# Patient Record
Sex: Male | Born: 1980 | Race: Black or African American | Hispanic: No | Marital: Single | Smoking: Current every day smoker
Health system: Southern US, Community
[De-identification: ages and names within clinical notes are randomized; demographics above are authoritative.]

## PROBLEM LIST (undated history)

## (undated) DIAGNOSIS — F329 Major depressive disorder, single episode, unspecified: Secondary | ICD-10-CM

## (undated) DIAGNOSIS — D649 Anemia, unspecified: Secondary | ICD-10-CM

## (undated) DIAGNOSIS — F32A Depression, unspecified: Secondary | ICD-10-CM

## (undated) DIAGNOSIS — F603 Borderline personality disorder: Secondary | ICD-10-CM

## (undated) DIAGNOSIS — S31119A Laceration without foreign body of abdominal wall, unspecified quadrant without penetration into peritoneal cavity, initial encounter: Secondary | ICD-10-CM

## (undated) DIAGNOSIS — Z87821 Personal history of retained foreign body fully removed: Secondary | ICD-10-CM

## (undated) DIAGNOSIS — F1410 Cocaine abuse, uncomplicated: Secondary | ICD-10-CM

## (undated) DIAGNOSIS — Z9289 Personal history of other medical treatment: Secondary | ICD-10-CM

## (undated) DIAGNOSIS — F419 Anxiety disorder, unspecified: Secondary | ICD-10-CM

## (undated) HISTORY — PX: FOREIGN BODY REMOVAL: SHX962

---
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
## 2009-06-19 HISTORY — PX: EXPLORATORY LAPAROTOMY: SUR591

## 2009-06-19 HISTORY — PX: GASTROTOMY: SHX61

## 2009-06-21 ENCOUNTER — Emergency Department (HOSPITAL_COMMUNITY): Admission: EM | Admit: 2009-06-21 | Discharge: 2009-06-21 | Payer: Self-pay | Admitting: Emergency Medicine

## 2009-08-03 ENCOUNTER — Emergency Department (HOSPITAL_COMMUNITY): Admission: EM | Admit: 2009-08-03 | Discharge: 2009-08-03 | Payer: Self-pay | Admitting: Emergency Medicine

## 2009-08-28 ENCOUNTER — Emergency Department (HOSPITAL_COMMUNITY): Admission: EM | Admit: 2009-08-28 | Discharge: 2009-08-30 | Payer: Self-pay | Admitting: Emergency Medicine

## 2009-08-30 ENCOUNTER — Inpatient Hospital Stay (HOSPITAL_COMMUNITY): Admission: RE | Admit: 2009-08-30 | Discharge: 2009-09-07 | Payer: Self-pay | Admitting: Psychiatry

## 2009-08-30 ENCOUNTER — Ambulatory Visit: Payer: Self-pay | Admitting: Psychiatry

## 2009-10-02 ENCOUNTER — Emergency Department (HOSPITAL_COMMUNITY): Admission: EM | Admit: 2009-10-02 | Discharge: 2009-10-04 | Payer: Self-pay | Admitting: Emergency Medicine

## 2009-11-16 ENCOUNTER — Emergency Department (HOSPITAL_COMMUNITY): Admission: EM | Admit: 2009-11-16 | Discharge: 2009-11-16 | Payer: Self-pay | Admitting: Emergency Medicine

## 2010-03-31 ENCOUNTER — Inpatient Hospital Stay (HOSPITAL_COMMUNITY): Admission: EM | Admit: 2010-03-31 | Discharge: 2010-04-03 | Payer: Self-pay | Admitting: Emergency Medicine

## 2010-04-11 ENCOUNTER — Inpatient Hospital Stay (HOSPITAL_COMMUNITY): Admission: EM | Admit: 2010-04-11 | Discharge: 2010-04-18 | Payer: Self-pay | Admitting: Emergency Medicine

## 2010-05-10 ENCOUNTER — Ambulatory Visit: Payer: Self-pay | Admitting: Psychiatry

## 2010-05-14 ENCOUNTER — Ambulatory Visit: Payer: Self-pay | Admitting: Psychiatry

## 2010-08-31 LAB — BASIC METABOLIC PANEL
BUN: 11 mg/dL (ref 6–23)
BUN: 12 mg/dL (ref 6–23)
BUN: 2 mg/dL — ABNORMAL LOW (ref 6–23)
BUN: 3 mg/dL — ABNORMAL LOW (ref 6–23)
BUN: 8 mg/dL (ref 6–23)
CO2: 27 mEq/L (ref 19–32)
CO2: 28 mEq/L (ref 19–32)
CO2: 29 mEq/L (ref 19–32)
CO2: 33 mEq/L — ABNORMAL HIGH (ref 19–32)
CO2: 33 mEq/L — ABNORMAL HIGH (ref 19–32)
Calcium: 8.8 mg/dL (ref 8.4–10.5)
Calcium: 9 mg/dL (ref 8.4–10.5)
Calcium: 9.1 mg/dL (ref 8.4–10.5)
Calcium: 9.1 mg/dL (ref 8.4–10.5)
Calcium: 9.3 mg/dL (ref 8.4–10.5)
Chloride: 101 mEq/L (ref 96–112)
Chloride: 103 mEq/L (ref 96–112)
Chloride: 104 mEq/L (ref 96–112)
Chloride: 105 mEq/L (ref 96–112)
Chloride: 98 mEq/L (ref 96–112)
Creatinine, Ser: 0.87 mg/dL (ref 0.4–1.5)
Creatinine, Ser: 1.11 mg/dL (ref 0.4–1.5)
Creatinine, Ser: 1.11 mg/dL (ref 0.4–1.5)
Creatinine, Ser: 1.18 mg/dL (ref 0.4–1.5)
Creatinine, Ser: 1.26 mg/dL (ref 0.4–1.5)
GFR calc Af Amer: >60 mL/min (ref >60)
GFR calc Af Amer: >60 mL/min (ref >60)
GFR calc Af Amer: >60 mL/min (ref >60)
GFR calc Af Amer: >60 mL/min (ref >60)
GFR calc Af Amer: >60 mL/min (ref >60)
GFR calc non Af Amer: >60 mL/min (ref >60)
GFR calc non Af Amer: >60 mL/min (ref >60)
GFR calc non Af Amer: >60 mL/min (ref >60)
GFR calc non Af Amer: >60 mL/min (ref >60)
GFR calc non Af Amer: >60 mL/min (ref >60)
Glucose, Bld: 103 mg/dL — ABNORMAL HIGH (ref 70–99)
Glucose, Bld: 104 mg/dL — ABNORMAL HIGH (ref 70–99)
Glucose, Bld: 116 mg/dL — ABNORMAL HIGH (ref 70–99)
Glucose, Bld: 92 mg/dL (ref 70–99)
Glucose, Bld: 98 mg/dL (ref 70–99)
Potassium: 3.8 mEq/L (ref 3.5–5.1)
Potassium: 4.3 mEq/L (ref 3.5–5.1)
Potassium: 4.3 mEq/L (ref 3.5–5.1)
Potassium: 4.4 mEq/L (ref 3.5–5.1)
Potassium: 4.5 mEq/L (ref 3.5–5.1)
Sodium: 137 mEq/L (ref 135–145)
Sodium: 137 mEq/L (ref 135–145)
Sodium: 138 mEq/L (ref 135–145)
Sodium: 139 mEq/L (ref 135–145)
Sodium: 141 mEq/L (ref 135–145)

## 2010-08-31 LAB — CBC
HCT: 35.9 % — ABNORMAL LOW (ref 39.0–52.0)
HCT: 42 % (ref 39.0–52.0)
HCT: 45.2 % (ref 39.0–52.0)
HCT: 45.2 % (ref 39.0–52.0)
HCT: 45.4 % (ref 39.0–52.0)
Hemoglobin: 12.2 g/dL — ABNORMAL LOW (ref 13.0–17.0)
Hemoglobin: 14.1 g/dL (ref 13.0–17.0)
Hemoglobin: 15 g/dL (ref 13.0–17.0)
Hemoglobin: 15.4 g/dL (ref 13.0–17.0)
Hemoglobin: 15.5 g/dL (ref 13.0–17.0)
MCH: 30.4 pg (ref 26.0–34.0)
MCH: 30.7 pg (ref 26.0–34.0)
MCH: 30.9 pg (ref 26.0–34.0)
MCH: 30.9 pg (ref 26.0–34.0)
MCH: 30.9 pg (ref 26.0–34.0)
MCHC: 33.2 g/dL (ref 30.0–36.0)
MCHC: 33.6 g/dL (ref 30.0–36.0)
MCHC: 34 g/dL (ref 30.0–36.0)
MCHC: 34.1 g/dL (ref 30.0–36.0)
MCHC: 34.1 g/dL (ref 30.0–36.0)
MCV: 90.2 fL (ref 78.0–100.0)
MCV: 90.6 fL (ref 78.0–100.0)
MCV: 90.8 fL (ref 78.0–100.0)
MCV: 91.5 fL (ref 78.0–100.0)
MCV: 91.9 fL (ref 78.0–100.0)
Platelets: 178 10*3/uL (ref 150–400)
Platelets: 186 10*3/uL (ref 150–400)
Platelets: 187 10*3/uL (ref 150–400)
Platelets: 192 10*3/uL (ref 150–400)
Platelets: 220 10*3/uL (ref 150–400)
RBC: 3.98 MIL/uL — ABNORMAL LOW (ref 4.22–5.81)
RBC: 4.57 MIL/uL (ref 4.22–5.81)
RBC: 4.94 MIL/uL (ref 4.22–5.81)
RBC: 4.98 MIL/uL (ref 4.22–5.81)
RBC: 5.01 MIL/uL (ref 4.22–5.81)
RDW: 12.3 % (ref 11.5–15.5)
RDW: 12.3 % (ref 11.5–15.5)
RDW: 12.3 % (ref 11.5–15.5)
RDW: 12.5 % (ref 11.5–15.5)
RDW: 12.6 % (ref 11.5–15.5)
WBC: 13.6 10*3/uL — ABNORMAL HIGH (ref 4.0–10.5)
WBC: 23.3 10*3/uL — ABNORMAL HIGH (ref 4.0–10.5)
WBC: 5.5 10*3/uL (ref 4.0–10.5)
WBC: 7.4 10*3/uL (ref 4.0–10.5)
WBC: 9.9 10*3/uL (ref 4.0–10.5)

## 2010-08-31 LAB — GLUCOSE, CAPILLARY: Glucose-Capillary: 120 mg/dL — ABNORMAL HIGH (ref 70–99)

## 2010-08-31 LAB — DIFFERENTIAL
Basophils Absolute: 0 10*3/uL (ref 0.0–0.1)
Basophils Relative: 0 % (ref 0–1)
Eosinophils Absolute: 0.1 10*3/uL (ref 0.0–0.7)
Eosinophils Relative: 1 % (ref 0–5)
Lymphocytes Relative: 16 % (ref 12–46)
Lymphs Abs: 1.6 10*3/uL (ref 0.7–4.0)
Monocytes Absolute: 0.8 10*3/uL (ref 0.1–1.0)
Monocytes Relative: 8 % (ref 3–12)
Neutro Abs: 7.5 10*3/uL (ref 1.7–7.7)
Neutrophils Relative %: 75 % (ref 43–77)

## 2010-08-31 LAB — MAGNESIUM
Magnesium: 1.9 mg/dL (ref 1.5–2.5)
Magnesium: 2 mg/dL (ref 1.5–2.5)

## 2010-08-31 LAB — MRSA PCR SCREENING: MRSA by PCR: NEGATIVE

## 2010-08-31 LAB — PHOSPHORUS: Phosphorus: 1.8 mg/dL — ABNORMAL LOW (ref 2.3–4.6)

## 2010-09-01 LAB — DIFFERENTIAL
Band Neutrophils: 0 % (ref 0–10)
Basophils Absolute: 0 10*3/uL (ref 0.0–0.1)
Basophils Relative: 0 % (ref 0–1)
Eosinophils Absolute: 0.2 10*3/uL (ref 0.0–0.7)
Eosinophils Relative: 2 % (ref 0–5)
Metamyelocytes Relative: 0 %
Myelocytes: 0 %
Promyelocytes Absolute: 0 %

## 2010-09-01 LAB — SAMPLE TO BLOOD BANK

## 2010-09-01 LAB — BASIC METABOLIC PANEL
CO2: 31 mEq/L (ref 19–32)
Chloride: 104 mEq/L (ref 96–112)
GFR calc Af Amer: >60 mL/min (ref >60)
Glucose, Bld: 93 mg/dL (ref 70–99)
Sodium: 143 mEq/L (ref 135–145)

## 2010-09-01 LAB — CBC
Hemoglobin: 16.2 g/dL (ref 13.0–17.0)
MCH: 30.7 pg (ref 26.0–34.0)
MCHC: 33.8 g/dL (ref 30.0–36.0)
MCV: 90.7 fL (ref 78.0–100.0)
RBC: 5.28 MIL/uL (ref 4.22–5.81)

## 2010-09-01 LAB — RAPID URINE DRUG SCREEN, HOSP PERFORMED
Amphetamines: NOT DETECTED
Barbiturates: NOT DETECTED
Cocaine: NOT DETECTED
Opiates: NOT DETECTED
Tetrahydrocannabinol: NOT DETECTED

## 2010-09-01 LAB — POCT I-STAT, CHEM 8
Glucose, Bld: 94 mg/dL (ref 70–99)
HCT: 52 % (ref 39.0–52.0)
Hemoglobin: 17.7 g/dL — ABNORMAL HIGH (ref 13.0–17.0)
Potassium: 4.1 mEq/L (ref 3.5–5.1)
Sodium: 143 mEq/L (ref 135–145)

## 2010-09-01 LAB — ABO/RH: ABO/RH(D): O POS

## 2010-09-06 LAB — RAPID URINE DRUG SCREEN, HOSP PERFORMED
Amphetamines: NOT DETECTED
Benzodiazepines: NOT DETECTED
Cocaine: POSITIVE — AB
Opiates: NOT DETECTED
Tetrahydrocannabinol: POSITIVE — AB

## 2010-09-06 LAB — DIFFERENTIAL
Basophils Absolute: 0 10*3/uL (ref 0.0–0.1)
Basophils Relative: 0 % (ref 0–1)
Eosinophils Relative: 2 % (ref 0–5)
Monocytes Absolute: 1.1 10*3/uL — ABNORMAL HIGH (ref 0.1–1.0)
Neutro Abs: 7.3 10*3/uL (ref 1.7–7.7)

## 2010-09-06 LAB — CBC
Hemoglobin: 14.5 g/dL (ref 13.0–17.0)
MCHC: 34.9 g/dL (ref 30.0–36.0)
RDW: 12.8 % (ref 11.5–15.5)

## 2010-09-06 LAB — URINALYSIS, ROUTINE W REFLEX MICROSCOPIC
Bilirubin Urine: NEGATIVE
Ketones, ur: NEGATIVE mg/dL
Nitrite: NEGATIVE
Specific Gravity, Urine: 1.024 (ref 1.005–1.030)
Urobilinogen, UA: 1 mg/dL (ref 0.0–1.0)

## 2010-09-06 LAB — BASIC METABOLIC PANEL
BUN: 6 mg/dL (ref 6–23)
CO2: 31 mEq/L (ref 19–32)
Calcium: 8.8 mg/dL (ref 8.4–10.5)
Glucose, Bld: 91 mg/dL (ref 70–99)
Sodium: 138 mEq/L (ref 135–145)

## 2010-09-08 LAB — URINALYSIS, ROUTINE W REFLEX MICROSCOPIC
Bilirubin Urine: NEGATIVE
Hgb urine dipstick: NEGATIVE
Protein, ur: NEGATIVE mg/dL
Urobilinogen, UA: 1 mg/dL (ref 0.0–1.0)

## 2010-09-08 LAB — GC/CHLAMYDIA PROBE AMP, GENITAL: Chlamydia, DNA Probe: NEGATIVE

## 2010-09-08 LAB — URINE MICROSCOPIC-ADD ON

## 2010-09-12 LAB — COMPREHENSIVE METABOLIC PANEL
ALT: 23 U/L (ref 0–53)
Calcium: 9 mg/dL (ref 8.4–10.5)
GFR calc Af Amer: >60 mL/min (ref >60)
Glucose, Bld: 111 mg/dL — ABNORMAL HIGH (ref 70–99)
Sodium: 135 mEq/L (ref 135–145)
Total Protein: 7.2 g/dL (ref 6.0–8.3)

## 2010-09-12 LAB — RAPID URINE DRUG SCREEN, HOSP PERFORMED
Barbiturates: NOT DETECTED
Cocaine: POSITIVE — AB
Opiates: NOT DETECTED

## 2010-09-12 LAB — HEPATIC FUNCTION PANEL
ALT: 19 U/L (ref 0–53)
AST: 23 U/L (ref 0–37)
Albumin: 4.7 g/dL (ref 3.5–5.2)
Alkaline Phosphatase: 94 U/L (ref 39–117)
Total Bilirubin: 0.5 mg/dL (ref 0.3–1.2)
Total Protein: 7.4 g/dL (ref 6.0–8.3)

## 2010-09-12 LAB — SALICYLATE LEVEL: Salicylate Lvl: <4 mg/dL (ref 2.8–20.0)

## 2010-09-12 LAB — ACETAMINOPHEN LEVEL: Acetaminophen (Tylenol), Serum: 84.3 ug/mL — ABNORMAL HIGH (ref 10–30)

## 2010-10-18 DIAGNOSIS — Z9289 Personal history of other medical treatment: Secondary | ICD-10-CM

## 2010-10-18 HISTORY — DX: Personal history of other medical treatment: Z92.89

## 2010-12-13 ENCOUNTER — Emergency Department (HOSPITAL_BASED_OUTPATIENT_CLINIC_OR_DEPARTMENT_OTHER)
Admission: EM | Admit: 2010-12-13 | Discharge: 2010-12-14 | Disposition: A | Discharge status: Home / Self Care | Attending: Emergency Medicine | Admitting: Emergency Medicine

## 2010-12-13 DIAGNOSIS — T189XXA Foreign body of alimentary tract, part unspecified, initial encounter: Secondary | ICD-10-CM | POA: Insufficient documentation

## 2010-12-13 DIAGNOSIS — Y9210 Unspecified residential institution as the place of occurrence of the external cause: Secondary | ICD-10-CM | POA: Insufficient documentation

## 2010-12-13 DIAGNOSIS — F17200 Nicotine dependence, unspecified, uncomplicated: Secondary | ICD-10-CM | POA: Insufficient documentation

## 2010-12-13 DIAGNOSIS — IMO0002 Reserved for concepts with insufficient information to code with codable children: Secondary | ICD-10-CM | POA: Insufficient documentation

## 2010-12-14 ENCOUNTER — Emergency Department (INDEPENDENT_AMBULATORY_CARE_PROVIDER_SITE_OTHER)

## 2010-12-14 DIAGNOSIS — T182XXA Foreign body in stomach, initial encounter: Secondary | ICD-10-CM

## 2010-12-14 DIAGNOSIS — IMO0002 Reserved for concepts with insufficient information to code with codable children: Secondary | ICD-10-CM

## 2018-03-30 ENCOUNTER — Encounter (HOSPITAL_COMMUNITY): Payer: Self-pay | Admitting: Emergency Medicine

## 2018-03-30 ENCOUNTER — Emergency Department (HOSPITAL_COMMUNITY): Payer: Self-pay

## 2018-03-30 ENCOUNTER — Other Ambulatory Visit: Payer: Self-pay

## 2018-03-30 ENCOUNTER — Inpatient Hospital Stay (HOSPITAL_COMMUNITY)
Admission: EM | Admit: 2018-03-30 | Discharge: 2018-04-12 | DRG: 917 | Disposition: A | Discharge status: Home / Self Care | Payer: Self-pay | Attending: Internal Medicine | Admitting: Internal Medicine

## 2018-03-30 DIAGNOSIS — R402222 Coma scale, best verbal response, incomprehensible words, at arrival to emergency department: Secondary | ICD-10-CM | POA: Diagnosis present

## 2018-03-30 DIAGNOSIS — G92 Toxic encephalopathy: Secondary | ICD-10-CM | POA: Diagnosis present

## 2018-03-30 DIAGNOSIS — X58XXXA Exposure to other specified factors, initial encounter: Secondary | ICD-10-CM | POA: Diagnosis present

## 2018-03-30 DIAGNOSIS — R402112 Coma scale, eyes open, never, at arrival to emergency department: Secondary | ICD-10-CM | POA: Diagnosis present

## 2018-03-30 DIAGNOSIS — Y92009 Unspecified place in unspecified non-institutional (private) residence as the place of occurrence of the external cause: Secondary | ICD-10-CM

## 2018-03-30 DIAGNOSIS — Z915 Personal history of self-harm: Secondary | ICD-10-CM

## 2018-03-30 DIAGNOSIS — F17210 Nicotine dependence, cigarettes, uncomplicated: Secondary | ICD-10-CM | POA: Diagnosis present

## 2018-03-30 DIAGNOSIS — H5704 Mydriasis: Secondary | ICD-10-CM | POA: Diagnosis present

## 2018-03-30 DIAGNOSIS — Z888 Allergy status to other drugs, medicaments and biological substances status: Secondary | ICD-10-CM

## 2018-03-30 DIAGNOSIS — F332 Major depressive disorder, recurrent severe without psychotic features: Secondary | ICD-10-CM | POA: Diagnosis present

## 2018-03-30 DIAGNOSIS — T183XXA Foreign body in small intestine, initial encounter: Secondary | ICD-10-CM | POA: Diagnosis present

## 2018-03-30 DIAGNOSIS — F419 Anxiety disorder, unspecified: Secondary | ICD-10-CM | POA: Diagnosis present

## 2018-03-30 DIAGNOSIS — G4700 Insomnia, unspecified: Secondary | ICD-10-CM | POA: Diagnosis present

## 2018-03-30 DIAGNOSIS — Z653 Problems related to other legal circumstances: Secondary | ICD-10-CM

## 2018-03-30 DIAGNOSIS — G9081 Serotonin syndrome: Secondary | ICD-10-CM | POA: Diagnosis present

## 2018-03-30 DIAGNOSIS — G2579 Other drug induced movement disorders: Secondary | ICD-10-CM | POA: Diagnosis present

## 2018-03-30 DIAGNOSIS — T426X2A Poisoning by other antiepileptic and sedative-hypnotic drugs, intentional self-harm, initial encounter: Secondary | ICD-10-CM | POA: Diagnosis present

## 2018-03-30 DIAGNOSIS — F1010 Alcohol abuse, uncomplicated: Secondary | ICD-10-CM | POA: Diagnosis present

## 2018-03-30 DIAGNOSIS — Z79899 Other long term (current) drug therapy: Secondary | ICD-10-CM

## 2018-03-30 DIAGNOSIS — T189XXA Foreign body of alimentary tract, part unspecified, initial encounter: Secondary | ICD-10-CM

## 2018-03-30 DIAGNOSIS — F1410 Cocaine abuse, uncomplicated: Secondary | ICD-10-CM | POA: Diagnosis present

## 2018-03-30 DIAGNOSIS — R402362 Coma scale, best motor response, obeys commands, at arrival to emergency department: Secondary | ICD-10-CM | POA: Diagnosis present

## 2018-03-30 DIAGNOSIS — R502 Drug induced fever: Secondary | ICD-10-CM | POA: Diagnosis present

## 2018-03-30 DIAGNOSIS — T50902A Poisoning by unspecified drugs, medicaments and biological substances, intentional self-harm, initial encounter: Secondary | ICD-10-CM | POA: Diagnosis present

## 2018-03-30 DIAGNOSIS — K210 Gastro-esophageal reflux disease with esophagitis: Secondary | ICD-10-CM | POA: Diagnosis present

## 2018-03-30 DIAGNOSIS — I10 Essential (primary) hypertension: Secondary | ICD-10-CM | POA: Diagnosis present

## 2018-03-30 DIAGNOSIS — N179 Acute kidney failure, unspecified: Secondary | ICD-10-CM | POA: Diagnosis present

## 2018-03-30 DIAGNOSIS — R61 Generalized hyperhidrosis: Secondary | ICD-10-CM | POA: Diagnosis present

## 2018-03-30 DIAGNOSIS — T43212A Poisoning by selective serotonin and norepinephrine reuptake inhibitors, intentional self-harm, initial encounter: Principal | ICD-10-CM | POA: Diagnosis present

## 2018-03-30 HISTORY — DX: Cocaine abuse, uncomplicated: F14.10

## 2018-03-30 LAB — I-STAT CHEM 8, ED
BUN: 6 mg/dL (ref 6–20)
CHLORIDE: 103 mmol/L (ref 98–111)
Calcium, Ion: 1.12 mmol/L — ABNORMAL LOW (ref 1.15–1.40)
Creatinine, Ser: 1.2 mg/dL (ref 0.61–1.24)
Glucose, Bld: 111 mg/dL — ABNORMAL HIGH (ref 70–99)
HEMATOCRIT: 47 % (ref 39.0–52.0)
Hemoglobin: 16 g/dL (ref 13.0–17.0)
POTASSIUM: 3.9 mmol/L (ref 3.5–5.1)
Sodium: 140 mmol/L (ref 135–145)
TCO2: 26 mmol/L (ref 22–32)

## 2018-03-30 LAB — I-STAT ARTERIAL BLOOD GAS, ED
ACID-BASE EXCESS: 1 mmol/L (ref 0.0–2.0)
BICARBONATE: 26.2 mmol/L (ref 20.0–28.0)
O2 Saturation: 93 %
PH ART: 7.357 (ref 7.350–7.450)
Patient temperature: 101.1
TCO2: 28 mmol/L (ref 22–32)
pCO2 arterial: 47.3 mmHg (ref 32.0–48.0)
pO2, Arterial: 75 mmHg — ABNORMAL LOW (ref 83.0–108.0)

## 2018-03-30 LAB — CBC WITH DIFFERENTIAL/PLATELET
Abs Immature Granulocytes: 0.01 10*3/uL (ref 0.00–0.07)
BASOS PCT: 1 %
Basophils Absolute: 0 10*3/uL (ref 0.0–0.1)
EOS ABS: 0.1 10*3/uL (ref 0.0–0.5)
Eosinophils Relative: 2 %
HCT: 48.4 % (ref 39.0–52.0)
Hemoglobin: 14.7 g/dL (ref 13.0–17.0)
IMMATURE GRANULOCYTES: 0 %
Lymphocytes Relative: 15 %
Lymphs Abs: 1.1 10*3/uL (ref 0.7–4.0)
MCH: 26.8 pg (ref 26.0–34.0)
MCHC: 30.4 g/dL (ref 30.0–36.0)
MCV: 88.3 fL (ref 80.0–100.0)
MONO ABS: 0.5 10*3/uL (ref 0.1–1.0)
MONOS PCT: 7 %
NEUTROS PCT: 75 %
Neutro Abs: 5.4 10*3/uL (ref 1.7–7.7)
PLATELETS: 305 10*3/uL (ref 150–400)
RBC: 5.48 MIL/uL (ref 4.22–5.81)
RDW: 13.5 % (ref 11.5–15.5)
WBC: 7.2 10*3/uL (ref 4.0–10.5)
nRBC: 0 % (ref 0.0–0.2)

## 2018-03-30 LAB — COMPREHENSIVE METABOLIC PANEL
ALT: 20 U/L (ref 0–44)
AST: 24 U/L (ref 15–41)
Albumin: 4.3 g/dL (ref 3.5–5.0)
Alkaline Phosphatase: 67 U/L (ref 38–126)
Anion gap: 11 (ref 5–15)
BUN: 6 mg/dL (ref 6–20)
CHLORIDE: 104 mmol/L (ref 98–111)
CO2: 25 mmol/L (ref 22–32)
Calcium: 9.5 mg/dL (ref 8.9–10.3)
Creatinine, Ser: 1.28 mg/dL — ABNORMAL HIGH (ref 0.61–1.24)
Glucose, Bld: 108 mg/dL — ABNORMAL HIGH (ref 70–99)
POTASSIUM: 3.8 mmol/L (ref 3.5–5.1)
SODIUM: 140 mmol/L (ref 135–145)
Total Bilirubin: 0.8 mg/dL (ref 0.3–1.2)
Total Protein: 7.3 g/dL (ref 6.5–8.1)

## 2018-03-30 LAB — ETHANOL

## 2018-03-30 LAB — ACETAMINOPHEN LEVEL

## 2018-03-30 LAB — SALICYLATE LEVEL

## 2018-03-30 LAB — CBG MONITORING, ED: Glucose-Capillary: 108 mg/dL — ABNORMAL HIGH (ref 70–99)

## 2018-03-30 MED ORDER — LORAZEPAM 2 MG/ML IJ SOLN
INTRAMUSCULAR | Status: AC
  Administered 2018-03-30: 0.5 mg via INTRAVENOUS
  Filled 2018-03-30: qty 1

## 2018-03-30 MED ORDER — SODIUM CHLORIDE 0.9 % IV BOLUS
1000.0000 mL | Freq: Once | INTRAVENOUS | Status: AC
  Administered 2018-03-30: 1000 mL via INTRAVENOUS

## 2018-03-30 MED ORDER — LORAZEPAM 2 MG/ML IJ SOLN
0.5000 mg | Freq: Once | INTRAMUSCULAR | Status: AC
  Administered 2018-03-30: 0.5 mg via INTRAVENOUS

## 2018-03-30 MED ORDER — ONDANSETRON HCL 4 MG/2ML IJ SOLN
INTRAMUSCULAR | Status: AC
  Filled 2018-03-30: qty 2

## 2018-03-30 NOTE — ED Notes (Signed)
RT at bedside for ABG

## 2018-03-30 NOTE — ED Triage Notes (Signed)
Pt arrived [facility] from home for overdose. Per EMS family states that he took 20- 100mg  Lamictal, 6-150mg  effexor, and cocaine at approx 6am today. Pt began shaking an hour ago and family called EMS, Upon arrival pt is hyperventilating, diaphoretic, and responds to voice.  Vitals with EMS, 139/91    P 104 RR30   O2%97 NRB 20G IV L hand

## 2018-03-30 NOTE — ED Provider Notes (Addendum)
[facility] EMERGENCY DEPARTMENT Provider Note   CSN: [id number] Arrival date & time: 03/30/18  2250     History   Chief Complaint Chief Complaint  Patient presents with  . Drug Overdose    HPI David Mcconnell is a 37 y.o. male.  HPI   This is a 37 yo male with history of cocaine abuse who presents with a reported overdose.  Per EMS report, they were called out to the patient's home for a possible overdose.  Per EMS, family stated that the patient took 2100 mg Lamictal and 6 150 mg Effexor as well as cocaine at approximately 6 AM this morning.  He did make statements to his family that implicated intention for suicide.  1 hour prior to arrival, family reported shaking by the patient.  Upon arrival he was hyperventilating, diaphoretic.  He was responsive to voice.  Noted to be tachycardic but otherwise vital signs were largely reassuring.  Patient does not contribute to history taking.  He does follow simple commands.  When asked if he took anything else he just moans.  Level 5 caveat  Past Medical History:  Diagnosis Date  . Cocaine abuse (HCC)     There are no active problems to display for this patient.   History reviewed. No pertinent surgical history.      Home Medications    Prior to Admission medications   Not on File    Family History History reviewed. No pertinent family history.  Social History Social History   Tobacco Use  . Smoking status: Current Some Day Smoker  . Smokeless tobacco: Never Used  Substance Use Topics  . Alcohol use: Not Currently  . Drug use: Yes    Types: Cocaine     Allergies   Patient has no known allergies.   Review of Systems Review of Systems  Unable to perform ROS: Mental status change     Physical Exam Updated Vital Signs BP 118/79   Pulse 74   Temp 98.5 F (36.9 C) (Oral)   Resp 19   Ht 1.829 m (6')   Wt 99.8 kg   SpO2 96%   BMI 29.84 kg/m   Physical Exam  Constitutional: He  appears well-developed and well-nourished.  Ill-appearing, somnolent, minimally arousable  HENT:  Head: Normocephalic and atraumatic.  Eyes: Pupils are equal, round, and reactive to light.  Pupils 4 mm reactive bilaterally  Neck: Neck supple.  Cardiovascular: Normal rate, regular rhythm and normal heart sounds.  No murmur heard. Pulmonary/Chest: Effort normal and breath sounds normal. No respiratory distress. He has no wheezes.  Abdominal: Soft. Bowel sounds are normal. There is no tenderness. There is no rebound.  Extensive abdominal scarring  Musculoskeletal: He exhibits no edema.  Neurological: He is alert. GCS eye subscore is 1. GCS verbal subscore is 2. GCS motor subscore is 6.  Skin: Skin is warm. He is diaphoretic.  Psychiatric: He has a normal mood and affect.  Nursing note and vitals reviewed.    ED Treatments / Results  Labs (all labs ordered are listed, but only abnormal results are displayed) Labs Reviewed  COMPREHENSIVE METABOLIC PANEL - Abnormal; Notable for the following components:      Result Value   Glucose, Bld 108 (*)    Creatinine, Ser 1.28 (*)    All other components within normal limits  ACETAMINOPHEN LEVEL - Abnormal; Notable for the following components:   Acetaminophen (Tylenol), Serum <10 (*)    All other components  within normal limits  RAPID URINE DRUG SCREEN, HOSP PERFORMED - Abnormal; Notable for the following components:   Cocaine POSITIVE (*)    All other components within normal limits  I-STAT ARTERIAL BLOOD GAS, ED - Abnormal; Notable for the following components:   pO2, Arterial 75.0 (*)    All other components within normal limits  I-STAT CHEM 8, ED - Abnormal; Notable for the following components:   Glucose, Bld 111 (*)    Calcium, Ion 1.12 (*)    All other components within normal limits  CBG MONITORING, ED - Abnormal; Notable for the following components:   Glucose-Capillary 108 (*)    All other components within normal limits  CBC  WITH DIFFERENTIAL/PLATELET  SALICYLATE LEVEL  ETHANOL    EKG None  Radiology Dg Abdomen 1 View  Result Date: 03/30/2018 CLINICAL DATA:  Vomiting.  Drug overdose. EXAM: ABDOMEN - 1 VIEW COMPARISON:  04/11/2010 FINDINGS: No evidence of dilated bowel loops. No radiopaque calculi identified. A small linear radiodensity is seen in the right upper quadrant, which is of uncertain etiology. Ingested foreign body cannot be excluded. IMPRESSION: Small linear radiodensity in the right upper quadrant of uncertain etiology. Ingested foreign body cannot be excluded. Electronically Signed   By: Myles Rosenthal M.D.   On: 03/30/2018 23:35   Dg Chest Portable 1 View  Result Date: 03/30/2018 CLINICAL DATA:  Drug overdose.  Vomiting. EXAM: PORTABLE CHEST 1 VIEW COMPARISON:  04/11/2010 FINDINGS: The heart size and mediastinal contours are within normal limits. Both lungs are clear. The visualized skeletal structures are unremarkable. IMPRESSION: No active disease. Electronically Signed   By: Myles Rosenthal M.D.   On: 03/30/2018 23:33    Procedures Procedures (including critical care time)  CRITICAL CARE Performed by: Shon Baton   Total critical care time: 40 minutes  Critical care time was exclusive of separately billable procedures and treating other patients.  Critical care was necessary to treat or prevent imminent or life-threatening deterioration.  Critical care was time spent personally by me on the following activities: development of treatment plan with patient and/or surrogate as well as nursing, discussions with consultants, evaluation of patient's response to treatment, examination of patient, obtaining history from patient or surrogate, ordering and performing treatments and interventions, ordering and review of laboratory studies, ordering and review of radiographic studies, pulse oximetry and re-evaluation of patient's condition.   Medications Ordered in ED Medications  ondansetron  (ZOFRAN) 4 MG/2ML injection (has no administration in time range)  sodium chloride 0.9 % bolus 1,000 mL (0 mLs Intravenous Stopped 03/31/18 0050)  LORazepam (ATIVAN) injection 0.5 mg (0.5 mg Intravenous Given 03/30/18 2341)     Initial Impression / Assessment and Plan / ED Course  I have reviewed the triage vital signs and the nursing notes.  Pertinent labs & imaging results that were available during my care of the patient were reviewed by me and considered in my medical decision making (see chart for details).  Clinical Course as of Mar 31 721  Wynelle Link Mar 31, 2018  1610 More arousable.  Able to answer simple functions.  Will p.o. challenge and ambulate.  Will psychiatry evaluation.   [CH]    Clinical Course User Index [CH] Sadira Standard, Mayer Masker, MD    Patient presents with a overdose.  Reported at 6 AM yesterday morning.  He is minimally arousable.  He does follow some commands.  He is diaphoretic, tachycardic.  Patient was given a low-dose benzodiazepine as he appears to be  have some sympathetic drive.  Lab work-up is largely unremarkable.  Slight AKI.  Tylenol and salicylate levels are negative.  Poison control recommends 6-hour observation.  Patient was observed for greater than 8 hours.  On my repeat evaluation, he is more arousable.  He does not further endorse any ingestions.  He is answering questions appropriately but at times will moan and start shaking.  No actual seizure activity.  I have requested nursing ambulate him and give him something to eat for breakfast.  He is clinically cleared for TTS evaluation.  7:51 AM She is signed out to my colleague.  After my initial evaluation of waking him up, he is now again diaphoretic.  His vital signs however are fairly normal.  Repeat temperature 98.5.  Added head CT.  TTS consult will be deferred until fully cleared.  While he would certainly be at risk for serotonin syndrome, he is not showing the full toxidrome; however, he did remain  stable with a small dose of benzodiazepine overnight.  He may need admission if he does not clear.  Final Clinical Impressions(s) / ED Diagnoses   Final diagnoses:  Intentional drug overdose, initial encounter [facility])    ED Discharge Orders    None       Makynli Stills, Mayer Masker, MD 03/31/18 1610    Shon Baton, MD 03/31/18 9604    Shon Baton, MD 03/31/18 [phone]

## 2018-03-31 ENCOUNTER — Emergency Department (HOSPITAL_COMMUNITY): Payer: Self-pay

## 2018-03-31 ENCOUNTER — Other Ambulatory Visit: Payer: Self-pay

## 2018-03-31 DIAGNOSIS — X58XXXA Exposure to other specified factors, initial encounter: Secondary | ICD-10-CM

## 2018-03-31 DIAGNOSIS — X838XXA Intentional self-harm by other specified means, initial encounter: Secondary | ICD-10-CM

## 2018-03-31 DIAGNOSIS — T43212A Poisoning by selective serotonin and norepinephrine reuptake inhibitors, intentional self-harm, initial encounter: Principal | ICD-10-CM

## 2018-03-31 DIAGNOSIS — T189XXA Foreign body of alimentary tract, part unspecified, initial encounter: Secondary | ICD-10-CM

## 2018-03-31 DIAGNOSIS — F17200 Nicotine dependence, unspecified, uncomplicated: Secondary | ICD-10-CM

## 2018-03-31 DIAGNOSIS — G92 Toxic encephalopathy: Secondary | ICD-10-CM

## 2018-03-31 DIAGNOSIS — F1410 Cocaine abuse, uncomplicated: Secondary | ICD-10-CM

## 2018-03-31 DIAGNOSIS — Z915 Personal history of self-harm: Secondary | ICD-10-CM

## 2018-03-31 DIAGNOSIS — T50902A Poisoning by unspecified drugs, medicaments and biological substances, intentional self-harm, initial encounter: Secondary | ICD-10-CM | POA: Diagnosis present

## 2018-03-31 DIAGNOSIS — G2579 Other drug induced movement disorders: Secondary | ICD-10-CM | POA: Diagnosis present

## 2018-03-31 LAB — MAGNESIUM: Magnesium: 2.1 mg/dL (ref 1.7–2.4)

## 2018-03-31 LAB — RAPID URINE DRUG SCREEN, HOSP PERFORMED
Amphetamines: NOT DETECTED
BENZODIAZEPINES: NOT DETECTED
Barbiturates: NOT DETECTED
Cocaine: POSITIVE — AB
Opiates: NOT DETECTED
Tetrahydrocannabinol: NOT DETECTED

## 2018-03-31 LAB — I-STAT VENOUS BLOOD GAS, ED
ACID-BASE EXCESS: 4 mmol/L — AB (ref 0.0–2.0)
BICARBONATE: 29.9 mmol/L — AB (ref 20.0–28.0)
O2 SAT: 88 %
PCO2 VEN: 46.7 mmHg (ref 44.0–60.0)
PO2 VEN: 55 mmHg — AB (ref 32.0–45.0)
TCO2: 31 mmol/L (ref 22–32)
pH, Ven: 7.414 (ref 7.250–7.430)

## 2018-03-31 LAB — RAPID HIV SCREEN (HIV 1/2 AB+AG)
HIV 1/2 ANTIBODIES: NONREACTIVE
HIV-1 P24 ANTIGEN - HIV24: NONREACTIVE

## 2018-03-31 LAB — TSH: TSH: 0.351 u[IU]/mL (ref 0.350–4.500)

## 2018-03-31 LAB — CK: Total CK: 107 U/L (ref 49–397)

## 2018-03-31 LAB — PHOSPHORUS: Phosphorus: 3.8 mg/dL (ref 2.5–4.6)

## 2018-03-31 LAB — AMMONIA: Ammonia: 29 umol/L (ref 9–35)

## 2018-03-31 MED ORDER — ACETAMINOPHEN 650 MG RE SUPP: 650.0000 mg | Freq: Four times a day (QID) | RECTAL | Status: DC | PRN

## 2018-03-31 MED ORDER — ONDANSETRON HCL 4 MG PO TABS
4.0000 mg | ORAL_TABLET | Freq: Four times a day (QID) | ORAL | Status: DC | PRN
  Administered 2018-04-01: 4 mg via ORAL
  Filled 2018-03-31: qty 1

## 2018-03-31 MED ORDER — ACETAMINOPHEN 325 MG PO TABS
650.0000 mg | ORAL_TABLET | Freq: Four times a day (QID) | ORAL | Status: DC | PRN
  Administered 2018-04-08: 650 mg via ORAL
  Filled 2018-03-31: qty 2

## 2018-03-31 MED ORDER — LORAZEPAM 2 MG/ML IJ SOLN
1.0000 mg | Freq: Once | INTRAMUSCULAR | Status: AC
  Administered 2018-03-31: 1 mg via INTRAVENOUS
  Filled 2018-03-31: qty 1

## 2018-03-31 MED ORDER — SODIUM CHLORIDE 0.9 % IV BOLUS
1000.0000 mL | Freq: Once | INTRAVENOUS | Status: AC
  Administered 2018-03-31: 1000 mL via INTRAVENOUS

## 2018-03-31 MED ORDER — SODIUM CHLORIDE 0.9 % IV BOLUS (SEPSIS)
1000.0000 mL | Freq: Once | INTRAVENOUS | Status: AC
  Administered 2018-03-31: 1000 mL via INTRAVENOUS

## 2018-03-31 MED ORDER — ENOXAPARIN SODIUM 40 MG/0.4ML ~~LOC~~ SOLN
40.0000 mg | SUBCUTANEOUS | Status: DC
  Administered 2018-03-31 – 2018-04-08 (×9): 40 mg via SUBCUTANEOUS
  Filled 2018-03-31 (×9): qty 0.4

## 2018-03-31 MED ORDER — ONDANSETRON HCL 4 MG/2ML IJ SOLN
4.0000 mg | Freq: Four times a day (QID) | INTRAMUSCULAR | Status: DC | PRN
  Administered 2018-03-31: 4 mg via INTRAVENOUS
  Filled 2018-03-31: qty 2

## 2018-03-31 MED ORDER — LORAZEPAM 2 MG/ML IJ SOLN: 2.0000 mg | INTRAMUSCULAR | Status: DC | PRN

## 2018-03-31 MED ORDER — SODIUM CHLORIDE 0.9 % IV SOLN
1000.0000 mL | INTRAVENOUS | Status: DC
Start: 2018-03-31 — End: 2018-04-01
  Administered 2018-03-31 – 2018-04-01 (×4): 1000 mL via INTRAVENOUS

## 2018-03-31 NOTE — ED Provider Notes (Signed)
Patient accepted at sign out from Dr. Wilkie Aye. Patient with polysubstance overdoses as per her note. He was observed for 8 hours and seemed to be showing improvement with plan to consult TTS. However he now is again profusely diaphoretic and vomiting (07:40). He doesn't answer questions except with a moan sound. He does appear to have some situational awareness but very confused and agitated. He can help to roll on his side for exam and open his eyes at command. At times he exhibits some large scale shaking that is consistent with agitation but does not look like seizure activity. Patients appearance suggestive of agitated delerium. Rectal temp done, now 97.7. HR 70s SR. Mild tachypnea.   Possible Serotonin Syndtome. Will get CT head, add ammonia, CK, venous pH. Continue fluids and PRN benzo. Plan to admit. Physical Exam  BP 118/79   Pulse 74   Temp 98.5 F (36.9 C) (Oral)   Resp 19   Ht 6' (1.829 m)   Wt 99.8 kg   SpO2 96%   BMI 29.84 kg/m   Physical Exam  Constitutional:  Patient is profusely diaphoretic.  Mild tachypnea but no respiratory distress.  Nourished well-developed.  HENT:  Head: Normocephalic and atraumatic.  Mouth/Throat: Oropharynx is clear and moist.  Eyes:  Pupils are midrange and symmetric approximately 4 mm.  Sluggish response to light.  Ocular motions are coordinated.  Cardiovascular: Normal rate, regular rhythm, normal heart sounds and intact distal pulses.  Pulmonary/Chest:  Mild tachypnea but no respiratory distress.  Lung sounds are clear.  Abdominal: Soft.  Abdomen has large old surgical scars that are well-healed.  Abdomen is flat with no distention.  No guarding.  Genitourinary:  Genitourinary Comments: General appearance of male genitalia is normal without any evident swelling or local trauma.  Rectal exam: The rectal vault is empty.  Musculoskeletal:  No apparent extremity injuries.  No deformities.  No abrasions or contusions evident.  Good muscular  development.  In process of doing examination to be in the room patient does move each extremity.  Neurological:  Patient is confused and somnolent but agitated.  He does seem to comprehend some commands.  He responds to questions with a vague moan or grunt.  The timing of response is correct.  During the course of my evaluation he did not annunciate any distinct words.  He did exhibit ability to move all 4 extremities purposely.  Patellar reflexes checked and 2-3+.  Skin:  Skin is warm and profusely diaphoretic.     ED Course/Procedures   Clinical Course as of Apr 13 1824  Wynelle Link Mar 31, 2018  6440 More arousable.  Able to answer simple functions.  Will p.o. challenge and ambulate.  Will psychiatry evaluation.   [CH]  [id number] Order placed for consult to unassigned medicine.   [MP]  1007 Consult: Internal medicine teaching has returned call.  Will assess patient in the emergency department for admission.   [MP]    Clinical Course User Index [CH] Horton, Mayer Masker, MD [MP] Arby Barrette, MD    Procedures CRITICAL CARE Performed by: Arby Barrette   Total critical care time:45 minutes  Critical care time was exclusive of separately billable procedures and treating other patients.  Critical care was necessary to treat or prevent imminent or life-threatening deterioration.  Critical care was time spent personally by me on the following activities: development of treatment plan with patient and/or surrogate as well as nursing, discussions with consultants, evaluation of patient's response to treatment, examination of patient,  obtaining history from patient or surrogate, ordering and performing treatments and interventions, ordering and review of laboratory studies, ordering and review of radiographic studies, pulse oximetry and re-evaluation of patient's condition. MDM  See above      Arby Barrette, MD 04/13/18 [phone]

## 2018-03-31 NOTE — H&P (Signed)
Date: 03/31/2018               Patient Name:  David Mcconnell MRN: [id number]  DOB: 05/28/1981 Age / Sex: 37 y.o., male   PCP: Patient, No Pcp Per         Medical Service: Internal Medicine Teaching Service         Attending Physician: Dr. Arby Barrette, MD    First Contact: Dr. Gwyneth Revels Pager: [phone]  Second Contact: Dr. Caron Presume Pager: [phone]       After Hours (After 5p/  First Contact Pager: [phone]  weekends / holidays): Second Contact Pager: [phone]   Chief Complaint: Unresponsiveness  History of Present Illness: This is a 37 year old male with a history of cocaine abuse who presented via EMS after being found by his mother to be unresponsive.  He is accompanied by his mother who provides all the history.  She reports that yesterday morning around 6 AM took an estimated 20 tablets of 100 mg Lamictal, 6 150 mg explored, and cocaine around this time. He used his girlfriends medications. He was less responsive at that time but then threw up and spoke a bit however his speech was mumbled and was not clear.  He went to bed and the family thought that he would just sleep it off.  At 10 PM last night he started shaking a lot so family called EMS.  She reports that he has had multiple attempts of hurting himself and has had 17 abdominal operations.  There is one in 2011 after he ingested multiple toothbrushes and toothpaste.  [name] had been incarcerated until about 2 to 3 months ago.  The mother reports that he had been prescribed an antidepressant while he was in the jail that he is not taking any medications.  She is not aware of other substance that he uses.  He is currently living with his mother and father.  In the ED patient was found to be febrile to 101.1 on rectal temp, tachypneic up to 32, O2 saturation of 93%, tachycardic 109, and hypertensive up to 150/100.  Venous blood glass showed pH 7.41, PCO2 46.7, PO2 55, and bicarb 29.9.  ABG showed pH 7.35, PCO2 47.3, PO2 75,  bicarb 26.2.  Ammonia, CK, TSH, mag and phosphorus WNL.  Acetaminophen, folate, and ethanol were WNL.  CBC WNL.  CMP showed mild AKI with creatinine 1.28 unknown baseline (last Cr was in 2011 ranged from 0.87-1.18). UDS was positive for cocaine.  CT head showed no acute findings.  Chest x-ray showed no acute cardiopulmonary findings.  Abdominal x-ray showed small linear radiodensity in the right upper quadrant, ingested foreign body cannot be excluded.  Poison control was called and they recommend observation.  Patient was given 2 L normal saline and 1.5 mg Ativan.  After receiving the first milligram of Ativan he apparently became more arousable and was given a sandwich and a soda.  After eating sandwich he vomited again.  Patient became arousable again.  There was concern for him being at risk for serotonin syndrome. Patient was admitted to internal medicine.   Meds:  No outpatient medications have been marked as taking for the 03/30/18 encounter [facility] Encounter).     Allergies: Allergies as of 03/30/2018  . (No Known Allergies)   Past Medical History:  Diagnosis Date  . Cocaine abuse (HCC)     Family History: Mother denies any family history of any psychiatric disorders such as depression, bipolar, anxiety, schizophrenia,  or other conditions.  Social History: He has a history of cocaine use, mother reports that he drinks but is unable to quantify it (she thinks that it's beer), also reports that he smokes.  He has had issues with drug use the ninth grade.  He was last out about 2 to 3 months ago and is currently living with his mother and father.  Review of Systems: A complete ROS was negative except as per HPI.  Review of Systems  Unable to perform ROS: Mental status change     Physical Exam: Blood pressure 118/83, pulse 70, temperature 97.9 F (36.6 C), temperature source Rectal, resp. rate 13, height 6' (1.829 m), weight 99.8 kg, SpO2 98 %. Physical Exam  Constitutional:    Arousable only to painful stimuli, writhing around in bed intermittently, no obvious signs of distress  HENT:  Head: Normocephalic and atraumatic.  Eyes:  Bilateral pupillary dilation with sluggish reactiveness to light, no conjunctival injection or scleral icterus  Neck: Neck supple. No thyromegaly present.  Cardiovascular: Normal rate, regular rhythm and normal heart sounds.  Pulmonary/Chest: Effort normal and breath sounds normal. No respiratory distress. He has no wheezes. He has no rales.  Patient appears to be protecting his airway.   Abdominal: Soft. Bowel sounds are normal. He exhibits no distension. There is tenderness. There is no guarding.  Multiple surgical scars, midline vertical scar about 2 cm in width and 10 cm in height  Musculoskeletal:  No muscle rigidity or LE edema. Left ankle bracelet.  Neurological:  Patient was not oriented, he did respond to questions with some moaning intermittently, and was able to follow some commands like opening his eyes and mouth. Patient had bilateral dilated pupils, around 4 mm, that had sluggish reactions to light. CN, sensory, and motor tests were unable to be performed. Reflexes were 3+ in the patella's, otherwise 2+.    Skin: Skin is warm. No rash noted. He is diaphoretic. No erythema.    EKG: personally reviewed my interpretation is rate of 82 bpm, normal sinus rhythm, LVH, normal QRS and QTc, no ST elevations or depression  CXR: personally reviewed my interpretation is AP portable film, trachea midline, no cardiomegaly, no obvious bone fracture, no areas of consolidation or edema  DG Abd 1 View: IMPRESSION: Small linear radiodensity in the right upper quadrant of uncertain etiology. Ingested foreign body cannot be excluded.  CT Head WO contrast IMPRESSION: Negative CT head  Assessment & Plan by Problem:  This is a 37 year old male with history of cocaine abuse and multiple suicidal attempts presenting after  consuming  Lamictal, Effexor, using cocaine.  He is currently hemodynamically stable and 24 hours after the ingestion.  There is some concern that this could be serotonin syndrome, we will continue to monitor and treat as appropriate.  Altered mental status: Patient had recently ingested a cocktail of medications, including Lamictal and Effexor, which have contributed to his altered mental status. He ingested it about 24 hours prior to admission and had some improvement in his mental status earlier in the day however has become less responsive when we saw him. Given his history of suicidal attempts there is a concern that this was another attempt however we are unable to assess that due to his unresponsiveness at the moment. On physical exam his pupils were dilated and sluggish, diaphoretic, and had some hyperreflexia, however he had no muscle rigidity, normoactive bowel sounds, and no tremors. This could be a serotonin syndrome given her physical exam findings,  episodes of vomiting, the ingestion of Effexor, and the vitals on admission showing tachycardia, hypertension and a temp of 101, however he did not have any muscle rigidity, tremors, or hyperactive bowel sounds. This could also be related to his cocaine use which could cause similar physical exam findings. He has been receiving supportive care in the ER and his vitals have normalized. He is also maintaining his airway well. We will continue supportive management and monitor for worsening symptoms.  -CIWA protocol with Ativan -Telemetry -Continue IV NS at 125 mL/hr -NPO until patient becomes more alert -Zofran PRN -Psych consult for likely suicidal attempt -Suicide precautions  Possible ingested foreign body: CT scan showed a small linear hypodensity in the RUQ, ingested foreign body could not be ruled out. The mother did not mention any other ingestions that she knows of and we are unable to obtain information from the patient.  -Repeat Abdominal X-ray  tomorrow to monitor passage -Obtain more information from patient when he is more alert  ?AKI: Patient's creatinine was 1.28 on admission, last known creatinine was 2011 and it was 0.87-1.18.  We will continue fluids for now and recheck BMP in the morning. -BMP in a.m.  Substance abuse: Mother reports that patient has had issues with drugs since 9th grade.  He uses cocaine however it is not clear how often or how much he uses. -Psych consult  FEN: Normal saline 125 cc/hr, replete lytes prn, NPO VTE ppx: Lovenox  Code Status: FULL    Dispo: Admit patient to Observation with expected length of stay less than 2 midnights.  Signed: Claudean Severance, MD 03/31/2018, 10:01 AM  Pager: [phone]

## 2018-03-31 NOTE — ED Notes (Addendum)
Patient very diaphoretic. Answers all questions with affirmative-sounding "mmhmm" mumble, including non-yes-no questions. Refused to open eyes when asked, eyes opened spontaneously and turned head to look at TTS machine, continued to mumble at interviewer, then started vomiting. MD at bedside.

## 2018-03-31 NOTE — ED Notes (Signed)
Pt given sandwich and soda. Sitter attempting to rouse. Pt is responsive to verbal stimuli but slow to rouse and sit up.

## 2018-03-31 NOTE — Consult Note (Signed)
Patient seen but he was unable to participate in a psychiatric evaluation due to been sedated. Dr. Gwyneth Revels contacted and she agreed to send another psychiatric consult when patient is alert and oriented.   Thedore Mins, MD  Attending psychiatrist.

## 2018-03-31 NOTE — ED Notes (Signed)
Ordered a diet tray for pt  

## 2018-03-31 NOTE — Progress Notes (Signed)
Unable to assess CIWA patient is asleep.

## 2018-03-31 NOTE — ED Notes (Signed)
Poison control recommends repeat EKG at this time.  

## 2018-04-01 ENCOUNTER — Inpatient Hospital Stay (HOSPITAL_COMMUNITY): Payer: Self-pay

## 2018-04-01 DIAGNOSIS — F1010 Alcohol abuse, uncomplicated: Secondary | ICD-10-CM

## 2018-04-01 DIAGNOSIS — F17210 Nicotine dependence, cigarettes, uncomplicated: Secondary | ICD-10-CM

## 2018-04-01 DIAGNOSIS — F332 Major depressive disorder, recurrent severe without psychotic features: Secondary | ICD-10-CM

## 2018-04-01 DIAGNOSIS — R935 Abnormal findings on diagnostic imaging of other abdominal regions, including retroperitoneum: Secondary | ICD-10-CM

## 2018-04-01 DIAGNOSIS — T1491XA Suicide attempt, initial encounter: Secondary | ICD-10-CM

## 2018-04-01 DIAGNOSIS — G8929 Other chronic pain: Secondary | ICD-10-CM

## 2018-04-01 DIAGNOSIS — T50902A Poisoning by unspecified drugs, medicaments and biological substances, intentional self-harm, initial encounter: Secondary | ICD-10-CM

## 2018-04-01 DIAGNOSIS — R4182 Altered mental status, unspecified: Secondary | ICD-10-CM

## 2018-04-01 DIAGNOSIS — R109 Unspecified abdominal pain: Secondary | ICD-10-CM

## 2018-04-01 LAB — BASIC METABOLIC PANEL
Anion gap: 7 (ref 5–15)
BUN: 8 mg/dL (ref 6–20)
CHLORIDE: 111 mmol/L (ref 98–111)
CO2: 26 mmol/L (ref 22–32)
CREATININE: 1.25 mg/dL — AB (ref 0.61–1.24)
Calcium: 8.7 mg/dL — ABNORMAL LOW (ref 8.9–10.3)
GFR calc Af Amer: >60 mL/min (ref >60)
GFR calc non Af Amer: >60 mL/min (ref >60)
Glucose, Bld: 96 mg/dL (ref 70–99)
Potassium: 3.8 mmol/L (ref 3.5–5.1)
Sodium: 144 mmol/L (ref 135–145)

## 2018-04-01 LAB — RPR: RPR Ser Ql: NONREACTIVE

## 2018-04-01 NOTE — Consult Note (Addendum)
[facility] Consult   Reason for Consult:  Overdose Referring Physician:  EDP Patient Identification: David Mcconnell MRN:  [id number] Principal Diagnosis: Intentional drug overdose [facility]) Diagnosis:   Patient Active Problem List   Diagnosis Date Noted  . Serotonin syndrome [G25.79] 03/31/2018  . Intentional drug overdose ([place]) [T50.902A]     Total Time spent with patient: 45 minutes  Subjective:   David Mcconnell is a 37 y.o. male patient admitted with intentional overdose.  He reports, "I did overdose on purpose to see how much my girlfriend loved me."  Needs admission to psychiatric hospital after medically clear.  Dr Dwyane Dee reviewed this patient and concurs with the plan.  HPI:  Patient reports he intentionally overdosed to see how much his girlfriend loved him, minimizing his actions.  However, he has overdosed at least once in the past--admitted in 2011 to [facility] for similar actions.  Denies anxiety, homicidal ideations, hallucinations, and withdrawal symptoms.  Uses cocaine and alcohol on a regular basis for years, last drink was Thursday on 10/10.  Multiple prison admissions due to breaking and entering, larceny.  No current charges.  Does not want to go to [facility] but explained the seriousness of his overdose.  Inconsistent historian which is also risky as he denied every going to a psychiatric hospital or overdosing in the past.  Caveat:  History of swallowing razor blades and other dangerous items.  Past Psychiatric History: depression, substance abuse  Risk to Self:  yes Risk to Others:  no Prior Inpatient Therapy:  once at [facility] Prior Outpatient Therapy:  yes  Past Medical History:  Past Medical History:  Diagnosis Date  . Cocaine abuse ([place])    History reviewed. No pertinent surgical history. Family History: History reviewed. No pertinent family history. Family Psychiatric  History: none Social History:  Social History   Substance and Sexual Activity   Alcohol Use Not Currently     Social History   Substance and Sexual Activity  Drug Use Yes  . Types: Cocaine    Social History   Socioeconomic History  . Marital status: Single    Spouse name: Not on file  . Number of children: Not on file  . Years of education: Not on file  . Highest education level: Not on file  Occupational History  . Not on file  Social Needs  . Financial resource strain: Not on file  . Food insecurity:    Worry: Not on file    Inability: Not on file  . Transportation needs:    Medical: Not on file    Non-medical: Not on file  Tobacco Use  . Smoking status: Current Some Day Smoker  . Smokeless tobacco: Never Used  Substance and Sexual Activity  . Alcohol use: Not Currently  . Drug use: Yes    Types: Cocaine  . Sexual activity: Not on file  Lifestyle  . Physical activity:    Days per week: Not on file    Minutes per session: Not on file  . Stress: Not on file  Relationships  . Social connections:    Talks on phone: Not on file    Gets together: Not on file    Attends religious service: Not on file    Active member of club or organization: Not on file    Attends meetings of clubs or organizations: Not on file    Relationship status: Not on file  Other Topics Concern  . Not on file  Social History  Narrative  . Not on file   Additional Social History:    Allergies:   Allergies  Allergen Reactions  . Tegretol [Carbamazepine] Rash and Other (See Comments)    Blisters in mouth    Labs:  Results for orders placed or performed during the hospital encounter of 03/30/18 (from the past 48 hour(s))  CBG monitoring, ED     Status: Abnormal   Collection Time: 03/30/18 11:01 PM  Result Value Ref Range   Glucose-Capillary 108 (H) 70 - 99 mg/dL  CBC with Differential     Status: None   Collection Time: 03/30/18 11:02 PM  Result Value Ref Range   WBC 7.2 4.0 - 10.5 K/uL   RBC 5.48 4.22 - 5.81 MIL/uL   Hemoglobin 14.7 13.0 - 17.0 g/dL   HCT  48.4 39.0 - 52.0 %   MCV 88.3 80.0 - 100.0 fL   MCH 26.8 26.0 - 34.0 pg   MCHC 30.4 30.0 - 36.0 g/dL   RDW 13.5 11.5 - 15.5 %   Platelets 305 150 - 400 K/uL   nRBC 0.0 0.0 - 0.2 %   Neutrophils Relative % 75 %   Neutro Abs 5.4 1.7 - 7.7 K/uL   Lymphocytes Relative 15 %   Lymphs Abs 1.1 0.7 - 4.0 K/uL   Monocytes Relative 7 %   Monocytes Absolute 0.5 0.1 - 1.0 K/uL   Eosinophils Relative 2 %   Eosinophils Absolute 0.1 0.0 - 0.5 K/uL   Basophils Relative 1 %   Basophils Absolute 0.0 0.0 - 0.1 K/uL   Immature Granulocytes 0 %   Abs Immature Granulocytes 0.01 0.00 - 0.07 K/uL    Comment: Performed at [facility], [address], [place], [place] [address]  Comprehensive metabolic panel     Status: Abnormal   Collection Time: 03/30/18 11:02 PM  Result Value Ref Range   Sodium 140 135 - 145 mmol/L   Potassium 3.8 3.5 - 5.1 mmol/L   Chloride 104 98 - 111 mmol/L   CO2 25 22 - 32 mmol/L   Glucose, Bld 108 (H) 70 - 99 mg/dL   BUN 6 6 - 20 mg/dL   Creatinine, Ser 1.28 (H) 0.61 - 1.24 mg/dL   Calcium 9.5 8.9 - 10.3 mg/dL   Total Protein 7.3 6.5 - 8.1 g/dL   Albumin 4.3 3.5 - 5.0 g/dL   AST 24 15 - 41 U/L   ALT 20 0 - 44 U/L   Alkaline Phosphatase 67 38 - 126 U/L   Total Bilirubin 0.8 0.3 - 1.2 mg/dL   GFR calc non Af Amer >60 >60 mL/min   GFR calc Af Amer >60 >60 mL/min    Comment: (NOTE) The eGFR has been calculated using the CKD EPI equation. This calculation has not been validated in all clinical situations. eGFR's persistently <60 mL/min signify possible Chronic Kidney Disease.    Anion gap 11 5 - 15    Comment: Performed at [facility] [address] [place], [place] [address]  Acetaminophen level     Status: Abnormal   Collection Time: 03/30/18 11:03 PM  Result Value Ref Range   Acetaminophen (Tylenol), Serum <10 (L) 10 - 30 ug/mL    Comment: (NOTE) Therapeutic concentrations vary significantly. A range of 10-30 ug/mL  may be an effective concentration  for many patients. However, some  are best treated at concentrations outside of this range. Acetaminophen concentrations >150 ug/mL at 4 hours after ingestion  and >  50 ug/mL at 12 hours after ingestion are often associated with  toxic reactions. Performed at [facility], [place] [address], [place], [place] [address]   Salicylate level     Status: None   Collection Time: 03/30/18 11:03 PM  Result Value Ref Range   Salicylate Lvl <5.2 2.8 - 30.0 mg/dL    Comment: Performed at [facility] [address], [place], [place] [address]  Ethanol     Status: None   Collection Time: 03/30/18 11:03 PM  Result Value Ref Range   Alcohol, Ethyl (B) <10 <10 mg/dL    Comment: (NOTE) Lowest detectable limit for serum alcohol is 10 mg/dL. For medical purposes only. Performed at [facility], [place] [address] [place], [place] [address]   I-Stat Chem 8, ED     Status: Abnormal   Collection Time: 03/30/18 11:06 PM  Result Value Ref Range   Sodium 140 135 - 145 mmol/L   Potassium 3.9 3.5 - 5.1 mmol/L   Chloride 103 98 - 111 mmol/L   BUN 6 6 - 20 mg/dL   Creatinine, Ser 1.20 0.61 - 1.24 mg/dL   Glucose, Bld 111 (H) 70 - 99 mg/dL   Calcium, Ion 1.12 (L) 1.15 - 1.40 mmol/L   TCO2 26 22 - 32 mmol/L   Hemoglobin 16.0 13.0 - 17.0 g/dL   HCT 47.0 39.0 - 52.0 %  I-Stat Arterial Blood Gas, ED - (order at [facility] and [facility] only)     Status: Abnormal   Collection Time: 03/30/18 11:19 PM  Result Value Ref Range   pH, Arterial 7.357 7.350 - 7.450   pCO2 arterial 47.3 32.0 - 48.0 mmHg   pO2, Arterial 75.0 (L) 83.0 - 108.0 mmHg   Bicarbonate 26.2 20.0 - 28.0 mmol/L   TCO2 28 22 - 32 mmol/L   O2 Saturation 93.0 %   Acid-Base Excess 1.0 0.0 - 2.0 mmol/L   Patient temperature 101.1 F    Collection site RADIAL, ALLEN'S TEST ACCEPTABLE    Drawn by Operator    Sample type ARTERIAL   Rapid urine drug screen (hospital performed)     Status: Abnormal   Collection Time: 03/31/18  1:40 AM  Result Value  Ref Range   Opiates NONE DETECTED NONE DETECTED   Cocaine POSITIVE (A) NONE DETECTED   Benzodiazepines NONE DETECTED NONE DETECTED   Amphetamines NONE DETECTED NONE DETECTED   Tetrahydrocannabinol NONE DETECTED NONE DETECTED   Barbiturates NONE DETECTED NONE DETECTED    Comment: (NOTE) DRUG SCREEN FOR MEDICAL PURPOSES ONLY.  IF CONFIRMATION IS NEEDED FOR ANY PURPOSE, NOTIFY LAB WITHIN 5 DAYS. LOWEST DETECTABLE LIMITS FOR URINE DRUG SCREEN Drug Class                     Cutoff (ng/mL) Amphetamine and metabolites    1000 Barbiturate and metabolites    200 Benzodiazepine                 272 Tricyclics and metabolites     300 Opiates and metabolites        300 Cocaine and metabolites        300 THC                            50 Performed at [facility], [place] [address] [place], [place] [address]   Ammonia     Status: None   Collection  Time: 03/31/18  8:19 AM  Result Value Ref Range   Ammonia 29 9 - 35 umol/L    Comment: Performed at [facility], [place] [address] [place], [place] [address]  CK     Status: None   Collection Time: 03/31/18  8:19 AM  Result Value Ref Range   Total CK 107 49 - 397 U/L    Comment: Performed at [facility], [place] [address], [place], [place] [address]  Magnesium     Status: None   Collection Time: 03/31/18  8:19 AM  Result Value Ref Range   Magnesium 2.1 1.7 - 2.4 mg/dL    Comment: Performed at [facility] [address] [place], [place] [address]  Phosphorus     Status: None   Collection Time: 03/31/18  8:19 AM  Result Value Ref Range   Phosphorus 3.8 2.5 - 4.6 mg/dL    Comment: Performed at [facility] [address] [place], [place] [address]  TSH     Status: None   Collection Time: 03/31/18  8:19 AM  Result Value Ref Range   TSH 0.351 0.350 - 4.500 uIU/mL    Comment: Performed by a 3rd Generation assay with a functional sensitivity of <=0.01 uIU/mL. Performed at [facility], [place] [address],  [place], [place] [address]   Rapid HIV screen (HIV 1/2 Ab+Ag)     Status: None   Collection Time: 03/31/18  8:19 AM  Result Value Ref Range   HIV-1 P24 Antigen - HIV24 NON REACTIVE NON REACTIVE   HIV 1/2 Antibodies NON REACTIVE NON REACTIVE   Interpretation (HIV Ag Ab)      A non reactive test result means that HIV 1 or HIV 2 antibodies and HIV 1 p24 antigen were not detected in the specimen.    Comment: NON REACTIVE Performed at [facility], [place] [address], [place], [place] [address]   RPR     Status: None   Collection Time: 03/31/18  8:19 AM  Result Value Ref Range   RPR Ser Ql Non Reactive Non Reactive    Comment: (NOTE) Performed At: [facility] [place], [place] [id number] Rush Farmer MD TO:[id number]   I-Stat venous blood gas, ED     Status: Abnormal   Collection Time: 03/31/18  8:30 AM  Result Value Ref Range   pH, Ven 7.414 7.250 - 7.430   pCO2, Ven 46.7 44.0 - 60.0 mmHg   pO2, Ven 55.0 (H) 32.0 - 45.0 mmHg   Bicarbonate 29.9 (H) 20.0 - 28.0 mmol/L   TCO2 31 22 - 32 mmol/L   O2 Saturation 88.0 %   Acid-Base Excess 4.0 (H) 0.0 - 2.0 mmol/L   Patient temperature HIDE    Sample type VENOUS   Basic metabolic panel     Status: Abnormal   Collection Time: 04/01/18  6:02 AM  Result Value Ref Range   Sodium 144 135 - 145 mmol/L   Potassium 3.8 3.5 - 5.1 mmol/L   Chloride 111 98 - 111 mmol/L   CO2 26 22 - 32 mmol/L   Glucose, Bld 96 70 - 99 mg/dL   BUN 8 6 - 20 mg/dL   Creatinine, Ser 1.25 (H) 0.61 - 1.24 mg/dL   Calcium 8.7 (L) 8.9 - 10.3 mg/dL   GFR calc non Af Amer >60 >60 mL/min   GFR calc Af Amer >60 >60 mL/min    Comment: (NOTE) The  eGFR has been calculated using the CKD EPI equation. This calculation has not been validated in all clinical situations. eGFR's persistently <60 mL/min signify possible Chronic Kidney Disease.    Anion gap 7 5 - 15    Comment: Performed at [facility] [address], [place], [place] [address]     Current Facility-Administered Medications  Medication Dose Route Frequency Provider Last Rate Last Dose  . acetaminophen (TYLENOL) tablet 650 mg  650 mg Oral Q6H PRN Ina Homes, MD       Or  . acetaminophen (TYLENOL) suppository 650 mg  650 mg Rectal Q6H PRN Helberg, Justin, MD      . enoxaparin (LOVENOX) injection 40 mg  40 mg Subcutaneous Q24H Helberg, Justin, MD   40 mg at 04/01/18 1229  . ondansetron (ZOFRAN) tablet 4 mg  4 mg Oral Q6H PRN Ina Homes, MD   4 mg at 04/01/18 0024   Or  . ondansetron (ZOFRAN) injection 4 mg  4 mg Intravenous Q6H PRN Ina Homes, MD   4 mg at 03/31/18 1335    Musculoskeletal: Strength & Muscle Tone: within normal limits Gait & Station: normal Patient leans: N/A  Psychiatric Specialty Exam: Physical Exam  Nursing note and vitals reviewed. Constitutional: He appears well-developed and well-nourished.  HENT:  Head: Normocephalic.  Neck: Normal range of motion.  Psychiatric: His speech is normal and behavior is normal. Thought content normal. His affect is blunt. Cognition and memory are normal. He expresses impulsivity. He exhibits a depressed mood.    Review of Systems  Psychiatric/Behavioral: Positive for depression and substance abuse.  All other systems reviewed and are negative.   Blood pressure (!) 143/84, pulse 80, temperature 99 F (37.2 C), temperature source Oral, resp. rate 18, height 6' (1.829 m), weight 99.8 kg, SpO2 96 %.Body mass index is 29.84 kg/m.  General Appearance: Casual  Eye Contact:  Good  Speech:  Normal Rate  Volume:  Normal  Mood:  Depressed  Affect:  Blunt  Thought Process:  Coherent and Descriptions of Associations: Intact  Orientation:  Full (Time, Place, and Person)  Thought Content:  Rumination  Suicidal Thoughts:  Yes.  with intent/plan  Homicidal Thoughts:  No  Memory:  Immediate;   Fair Recent;   Fair Remote;   Fair  Judgement:  Poor  Insight:  Lacking  Psychomotor Activity:   Decreased  Concentration:  Concentration: Fair and Attention Span: Fair  Recall:  [employer] of Knowledge:  Fair  Language:  Good  Akathisia:  No  Handed:  Right  AIMS (if indicated):     Assets:  Leisure Time Physical Health Resilience Social Support  ADL's:  Intact  Cognition:  WNL  Sleep:        Treatment Plan Summary: Major depressive disorder, recurrent, severe without psychosis:  Disposition: Recommend psychiatric Inpatient admission when medically cleared.  Waylan Boga, NP 04/01/2018 2:48 PM

## 2018-04-01 NOTE — Plan of Care (Signed)
  Problem: Nutrition: Goal: Adequate nutrition will be maintained Outcome: Progressing   

## 2018-04-01 NOTE — Clinical Social Work Note (Signed)
Received consult for psych placement for patient. Contacted [facility] (, referral made and patient declined as he is too acute and they do not have an appropriate bed for patient. Contacted [facility] and made referral and awaiting call back from intake staff. CSW also attempted to reach resident (paged resident twice at [phone]) in order to get IV-C paperwork signed, but did not receive a call. Handoff left for covering CSW for 10/15.  Genelle Bal, MSW, LCSW Licensed Clinical Social Worker Clinical Social Work Department [employer] [phone]

## 2018-04-01 NOTE — Discharge Summary (Signed)
Name: David Mcconnell MRN: [id number] DOB: 05-22-1981 37 y.o. PCP: Patient, No Pcp Per  Date of Admission: 03/30/2018 10:50 PM Date of Discharge: 04/12/2018 Attending Physician: Gust Rung, DO  Discharge Diagnosis: 1. Serotonin syndrome secondary to intentional overdose 2. Polubstance abuse 3. Ingested metallic foreign body  Discharge Medications: Allergies as of 04/12/2018      Reactions   Tegretol [carbamazepine] Rash, Other (See Comments)   Blisters in mouth      Medication List    TAKE these medications   acetaminophen 500 MG tablet Commonly known as:  TYLENOL Take 1,000 mg by mouth every 6 (six) hours as needed for mild pain.   buPROPion 150 MG 24 hr tablet Commonly known as:  WELLBUTRIN XL Take 150 mg by mouth daily. Notes to patient:  You did not receive during this hospitalization. Resume your home schedule.   cyanocobalamin 1000 MCG/ML injection Commonly known as:  (VITAMIN B-12) Inject 1,000 mcg into the muscle every 30 (thirty) days.   hydrOXYzine 25 MG tablet Commonly known as:  ATARAX/VISTARIL Take 25 mg by mouth 3 (three) times daily as needed for anxiety.   mirtazapine 15 MG tablet Commonly known as:  REMERON Take 7.5-15 mg by mouth See admin instructions. Taking 1/2 (7.5 mg) to one tablet (15mg ) as needed at bedtime for sleep   pantoprazole 20 MG tablet Commonly known as:  PROTONIX Take 1 tablet (20 mg total) by mouth daily.       Disposition and follow-up:   Mr.David Mcconnell was discharged from [facility] in Good condition.  At the hospital follow up visit please address:  1.  Serotonin syndrome: He was medically managed with supportive care, he had taken a lot of his girlfriends Effexor, please assess for any more behavior like this.   Substance abuse: Please assess for current substance use.   2.  Labs / imaging needed at time of follow-up: None  3.  Pending labs/ test needing follow-up: None  Hospital  Course by problem list: 1. Serotonin syndrome secondary to intentional overdose: This is a 37 year old male with no known past medical history, possible staging psychiatric conditions, who presented after ingestion of about 20 tablets 100 mg Lamictal, 6 tablets of 150 mg Effexor, and cocaine use.  He was found to be unresponsive and some episodes of vomiting.  He was found to be febrile to 101.1, tachypneic up to 32, tachycardic to 109, and hypertensive to 150/100. ABG showed pH 7.35, PCO2 47.3, PO2 75, bicarb 26.2.  Acetaminophen, folate, ethanol, ammonia, CK, TSH, mag and phosphorus were all WNL.  UDS was positive for cocaine.  Chest x-ray showed no acute findings.  Abdominal x-ray did show a small linear radiodensity right upper quadrant possible foreign body.  Poison control was contacted and they initially recommended a 6 hour observation, however patient had no improvement during this time so he was admitted.  He was given 2 L of normal saline and 1.5 mg of Ativan. He was medically managed with supportive care and his symptoms resolved. He denied suicidal and homicidal ideation throughout his admission. He was initially placed under involuntary commitment per psychiatry and spent 12 days in our hospital awaiting placement to a psychiatric hospital. He was re-evaluated by psychiatry who found that he was past the acute crisis period and no longer warranted inpatient psychiatric hospitalization. He was encouraged to follow up with [facility] for medication management and therapy. He was restarted on his home medications: Wellbutrin, Remeron,  and Atarax.    2. Polysubstance abuse: Patient reported frequent cocaine and alcohol use.  He was advised to stop all poly-subtance use.  3. Ingested metallic foreign body: He denied any abnormal ingestions recently, small hyperdense area was seen on abdominal x-ray, repeat x-ray showed movement of the object. He has a history of ingesting foreign objects such as magnets,  razors, and toothbrushes. It seemed relatively benign and he was not having any new symptoms so we recommend a follow up x-ray to ensure it's passage. Follow-up X-ray showed little movement of the foreign object. GI was consulted who performed an enteroscopy which was unsuccessfully in removing the object. He then underwent an abdominal CT which revealed that the foreign object was embedded in the wall of the duodenum. Surgery was consulted and recommended against surgery. Mr. Rathgeber was informed that the metallic object will likely not pass on it's own and that he must inform any provider before undergoing further imaging (especially MRI). He expressed understanding and agreement.   Discharge Vitals:   BP (!) 143/84 (BP Location: Left Arm)   Pulse 80   Temp 99 F (37.2 C) (Oral)   Resp 18   Ht 6' (1.829 m)   Wt 99.8 kg   SpO2 96%   BMI 29.84 kg/m   Pertinent Labs, Studies, and Procedures:   Last Abdominal X-ray on 03/31/18: IMPRESSION: 1. Small cylindrical foreign body just to the right of midline in the central abdomen, probably in small bowel. I would favor this as being the head of a writing pen based on morphology. No dilated bowel. Postoperative findings in the right mid abdomen likely from prior bowel surgery.  04/11/18 CT of Abdomen/Pelvis: IMPRESSION: Radiopaque density seen by plain film is noted adjacent to and possibly imbedded within the anterior wall of the transverse duodenum. There is no surrounding fluid or gas collection.  Postoperative changes in the stomach, transverse colon, and mid abdominal small bowel.  Moderate stool burden throughout the colon.  Signed: Synetta Shadow, MD 04/12/2018 Pager: [phone]

## 2018-04-01 NOTE — Progress Notes (Addendum)
Subjective: Mr. Tinnon was awake and alert this morning, no acute events overnight.  He reports that he has been feeling okay he has been having some abdominal pain which is chronic for him.  He also reports he has been having some anxiety and has a slight tremor however this has improved since being in the hospital.  He denies any nausea right now and reports and reports that he is hungry.  He felt in some of the history, he reports that he had taken the medication because he had with his girlfriend and he took the medications because they were.  He snorts cocaine about 1 g a day, he has never injected any drugs.  He drinks about one 40 every night, he is never had any withdrawal symptoms that he knows of.  In terms of the findings on abdominal CT scan he reports that he has not swallowed anything out of the ordinary recently, he does have a history of swallowing various objects such as magnets or razors however has not done this for a few months.  We discussed that psychiatry will see him today to evaluate him.  He has agreeable to this.  All other questions were answered.  Objective:  Vital signs in last 24 hours: Vitals:   03/31/18 1147 03/31/18 1345 03/31/18 1749 03/31/18 2046  BP: 134/81 135/84 132/81 (!) 145/94  Pulse: 78 74 72 92  Resp: (!) 22 18 20 20   Temp: 98.1 F (36.7 C) 98.1 F (36.7 C) 97.9 F (36.6 C) 98 F (36.7 C)  TempSrc: Axillary Axillary Axillary Axillary  SpO2: 97% 94% 97% 94%  Weight:      Height:        General: Well nourished, well appearing, NAD  Cardiac: RRR, normal S1, S2, no murmurs, rubs or gallops  Pulmonary: Lungs CTA bilaterally, no wheezing, rhonchi or rales  Abdomen: Multiple abdominal scars, abdominal tenderness diffusely, no guarding or rebound tenderness Extremity: No clonus, mild tremor bilateral no lower extremity edema, left ankle plan/embolized Psychiatry: Normal mood and affect     Assessment/Plan: Active Problems:   Serotonin  syndrome   Intentional drug overdose (HCC) This is a 37 year old male with history of cocaine abuse and multiple suicidal attempts presenting after  consuming Lamictal, Effexor, using cocaine.  He is currently hemodynamically stable and 24 hours after the ingestion. He has been more alert today and is hemodynamically stable.  AMS 2/2 Serotonin syndrome: Patient will initially presented altered mental status after ingesting Effexor.  He was found to be hypertensive, hyperthermic, tachycardic, and have pupillary dilation, diaphoresis, and hyperreflexia.  He was treated with supportive care of IV normal saline and Zofran for nausea and placed on a CIWA protocol.  He has symptomatically improved today and is stable.  Given his history of suicidal attempts and the ingestion of Effexor psychiatry has been consulted again.  They were unable to see him yesterday due to his sedation. -Discontinue IV fluids -Psych consult, possible discharge pending their evaluation -Zofran as needed -Continue suicide precautions for now -Discontinue telemetry and CIWA protocol  Possible ingested foreign body: CT scan showed a small linear hypodensity in the RUQ, ingested foreign body could not be ruled out.  Patient reported that he did not ingest any abnormal objects recently.  Unknown what this is. -Repeat abdominal x-ray showed a radiopaque structure overlying the midline of the abdomen potentially within the proximal small bowel  -Since it seems to have progressed throughout the intestines recommend following up outpatient with  a repeat chest x-ray to ensure passage  Creatinine change: Creatinine on admission was 1.28 slightly elevated from last known creatinine in 2011 today his creatinine was 1.25.  This is likely more reflective of his actual creatinine baseline.  Substance abuse: Patient reports that he is cocaine about 1 g a day and only snorts it never injects.  He also drinks about one 40 oz of alcohol per night.   -Recommend cessation -Psych consult  FEN: No fluids, replete lytes prn, regular diet  VTE ppx: Lovenox  Code Status: FULL    Dispo: Anticipated discharge in approximately today. Patient is medically cleared to be transferred to inpatient behavioral health.   Claudean Severance, MD 04/01/2018, 6:08 AM Pager: [phone]

## 2018-04-01 NOTE — Discharge Instructions (Signed)
David Mcconnell,   It has been a pleasure working with you and we are glad you're feeling better. You were hospitalized for medication overdose. We think that the Effexor is what caused your symptoms. You were given supportive care and your symptoms improved. We did not change any medications. Please follow up with your primary care provider in 1-2 weeks, please call the company that you brought up and get in touch with the primary care provider that they suggest.   It also looks like there is a foreign body in your abdomen. It appears to be moving through your intestines without causing any issues. Please follow up with your primary care provider to obtain another x-ray to ensure that it has passed completely.  Follow up with your primary care provider in 1-2 weeks  If your symptoms worsen or you develop new symptoms, please seek medical help whether it is your primary care provider or emergency department.  If you have any questions about this hospitalization please call [phone].    Drug Overdose A drug overdose happens when you take too much of a drug. An overdose can occur with illegal drugs, prescription drugs, or over-the-counter (OTC) drugs. The effects of a drug overdose can be mild, dangerous, or even deadly. What are the causes? This condition may be caused by:  Taking too much of a drug by accident.  Taking too much of a drug on purpose.  An error made by a health care provider who prescribes a drug.  An error made by the pharmacist who fills the prescription order.  Drugs that commonly cause overdose include:  Mental health drugs.  Pain medicines.  Illegal drugs.  OTC cough and cold medicines.  Heart medicines.  Seizure medicines.  What increases the risk? A drug overdose is more likely in:  Children. They may be attracted to colorful pills. Because of a child's small size, even a small amount of a drug can be dangerous.  Elderly people. They may be  taking many different drugs. Elderly people may have difficulty reading labels or remembering when they last took their medicine.  The risk of a drug overdose is also higher for someone who:  Takes illegal drugs.  Takes a drug and drinks alcohol.  Has a mental health condition.  What are the signs or symptoms? Symptoms of a drug overdose depend on the drug and the amount that was taken. Common danger signs include:  Behavior changes.  Sleepiness.  Slowed breathing.  Nausea and vomiting.  Seizures.  Changes in eye pupil size. The pupil may be very large or very small.  If there are signs and symptoms of very low blood pressure (shock) from an overdose, emergency treatment is required. These include:  Cold and clammy skin.  Pale skin.  Blue lips.  Very slow breathing.  Extreme sleepiness.  Loss of consciousness.  How is this diagnosed? This condition may be diagnosed based on your symptoms. It is important to tell your health care provider:  All of the drugs that you took.  When you took the drugs.  Whether you were drinking alcohol.  Your health care provider will do a physical exam. This exam may include:  Checking and monitoring your heart rate and rhythm, your temperature, and your blood pressure (vital signs).  Checking your breathing and oxygen level.  You may also have tests, including:  Urine tests to check for drugs in your system.  Blood tests to check for: ? Drugs in your system. ?  Signs of an imbalance of your blood minerals (electrolytes). ? Liver damage. ? Kidney damage.  How is this treated? Supporting your vital signs and your breathing is the first step in treating a drug overdose. Treatment may also include:  Giving fluids and electrolytes through an IV tube.  Inserting a breathing tube (endotracheal tube) in your airway to help you breathe.  Passing a tube through your nose and into your stomach (NG tube, or nasogastric tube) to  wash out your stomach.  Giving medicines that: ? Make you vomit. ? Absorb any medicine that is left in your digestive system. ? Block or reverse the effect of the drug that caused the overdose.  Filtering your blood through an artificial kidney machine (hemodialysis). You may need this if your overdose is severe or if you have kidney failure.  Ongoing counseling and mental health support if you intentionally overdosed or used an illegal drug.  Follow these instructions at home:  Take medicines only as directed by your health care provider. Always ask your health care provider about possible side effects of any new drug that you start taking.  Keep a list of all of the drugs that you take, including over-the-counter medicines. Bring this list with you to all of your medical visits.  Drink enough fluid to keep your urine clear or pale yellow.  Keep all follow-up visits as directed by your health care provider. This is important. How is this prevented?  Get help if you are struggling with: ? Alcohol or drug use. ? Depression or another mental health problem.  Keep the phone number of your local poison control center near your phone or on your cell phone.  Store all medicines in safety containers that are out of the reach of children.  Read the drug inserts that come with your medicines.  Do not use illegal drugs.  Do not drink alcohol when taking drugs.  Do not take medicines that are not prescribed for you. Contact a health care provider if:  Your symptoms return.  You develop new symptoms or side effects when you take medicines. Get help right away if:  You think that you or someone else may have taken too much of a drug. The hotline of the [facility] is 757-513-3777.  You or someone else is having symptoms of a drug overdose.  You have serious thoughts about hurting yourself or others.  You become confused.  You have: ? Chest  pain. ? Difficulty breathing. ? A loss of consciousness. Drug overdose is an emergency. Do not wait to see if the symptoms will go away. Get medical help right away. Call your local emergency services (911 in the [place] Do not drive yourself to the hospital. This information is not intended to replace advice given to you by your health care provider. Make sure you discuss any questions you have with your health care provider. Document Released: 10/20/2014 Document Revised: 10/15/2015 Document Reviewed: 06/10/2014 Elsevier Interactive Patient Education  [employer].

## 2018-04-02 LAB — LAMOTRIGINE LEVEL: LAMOTRIGINE LVL: 20.4 ug/mL — AB (ref 2.0–20.0)

## 2018-04-02 NOTE — Plan of Care (Signed)
  Problem: Clinical Measurements: Goal: Remain free from any harm during hospitalization Outcome: Progressing   Problem: Nutrition: Goal: Adequate fluids and nutrition will be maintained Outcome: Progressing

## 2018-04-02 NOTE — Plan of Care (Signed)
  Problem: Activity: Goal: Risk for activity intolerance will decrease Outcome: Progressing   Problem: Nutrition: Goal: Adequate nutrition will be maintained Outcome: Progressing   

## 2018-04-02 NOTE — Plan of Care (Signed)
°  Problem: Coping: °Goal: Level of anxiety will decrease °Outcome: Progressing °  °

## 2018-04-02 NOTE — Progress Notes (Signed)
   Subjective: David Mcconnell was doing well today, no acute events overnight.  He reports that he has not had any anxiety or tremors feels a lot better compared to when he came in.  He does report that he feels a little bit off balance when he goes to the bathroom. He has taken a shower and has been having bowel movements and tolerating his diet well.  We discussed the plan for him to go to the psychiatric hospital when a bed is available and he is in agreement with the plan.  Objective:  Vital signs in last 24 hours: Vitals:   04/01/18 1845 04/01/18 1847 04/01/18 2054 04/02/18 0450  BP: (!) 113/59 (!) 113/59 (!) 148/89 131/76  Pulse: 86 84 88 67  Resp:   18 16  Temp:   98.7 F (37.1 C) 98.7 F (37.1 C)  TempSrc:   Oral Oral  SpO2: 97% 96% 98% 97%  Weight:   101 kg   Height:        General: Well nourished, well appearing, NAD  Cardiac: RRR, normal S1, S2, no murmurs, rubs or gallops  Pulmonary: Lungs CTA bilaterally, no wheezing, rhonchi or rales  Abdomen: Multiple well healed abdominal surgical scars,  Extremity: No LE edema, no muscle atrophy, no lesions or wounds noted  Psychiatry: Normal mood and affect     Assessment/Plan:  Principal Problem:   Intentional drug overdose (HCC) Active Problems:   Serotonin syndrome   Major depressive disorder, recurrent severe without psychotic features (HCC)  This is a 37 year old male with history of cocaine abuse and multiple suicidal attempts presenting afterconsuming Lamictal, Effexor, and using cocaine. Presented with findings consistent with serotonin syndrome. He is currently hemodynamically stable and >48 hours after the ingestion. He was seen by psychiatry who stated he needs admission to the psychiatric hospital after being medically cleared.   AMS 2/2 serotonin syndrome: This has seemed to have resolved, he is currently hemodynamically stable and denies any symptoms at the moment. BMP yesterday showed no acute abnormalities.   Vitals have been stable except for some mild hypertension. -Psychiatry reported that he needs psychiatric hospitalization -CSW assisting with this, appreciate their help.  -Continue suicide precautions for now  Possible ingested foreign body: Found on imaging, patient denied any ingestion of abnormal objects, he does have a history of ingested various objects. Repeat abdominal imaging showed likely progression of material. Recommend a follow up x-ray in about 1 week, likely outpatient, to ensure that this has passed.  -Recommend follow-up x-ray on hospital follow-up  Substance abuse:  -Recommend cessation -Will be admitted to psychiatric unit  FEN: No fluids, replete lytes prn, regular diet  VTE ppx: Lovenox Code Status: FULL    Dispo: Anticipated discharge is pending placement in a psychiatric hospital. CSW assisting.  Claudean Severance, MD 04/02/2018, 6:09 AM Pager: [phone]

## 2018-04-03 DIAGNOSIS — X838XXD Intentional self-harm by other specified means, subsequent encounter: Secondary | ICD-10-CM

## 2018-04-03 DIAGNOSIS — T189XXA Foreign body of alimentary tract, part unspecified, initial encounter: Secondary | ICD-10-CM

## 2018-04-03 NOTE — Clinical Social Work Note (Addendum)
Patient IVC'd on 04/02/18 and paperwork in Mr. Liebler chart. CSW initiated psychiatric placement search on 10/14 and will continue to contact psych facilities.  10/14 - [facility] - declined patient 10/14 - [facility] - declined patient  04/03/18 - Calls made to: [facility] [phone], ext. [phone] - At capacity. [facility]: [phone]; Referral sent - fax #[phone] [facility]:  [phone] - No beds  Genelle Bal, MSW, LCSW Licensed Clinical Social Worker Clinical Social Work Department [employer] [phone]

## 2018-04-03 NOTE — Progress Notes (Signed)
   Subjective: David Mcconnell reports no acute complaints.  He states that he had taken his girlfriends medications in attempt to get her attention.  He denies any current suicidal or homicidal ideation.  He denies shortness of breath, chest pain, abdominal pain.  He is aware that he will be placed in a psychiatric unit and is in agreement.  Objective:  Vital signs in last 24 hours: Vitals:   04/02/18 1237 04/02/18 1836 04/02/18 2150 04/03/18 0600  BP: (!) 155/82 (!) 159/85 (!) 146/97 124/89  Pulse: 73 75 68 64  Resp: 19 18 16 18   Temp: 98.3 F (36.8 C) 98.7 F (37.1 C) 98.2 F (36.8 C) 97.9 F (36.6 C)  TempSrc: Oral Oral Oral Oral  SpO2: 99% 100% 100% 100%  Weight:   99 kg   Height:       Physical Exam  Constitutional: He is oriented to person, place, and time. He appears well-developed and well-nourished.  HENT:  Head: Normocephalic and atraumatic.  Eyes: Conjunctivae and EOM are normal.  Neck: Normal range of motion. Neck supple.  Cardiovascular: Normal rate, regular rhythm and normal heart sounds.  Pulmonary/Chest: Effort normal and breath sounds normal.  Musculoskeletal: He exhibits no edema or tenderness.  Neurological: He is alert and oriented to person, place, and time.  Skin: Skin is warm and dry.  Psychiatric: He has a normal mood and affect. His behavior is normal.  Nursing note and vitals reviewed.   Assessment/Plan:  Principal Problem:   Intentional drug overdose (HCC) Active Problems:   Serotonin syndrome   Major depressive disorder, recurrent severe without psychotic features [facility])  David Mcconnell was admitted for serotonin syndrome secondary to intentional overdose with Lamictal, Effexor, and cocaine.  He is currently hemodynamically stable and more than 72 hours after ingestion.  Psychiatry consulted and recommended admission to the psychiatry ward.  He is currently awaiting placement.  Serotonin Syndrome 2/2 intentional overdose: Resolved 1. Continue  suicide precautions 2. Awaiting psych placement  Likely ingested foreign body: Unknown foreign body found on initial imaging.  Patient denies ingestion of abnormal objects but he does have a history of ingesting various items in the past.  Repeat abdominal imaging showed likely progression of material. 1. Will need repeat X-ray in 1 week to ensure that the unknown foreign body has passed.  This can likely be done as an outpatient.  Polysubstance Abuse: 1. Continue to recommend complete cessation  Dispo: Anticipated discharge to psychiatric hospital within 1-2 days.   Synetta Shadow, MD 04/03/2018, 6:52 AM Pager: [phone]

## 2018-04-04 NOTE — Progress Notes (Signed)
   Subjective: Mr. Butt does not report any new acute medical complaints including abdominal pain, chest pain, shortness of breath. He also states that his mental health is doing much better.  He is frustrated about staying in the hospital while he awaits psychiatric placement.  He would like for [place] to have psychiatry reconsulted to see if he can go home since he has been involuntarly committed for more than a couple days.   Objective:  Vital signs in last 24 hours: Vitals:   04/03/18 0827 04/03/18 1710 04/03/18 2220 04/04/18 0623  BP: 116/88 129/84 138/89 104/67  Pulse: (!) 53 (!) 53 75 (!) 59  Resp: 16 16 18 19   Temp: 99 F (37.2 C) 98.4 F (36.9 C) 98.4 F (36.9 C) 98.4 F (36.9 C)  TempSrc: Oral Oral Oral Oral  SpO2: 97% 95% 97% 97%  Weight:      Height:       Physical Exam  Constitutional: He appears well-developed and well-nourished.  HENT:  Head: Normocephalic and atraumatic.  Eyes: EOM are normal.  Neck: Normal range of motion.  Cardiovascular: Normal rate and regular rhythm.  Pulmonary/Chest: Effort normal and breath sounds normal.  Musculoskeletal: Normal range of motion. He exhibits no edema or tenderness.  Neurological: He is alert.  Skin: Skin is warm and dry.  Psychiatric: He has a normal mood and affect. His behavior is normal.  Nursing note and vitals reviewed.   Assessment/Plan:  Principal Problem:   Intentional drug overdose (HCC) Active Problems:   Serotonin syndrome   Major depressive disorder, recurrent severe without psychotic features (HCC)   Foreign body in digestive system   Mr. Edman was admitted for serotonin syndrome secondary to intentional overdose with Lamictal, Effexor, and cocaine.  He remains hemodynamically stable.  He is currently awaiting psychiatry placement.  Social work aware and working on his case.  Serotonin Syndrome 2/2 intentional overdose: Resolved 1. Continue suicide precautions 2. Awaiting psych placement 3.  Consulted psychiatry for re-evaluation of patient and IVC  Likely ingested foreign body: Unknown foreign body found on initial imaging.  Patient denies ingestion of abnormal objects but he does have a history of ingesting various items in the past. Repeat abdominal imaging showed likely progression of material. 1. Will need repeat X-ray in 1 week to ensure that the unknown foreign body has passed.  This can likely be done as an outpatient.  Polysubstance Abuse: 1. Continue to recommend complete cessation  Dispo: Anticipated discharge to psychiatric hospital within 1-2 days.   Synetta Shadow, MD 04/04/2018, 6:45 AM Pager: [phone]

## 2018-04-04 NOTE — Consult Note (Addendum)
Received consult request for reevaluation of patient after intentional overdose of Lamictal and Effexor complicated by serotonin syndrome. Also concern for foreign body ingestion. He was last seen by the psychiatry service on 10/14. Agree with prior provider's recommendation for inpatient psychiatric hospitalization given the extent of patient's overdose and history of impulsivity. Page also sent to the primary team about this recommendation. Please consult psychiatry if there are future psychiatric needs for this patient.   Juanetta Beets, DO 04/04/18 1:53 PM

## 2018-04-05 NOTE — Clinical Social Work Note (Addendum)
CSW continuing search for psych facility for patient.  10/16 [facility] contacted [phone]) and no beds. 10/17 [facility] [phone]) contacted and referral information faxed. 10/18 [facility] contacted [phone]) and referral information faxed. 10/18 - [facility] contacted [phone]) and referral information faxed. 10/18 - [facility] contacted [phone]) and referral information faxed to [phone]. 10/18 - [facility] contacted [phone]) and they are full 10/18 - [facility] contacted [phone]) and referral faxed -                [phone].  CSW will continue to provide SW intervention services as needed and continue search for a psych facility for patient.  Genelle Bal, MSW, LCSW Licensed Clinical Social Worker Clinical Social Work Department [employer] [phone]

## 2018-04-05 NOTE — Progress Notes (Signed)
   Subjective: [name] is very frustrated with having to stay in the hospital while waiting for psychiatry placement.  He states that he is on parole and is worried about having to be placed in a hospital outside of [facility]. I told him that I would have our social worker check on this for him. He states that he is feeling both medically and mentally well with no acute complaints. He denies suicidal and homicidal ideation. He denies chest pain, SOB, abdominal pain.   Objective:  Vital signs in last 24 hours: Vitals:   04/04/18 0839 04/04/18 1754 04/04/18 2128 04/05/18 0642  BP: 137/77 (!) 136/97 140/87 133/84  Pulse: (!) 53 (!) 52 61 (!) 50  Resp: 18 18 17 18   Temp: 98.6 F (37 C) 98.6 F (37 C) 98.8 F (37.1 C) 98.1 F (36.7 C)  TempSrc: Oral Oral Oral Oral  SpO2: 98% 100% 99% 100%  Weight:      Height:       Physical Exam  Constitutional: He is oriented to person, place, and time. He appears well-developed and well-nourished.  HENT:  Head: Normocephalic and atraumatic.  Eyes: EOM are normal.  Neck: Normal range of motion. Neck supple.  Cardiovascular: Normal rate, regular rhythm and normal heart sounds.  Pulmonary/Chest: Effort normal and breath sounds normal.  Abdominal: Soft. Bowel sounds are normal.  Musculoskeletal: He exhibits no edema or tenderness.  Neurological: He is alert and oriented to person, place, and time.  Skin: Skin is warm and dry.  Psychiatric: He has a normal mood and affect. His behavior is normal.  Nursing note and vitals reviewed.   Assessment/Plan:  Principal Problem:   Intentional drug overdose (HCC) Active Problems:   Serotonin syndrome   Major depressive disorder, recurrent severe without psychotic features (HCC)   Foreign body in digestive system  [name] was admitted for serotonin syndrome secondary to intentional overdose with Lamictal, Effexor,andcocaine.He remains hemodynamically stable. He is currently awaiting  psychiatry placement. Social work aware and working on his case.  Serotonin Syndrome 2/2 intentional overdose: Resolved 1. Continue suicide precautions 2. Awaiting psych placement  Likely ingested foreign body: Unknown foreign body found on initial imaging. Patient denies ingestion of abnormal objects but he does have a history of ingesting various items in the past. Repeat abdominal imaging showed likely progression of material. 1.  X-ray ordered for Sunday, October 20  Polysubstance Abuse: 1. Continue to recommend complete cessation  [facility].  Synetta Shadow, MD 04/05/2018, 7:01 AM Pager: [phone]

## 2018-04-06 MED ORDER — PANTOPRAZOLE SODIUM 20 MG PO TBEC
20.0000 mg | DELAYED_RELEASE_TABLET | Freq: Every day | ORAL | Status: DC
  Administered 2018-04-06 – 2018-04-12 (×7): 20 mg via ORAL
  Filled 2018-04-06 (×7): qty 1

## 2018-04-06 NOTE — Progress Notes (Addendum)
   Subjective: Patient reports some heartburn this am, is on protonix at home.  Otherwise keeping his spirits up, he is worried about being out of work for so long.  Discussed that we will get an x-ray tomorrow to evaluate for the foreign body.  Objective:  Vital signs in last 24 hours: Vitals:   04/05/18 1625 04/05/18 2055 04/06/18 0619 04/06/18 0930  BP: (!) 143/77 126/84 107/67 (!) 110/41  Pulse: 61 68 (!) 52 60  Resp: 18 16 16 20   Temp:  98.7 F (37.1 C) 97.6 F (36.4 C) 97.9 F (36.6 C)  TempSrc:  Oral Oral Oral  SpO2: 100% 96% 100% 99%  Weight:      Height:       Cardiac: normal rate and rhythm, clear s1 and s2 Pulmonary: CTAB, not in distress Abdominal: non distended abdomen, soft and nontender Extremities: no LE edema Psych: Alert, conversant, in good spirits  Assessment/Plan: David Mcconnell was admitted for serotonin syndrome secondary to intentional overdose with Lamictal, Effexor,andcocaine.He remains hemodynamically stable. He is currently awaiting psychiatry placement. Social work aware and working on his case.  Serotonin Syndrome 2/2 intentional overdose: Resolved 1. Continue suicide precautions 2. Awaiting psych placement  Likely ingested foreign body: Unknown foreign body found on initial imaging. Patient denies ingestion of abnormal objects but he does have a history of ingesting various items in the past. Repeat abdominal imaging showed likely progression of material. 1.  X-ray ordered for Sunday, October 20  Polysubstance Abuse: 1. Continue to recommend complete cessation  GERD: on protonix at home, was well controlled but has been off for some time  1. Restart Protonix 20mg  daily  [facility].  Angelita Ingles, MD 04/06/2018, 12:55 PM

## 2018-04-07 ENCOUNTER — Inpatient Hospital Stay (HOSPITAL_COMMUNITY): Payer: Self-pay

## 2018-04-07 DIAGNOSIS — Z79899 Other long term (current) drug therapy: Secondary | ICD-10-CM

## 2018-04-07 NOTE — Progress Notes (Signed)
   Subjective: Patient reports some improvement in his heartburn and is otherwise doing well.  Objective:  Vital signs in last 24 hours: Vitals:   04/06/18 0930 04/06/18 1730 04/06/18 2024 04/07/18 0432  BP: (!) 110/41 131/75  102/64  Pulse: 60 69  (!) 52  Resp: 20 20  18   Temp: 97.9 F (36.6 C) 98.2 F (36.8 C)  98.1 F (36.7 C)  TempSrc: Oral Oral  Oral  SpO2: 99% 97%  99%  Weight:   99.9 kg   Height:       Cardiac: normal rate and rhythm, clear s1 and s2 Pulmonary: CTAB, not in distress Abdominal: non distended abdomen, soft and nontender Extremities: no LE edema Psych: Alert, conversant, in good spirits  Assessment/Plan: Mr. Rho was admitted for serotonin syndrome secondary to intentional overdose with Lamictal, Effexor,andcocaine.He remains hemodynamically stable. He is currently awaiting psychiatry placement. Social work aware and working on his case.  Serotonin Syndrome 2/2 intentional overdose: Resolved 1. Continue suicide precautions 2. Awaiting psych placement  Likely ingested foreign body: Unknown foreign body found on initial imaging. Patient denies ingestion of abnormal objects but he does have a history of ingesting various items in the past. Repeat abdominal imaging showed likely progression of material. 1.  X-ray demonstrates little movement of foreign body compared to prior imaging  Polysubstance Abuse: 1. Continue to recommend complete cessation  GERD: on protonix at home, was well controlled but has been off for some time  1. Continue Protonix 20mg  daily  [facility]: [facility] 1 month  Angelita Ingles, MD 04/07/2018, 1:17 PM

## 2018-04-08 MED ORDER — NICOTINE 21 MG/24HR TD PT24
21.0000 mg | MEDICATED_PATCH | Freq: Every day | TRANSDERMAL | Status: DC
  Administered 2018-04-08 – 2018-04-12 (×5): 21 mg via TRANSDERMAL
  Filled 2018-04-08 (×5): qty 1

## 2018-04-08 NOTE — Progress Notes (Addendum)
   Subjective: David Mcconnell reports feeling overall well today.  He states that he has no acute complaints including abdominal pain, chest pain, shortness of breath.  He would like to start a nicotine patch today.  He normally smokes 1 pack/day.  He does believe that his acid reflux flex has been improved on the PPI which was started yesterday.  Objective:  Vital signs in last 24 hours: Vitals:   04/06/18 2024 04/07/18 0432 04/07/18 2210 04/08/18 0500  BP:  102/64 131/67 121/73  Pulse:  (!) 52 75 (!) 58  Resp:  18 18 18   Temp:  98.1 F (36.7 C) 98.1 F (36.7 C) 97.9 F (36.6 C)  TempSrc:  Oral Oral Oral  SpO2:  99% 100% 100%  Weight: 99.9 kg     Height:       Physical Exam  Constitutional: He appears well-developed and well-nourished.  HENT:  Head: Normocephalic and atraumatic.  Eyes: EOM are normal.  Neck: Normal range of motion.  Cardiovascular: Regular rhythm and normal heart sounds.  Pulmonary/Chest: Effort normal and breath sounds normal.  Abdominal: Soft. Bowel sounds are normal.  Musculoskeletal: He exhibits no edema or tenderness.  Neurological: He is alert.  Skin: Skin is warm and dry.  Psychiatric: He has a normal mood and affect. His behavior is normal.  Nursing note and vitals reviewed.   Assessment/Plan:  Principal Problem:   Intentional drug overdose (HCC) Active Problems:   Serotonin syndrome   Major depressive disorder, recurrent severe without psychotic features (HCC)   Foreign body in digestive system  [name] was admitted for serotonin syndrome secondary to intentional overdose with Lamictal, Effexor,andcocaine.Heremainshemodynamically stable. He is currently awaiting psychiatry placement. Social work aware and working on [employer].  Serotonin Syndrome 2/2 intentional overdose: Resolved 1. Continue suicide precautions 2. Awaiting psych placement  Likely ingested foreign body: Unknown 5 mm foreign body found on initial imaging. Patient  denies ingestion of abnormal objects but he does have a history of ingesting various items in the past. Repeat abdominal X-ray demonstrates little movement of foreign body compared to prior imaging. 1. Continue to monitor for passage of object.  2. Will consult GI to determine if he needs an upper endoscopy to retrieve the foreign body.  Tobacco Use: 1. Start nicotine 21 mg patch/day  Polysubstance Abuse: 1. Continue to recommend complete cessation  GERD:  1. Continue Protonix 20mg  daily  [facility]: [facility]  David Shadow, MD 04/08/2018, 6:34 AM Pager: [phone]

## 2018-04-09 ENCOUNTER — Inpatient Hospital Stay (HOSPITAL_COMMUNITY): Payer: Self-pay | Admitting: Certified Registered Nurse Anesthetist

## 2018-04-09 ENCOUNTER — Encounter (HOSPITAL_COMMUNITY)
Admission: EM | Disposition: A | Discharge status: Home / Self Care | Payer: Self-pay | Source: Home / Self Care | Attending: Internal Medicine

## 2018-04-09 ENCOUNTER — Encounter (HOSPITAL_COMMUNITY): Payer: Self-pay | Admitting: Gastroenterology

## 2018-04-09 DIAGNOSIS — K209 Esophagitis, unspecified: Secondary | ICD-10-CM

## 2018-04-09 DIAGNOSIS — T183XXA Foreign body in small intestine, initial encounter: Secondary | ICD-10-CM

## 2018-04-09 HISTORY — PX: ENTEROSCOPY: SHX5533

## 2018-04-09 LAB — BASIC METABOLIC PANEL
Anion gap: 12 (ref 5–15)
BUN: 15 mg/dL (ref 6–20)
CALCIUM: 9.7 mg/dL (ref 8.9–10.3)
CO2: 24 mmol/L (ref 22–32)
CREATININE: 1.02 mg/dL (ref 0.61–1.24)
Chloride: 103 mmol/L (ref 98–111)
GFR calc Af Amer: >60 mL/min (ref >60)
GLUCOSE: 91 mg/dL (ref 70–99)
Potassium: 4.2 mmol/L (ref 3.5–5.1)
SODIUM: 139 mmol/L (ref 135–145)

## 2018-04-09 LAB — CBC
HCT: 51.6 % (ref 39.0–52.0)
Hemoglobin: 16.2 g/dL (ref 13.0–17.0)
MCH: 27.5 pg (ref 26.0–34.0)
MCHC: 31.4 g/dL (ref 30.0–36.0)
MCV: 87.5 fL (ref 80.0–100.0)
PLATELETS: 248 10*3/uL (ref 150–400)
RBC: 5.9 MIL/uL — AB (ref 4.22–5.81)
RDW: 13.1 % (ref 11.5–15.5)
WBC: 9.5 10*3/uL (ref 4.0–10.5)
nRBC: 0 % (ref 0.0–0.2)

## 2018-04-09 LAB — PROTIME-INR
INR: 0.93
PROTHROMBIN TIME: 12.4 s (ref 11.4–15.2)

## 2018-04-09 SURGERY — ENTEROSCOPY
Anesthesia: Monitor Anesthesia Care

## 2018-04-09 MED ORDER — LACTATED RINGERS IV SOLN
INTRAVENOUS | Status: DC | PRN
  Administered 2018-04-09: 11:00:00 via INTRAVENOUS

## 2018-04-09 MED ORDER — PROPOFOL 10 MG/ML IV BOLUS
INTRAVENOUS | Status: DC | PRN
  Administered 2018-04-09 (×2): 50 mg via INTRAVENOUS
  Administered 2018-04-09: 40 mg via INTRAVENOUS

## 2018-04-09 MED ORDER — PEG 3350-KCL-NA BICARB-NACL 420 G PO SOLR
4000.0000 mL | Freq: Once | ORAL | Status: DC
  Filled 2018-04-09: qty 4000

## 2018-04-09 MED ORDER — SPOT INK MARKER SYRINGE KIT
PACK | SUBMUCOSAL | Status: AC
  Filled 2018-04-09: qty 5

## 2018-04-09 MED ORDER — LIDOCAINE 2% (20 MG/ML) 5 ML SYRINGE
INTRAMUSCULAR | Status: DC | PRN
  Administered 2018-04-09: 100 mg via INTRAVENOUS
  Administered 2018-04-09: 40 mg via INTRAVENOUS

## 2018-04-09 MED ORDER — PROPOFOL 500 MG/50ML IV EMUL
INTRAVENOUS | Status: DC | PRN
  Administered 2018-04-09: 150 ug/kg/min via INTRAVENOUS

## 2018-04-09 MED ORDER — MENTHOL 3 MG MT LOZG: 1.0000 | LOZENGE | OROMUCOSAL | Status: DC | PRN

## 2018-04-09 NOTE — Clinical Social Work Note (Signed)
Patient IVC'd on 04/02/18 and IVC paperwork updated today, faxed to Magistrate and CSW received the Findings and Custody Order Involuntary Commitment paperwork by fax from the [place] office. All IVC paperwork placed in patient's chart.   [facility] contacted - [phone] and CSW advised that no beds today, but clinicals requested and CSW advised to call back tomorrow. Clinicals faxed - [phone].  CSW will continue search for a psych facility for patient.  Genelle Bal, MSW, LCSW Licensed Clinical Social Worker Clinical Social Work Department [employer] [phone]

## 2018-04-09 NOTE — Consult Note (Signed)
Gastroenterology Inpatient Consultation   Attending Requesting Consult [facility] Day: 11  Reason for Consult Retained foreign body   History of Present Illness  David Mcconnell is a 37 y.o. male with a pmh significant for MDD/Anxiety, Recurrent Foreign body ingestion, who presented with recent intentional drug overdose and found to have a retained metal object in his GI tract.  The GI service is consulted for evaluation and management of this foreign body.  This is a patient has had multiple foreign body ingestions over the course the last few years.  This is a result of his underlying psychosocial and mental health disorders.  Patient recounts swallowing many different types of things including pens, toothpaste caps, coins, razor blades, as well as other things including IV pieces.  He has been seen at multiple other medical facilities around the region including [place], [facility], [place], [place], [place], and [place] in the past.  He had multiple interventions/laparotomies per his report including at least 5 or 6 evaluations of a small bowel requiring excisions and removals.  None of those records are here except for one prior laparotomy 2011.  The patient does not recall ingesting anything over the course the last week while he is been in the hospital.  There is a 5 mm metallic object that is noted in the midepigastrium right upper quadrant region that has not moved over the course of serial KUBs.  He has no abdominal pain or nausea or vomiting.  He has not had any melena hematochezia.  He is having normal bowel movements while here.  He had multiple endoscopies in the past.  GI Review of Systems Positive as above Negative for dysphasia, odynophagia, melena, hematochezia   Problem List   Patient Active Problem List   Diagnosis Date Noted  . Foreign body in digestive system   . Major depressive disorder, recurrent severe without psychotic features (HCC) 04/01/2018    . Serotonin syndrome 03/31/2018  . Intentional drug overdose [facility])      Histories  Past Medical History Past Medical History:  Diagnosis Date  . Cocaine abuse (HCC)    History reviewed. No pertinent surgical history. This is incorrect but we do not have the documentation he has had multiple laparotomies.  Allergies Allergies  Allergen Reactions  . Tegretol [Carbamazepine] Rash and Other (See Comments)    Blisters in mouth    Family History History reviewed. No pertinent family history. The patient's FH is negative for IBD/Liver Disease/GI Malignancies.  Social History Social History   Socioeconomic History  . Marital status: Single    Spouse name: Not on file  . Number of children: Not on file  . Years of education: Not on file  . Highest education level: Not on file  Occupational History  . Not on file  Social Needs  . Financial resource strain: Not on file  . Food insecurity:    Worry: Not on file    Inability: Not on file  . Transportation needs:    Medical: Not on file    Non-medical: Not on file  Tobacco Use  . Smoking status: Current Some Day Smoker  . Smokeless tobacco: Never Used  Substance and Sexual Activity  . Alcohol use: Not Currently  . Drug use: Yes    Types: Cocaine  . Sexual activity: Not on file  Lifestyle  . Physical activity:    Days per week: Not on file    Minutes per session: Not on  file  . Stress: Not on file  Relationships  . Social connections:    Talks on phone: Not on file    Gets together: Not on file    Attends religious service: Not on file    Active member of club or organization: Not on file    Attends meetings of clubs or organizations: Not on file    Relationship status: Not on file  . Intimate partner violence:    Fear of current or ex partner: Not on file    Emotionally abused: Not on file    Physically abused: Not on file    Forced sexual activity: Not on file  Other Topics Concern  . Not on file  Social  History Narrative  . Not on file    Medications  Home Medications No current facility-administered medications on file prior to encounter.    Current Outpatient Medications on File Prior to Encounter  Medication Sig Dispense Refill  . acetaminophen (TYLENOL) 500 MG tablet Take 1,000 mg by mouth every 6 (six) hours as needed for mild pain.    Marland Kitchen buPROPion (WELLBUTRIN XL) 150 MG 24 hr tablet Take 150 mg by mouth daily.    . cyanocobalamin (,VITAMIN B-12,) 1000 MCG/ML injection Inject 1,000 mcg into the muscle every 30 (thirty) days.    . hydrOXYzine (ATARAX/VISTARIL) 25 MG tablet Take 25 mg by mouth 3 (three) times daily as needed for anxiety.    . mirtazapine (REMERON) 15 MG tablet Take 7.5-15 mg by mouth See admin instructions. Taking 1/2 (7.5 mg) to one tablet (15mg ) as needed at bedtime for sleep     Scheduled Inpatient Medications . enoxaparin (LOVENOX) injection  40 mg Subcutaneous Q24H  . nicotine  21 mg Transdermal Daily  . pantoprazole  20 mg Oral Daily   Continuous Inpatient Infusions  PRN Inpatient Medications acetaminophen **OR** acetaminophen, ondansetron **OR** ondansetron (ZOFRAN) IV   Review of Systems  General: Denies fevers/chills HEENT: Denies oral lesions Cardiovascular: Denies chest pain Pulmonary: Denies shortness of breath Gastroenterological: See HPI Genitourinary: Denies darkened urine Hematological: Denies easy bruising Psychological: Mood is stable   Physical Examination  BP 104/69 (BP Location: Right Arm)   Pulse 64   Temp 97.8 F (36.6 C) (Oral)   Resp 16   Ht 6' (1.829 m)   Wt 98 kg   SpO2 97%   BMI 29.29 kg/m  GEN: NAD, appears stated age, doesn't appear chronically ill PSYCH: Cooperative, without pressured speech EYE: Conjunctivae pink, sclerae anicteric ENT: MMM, without oral ulcers, no erythema or exudates noted NECK: Supple CV: RR without R/Gs  RESP: CTAB posteriorly, without wheezing GI: NABS, soft, multiple surgical laparotomy  scars are noted in the midabdomen, NT/ND, without rebound or guarding, no HSM appreciated MSK/EXT: No LE edema SKIN: No jaundice NEURO:  Alert & Oriented x 3, no focal deficits   Review of Data  I reviewed the following data at the time of this encounter:  Laboratory Studies  No results for input(s): NA, K, CL, CO2, BUN, CREATININE, LABGLOM, GLUCOSE, CALCIUM, MG, PHOS in the last 168 hours. No results for input(s): AST, ALT, GGT, ALKPHOS in the last 168 hours.  Invalid input(s): TBILI, CONJBILI, ALB  No results for input(s): WBC, HGB, HCT, PLT in the last 168 hours. No results for input(s): APTT, INR in the last 168 hours. Computed MELD-Na score unavailable. Necessary lab results were not found in the last year. Computed MELD score unavailable. Necessary lab results were not found in the  last year.  Imaging Studies  Reviewed in epic  GI Procedures and Studies  None in our system to review   Assessment  David Mcconnell is a 37 y.o. male with a pmh significant for MDD/Anxiety, Recurrent Foreign body ingestion, who presented with recent intentional drug overdose and found to have a retained metal object in his GI tract.  The GI service is consulted for evaluation and management of a retained foreign body.  The patient is in no acute distress.  He has evidence of a metallic object that has not moved significantly over the course of his serial KUBs.  Does not look to be a battery.  This does indicate need for further evaluation.  I suspect that will be beyond the area of my ability to reach with an enteroscope but we will plan a small bowel enteroscopy.  If were not able to reach that lesion then I would plan for the patient to have a GoLYTELY preparation to try and washout the intestinal tract and see if that pushes things along if this is retained then this will no longer be a GI issue but rather a surgical issue may require a surgical laparotomy.  The risks and benefits of endoscopic  evaluation were discussed with the patient; these include but are not limited to the risk of perforation, infection, bleeding, missed lesions, lack of diagnosis, severe illness requiring hospitalization, as well as anesthesia and sedation related illnesses.  The patient is agreeable to proceed.    Plan/Recommendations  Please maintain NPO Status @0000  Plan for tentative SBE on 10/22 Please obtain early AM CBC, BMP, INR Hold Heparin/VTE PPx INR Goal <1.8 Please obtain OSH EGD reports and Surgical history for our surgical colleagues in case this is required  Discharge Planning Diet:NPO@0000  Anticoagulation and antiplatelets: N/A Discharge Medications: N/A Follow up: N/A  Procedure Procedures planned and timing of procedure: SBE on 10/22 Lab work ordered: Yes Hold the following anticoagulation and/or antiplatelets for procedure? Lovenox discontinued for 10/22 and will need to be reordered by primary team after SBE is complete   Thank you for this consult.  We will continue to follow.  Please page/call with questions or concerns.   Corliss Parish, MD [facility] Office # [phone]

## 2018-04-09 NOTE — Progress Notes (Signed)
   Subjective: David Mcconnell denies any new acute complaints including shortness of breath, abdominal pain, chest pain.  He has had multiple regular bowel movements throughout the day yesterday.  He does feel that he continues to be hungry after a meal and is requesting a double meals.   Objective:  Vital signs in last 24 hours: Vitals:   04/08/18 1054 04/08/18 1703 04/08/18 2149 04/09/18 0430  BP: 117/62 128/82 (!) 149/85 104/69  Pulse: 78 81 100 64  Resp: 18 18 16 16   Temp: 98.2 F (36.8 C) 98.6 F (37 C) 98.3 F (36.8 C) 97.8 F (36.6 C)  TempSrc: Oral Oral Oral Oral  SpO2: 99% 99% 92% 97%  Weight:   98 kg   Height:       Physical Exam  Constitutional: He is oriented to person, place, and time. He appears well-developed and well-nourished.  HENT:  Head: Normocephalic and atraumatic.  Eyes: Conjunctivae and EOM are normal.  Neck: Normal range of motion. Neck supple.  Cardiovascular: Normal rate, regular rhythm and normal heart sounds.  Pulmonary/Chest: Effort normal and breath sounds normal.  Abdominal: Soft. Bowel sounds are normal. He exhibits no distension. There is no tenderness.  Musculoskeletal: He exhibits no edema or tenderness.  Neurological: He is alert and oriented to person, place, and time.  Skin: Skin is warm and dry.  Psychiatric: He has a normal mood and affect. His behavior is normal.  Nursing note and vitals reviewed.   Assessment/Plan:  Principal Problem:   Intentional drug overdose (HCC) Active Problems:   Serotonin syndrome   Major depressive disorder, recurrent severe without psychotic features (HCC)   Foreign body in digestive system  David Mcconnell was admitted for serotonin syndrome secondary to intentional overdose with Lamictal, Effexor,andcocaine.Heremainshemodynamically stable.  He does have a retained foreign body which is demonstrated little movement compared to prior imaging.  He will undergo a small bowel enteroscopy today to attempt  to remove the metallic object.  Serotonin Syndrome 2/2 intentional overdose: Resolved 1. Continue suicide precautions 2. Awaiting psych placement  Likely ingested foreign body: Unknown 5 mm foreign body found Mcconnell initial imaging. Patient denies ingestion of abnormal objects but he does have a history of ingesting various items in the past. Repeat abdominal X-ray demonstrates little movement of foreign body compared to prior imaging. 1. Continue to monitor for passage of object.  2. Follow-up enteroscopy results.  Tobacco Use: 1.  Continue nicotine 21 mg patch/day  Polysubstance Abuse: 1. Continue to recommend complete cessation  GERD:  1.ContinueProtonix 20mg  daily  [facility] KitcheUpper MUJuaECrescent City Surgery CeThomaseneMarland KitcheWinUJuaEVa Illiana Healthcare System - David KitcheSaUJuaEEncompass Health New [facility] Rehabiliation AtDavid KitcheCharlotUJuaECollege David KitcUJuaEPalos Hills SurgerThomaseneMarland KitcheUJuaEAcuity Specialty Hospital Of NeThomaseneMarland KitcUJuaENew [facility] Mcconnell(David KitchUJuaEPresbyterian HospThomaseneMarland KitchUJuaECommunity Hospital Mcconnell(9ThomaseneMarland KitchUJuaENorth Point Surgery David KitcheCUJuaEMidlands Orthopaedics Mcconnell(David KitcheUJuaEAlliance CommunityThomaseneMarland KitcheUJuaEV[facility] Mcconnell(David KitcheGrUJuaEMasonicare Mcconnell(David KitcheTobacUJuaEGastroenterology Diagnostic Center MedicThomaseneMarland KitcheWillJuaEEndoscopy Surgery Center [facility] David KitcheWest UJuaEMeadows Psychiatric Centerpidio David Hotels2/2019, 8:32 AM Pager: [phone]

## 2018-04-09 NOTE — Op Note (Signed)
[facility] Patient Name: David Mcconnell Procedure Date : 04/09/2018 MRN: [id number] Attending MD: Justice Britain , MD Date of Birth: Apr 13, 1981 CSN: [id number] Age: 37 Admit Type: Inpatient Procedure:                Small bowel enteroscopy Indications:              Abnormal abdominal x-ray, Foreign body in the small                            bowel Providers:                Justice Britain, MD, Baird Cancer, RN, Elspeth Cho Tech., Technician Referring MD:              Medicines:                Monitored Anesthesia Care Complications:            No immediate complications. Estimated Blood Loss:     Estimated blood loss: none. Procedure:                Pre-Anesthesia Assessment:                           - Prior to the procedure, a History and Physical                            was performed, and patient medications and                            allergies were reviewed. The patient's tolerance of                            previous anesthesia was also reviewed. The risks                            and benefits of the procedure and the sedation                            options and risks were discussed with the patient.                            All questions were answered, and informed consent                            was obtained. Prior Anticoagulants: The patient has                            taken Lovenox (enoxaparin), last dose was 1 day                            prior to procedure. ASA Grade Assessment: II - A  patient with mild systemic disease. After reviewing                            the risks and benefits, the patient was deemed in                            satisfactory condition to undergo the procedure.                           After obtaining informed consent, the endoscope was                            passed under direct vision. Throughout the                            procedure,  the patient's blood pressure, pulse, and                            oxygen saturations were monitored continuously. The                            Colonoscope was introduced through the mouth and                            advanced to the proximal jejunum. The small bowel                            enteroscopy was accomplished without difficulty.                            The patient tolerated the procedure. Scope In: Scope Out: Findings:      No gross lesions were noted in the proximal esophagus and in the mid       esophagus.      LA Grade A (one or more mucosal breaks less than 5 mm, not extending       between tops of 2 mucosal folds) esophagitis with no bleeding was found       in the distal esophagus.      The Z-line was regular and was found 40 cm from the incisors.      No gross lesions were noted in the entire examined stomach.      There was no evidence of significant pathology in the duodenal bulb, in       the first portion of the duodenum, in the second portion of the       duodenum, in the third portion of the duodenum and in the fourth portion       of the duodenum.      There was no evidence of significant pathology in the proximal jejunum.       Could not locate metallic object. Impression:               - No gross lesions in esophagus.                           - LA Grade A esophagitis.                           -  Z-line regular, 40 cm from the incisors.                           - No gross lesions in the stomach.                           - Normal duodenal bulb, first portion of the                            duodenum, second portion of the duodenum, third                            portion of the duodenum and fourth portion of the                            duodenum.                           - The examined portion of the jejunum was normal.                            Unable to locate metallic object with push                             enteroscopy. Recommendation:           - The patient will be observed post-procedure,                            until all discharge criteria are met.                           - Return patient to hospital ward for ongoing care.                           - Would start patient on Laxative in attempt to try                            and flush the metallic object further down the                            bowel. Miralax +/- Bisacodyl today and could                            consider Bowel preparation.                           - Would recheck a KUB (two-view tomorrow)                           - Resume previous diet.                           - Start Omeprazole or Pantoprazole 40 mg once daily.                           -  The findings and recommendations were discussed                            with the patient.                           - The findings and recommendations were discussed                            with the referring physician. Procedure Code(s):        --- Professional ---                           [phone], Small intestinal endoscopy, enteroscopy                            beyond second portion of duodenum, not including                            ileum; diagnostic, including collection of                            specimen(s) by brushing or washing, when performed                            (separate procedure) Diagnosis Code(s):        --- Professional ---                           K20.9, Esophagitis, unspecified                           T18.3XXA, Foreign body in small intestine, initial                            encounter                           R93.5, Abnormal findings on diagnostic imaging of                            other abdominal regions, including retroperitoneum CPT copyright 2018 American Medical Association. All rights reserved. The codes documented in this report are preliminary and upon coder review may  be revised to meet current compliance  requirements. Justice Britain, MD 04/09/2018 12:08:10 PM Number of Addenda: 0

## 2018-04-09 NOTE — Anesthesia Preprocedure Evaluation (Signed)
Anesthesia Evaluation  Patient identified by MRN, date of birth, ID band Patient awake    Reviewed: Allergy & Precautions, NPO status , Patient's Chart, lab work & pertinent test results  Airway Mallampati: II  TM Distance: >3 FB Neck ROM: Full    Dental  (+) Chipped,    Pulmonary Current Smoker,    Pulmonary exam normal breath sounds clear to auscultation       Cardiovascular negative cardio ROS Normal cardiovascular exam Rhythm:Regular Rate:Normal  ECG: NSR, rate 82   Neuro/Psych PSYCHIATRIC DISORDERS Depression negative neurological ROS     GI/Hepatic negative GI ROS, (+)     substance abuse  ,   Endo/Other  negative endocrine ROS  Renal/GU negative Renal ROS     Musculoskeletal negative musculoskeletal ROS (+)   Abdominal   Peds  Hematology negative hematology ROS (+)   Anesthesia Other Findings evaluation of foreign body on xrays  Reproductive/Obstetrics                             Anesthesia Physical Anesthesia Plan  ASA: II  Anesthesia Plan: MAC   Post-op Pain Management:    Induction: Intravenous  PONV Risk Score and Plan: 0 and Propofol infusion and Treatment may vary due to age or medical condition  Airway Management Planned: Nasal Cannula  Additional Equipment:   Intra-op Plan:   Post-operative Plan:   Informed Consent: I have reviewed the patients History and Physical, chart, labs and discussed the procedure including the risks, benefits and alternatives for the proposed anesthesia with the patient or authorized representative who has indicated his/her understanding and acceptance.   Dental advisory given  Plan Discussed with: CRNA  Anesthesia Plan Comments:         Anesthesia Quick Evaluation

## 2018-04-09 NOTE — Progress Notes (Signed)
Nurse called to pt room, pt frustrated with sitter. Pt wants more privacy and for the sitter not to look at him while he is in the restroom. Suicide precaution explained to pt and visitor. Pt wanted to speak with MD, on call MD made aware.

## 2018-04-09 NOTE — Plan of Care (Signed)
  Problem: Elimination: Goal: Will not experience complications related to bowel motility Outcome: Progressing   

## 2018-04-09 NOTE — Interval H&P Note (Signed)
History and Physical Interval Note:  04/09/2018 11:24 AM  David Mcconnell  has presented today for surgery, with the diagnosis of evaluation of foreign body on xrays  The various methods of treatment have been discussed with the patient and family. After consideration of risks, benefits and other options for treatment, the patient has consented to  Procedure(s): ENTEROSCOPY (N/A) as a surgical intervention .  The patient's history has been reviewed, patient examined, no change in status, stable for surgery.  I have reviewed the patient's chart and labs.  Questions were answered to the patient's satisfaction.     The risks and benefits of endoscopic evaluation were discussed with the patient; these include but are not limited to the risk of perforation, infection, bleeding, missed lesions, lack of diagnosis, severe illness requiring hospitalization, as well as anesthesia and sedation related illnesses.  The patient is agreeable to proceed.      [employer]

## 2018-04-09 NOTE — H&P (View-Only) (Signed)
Gastroenterology Inpatient Consultation   Attending Requesting Consult [facility] Day: 11  Reason for Consult Retained foreign body   History of Present Illness  David Mcconnell is a 37 y.o. male with a pmh significant for MDD/Anxiety, Recurrent Foreign body ingestion, who presented with recent intentional drug overdose and found to have a retained metal object in his GI tract.  The GI service is consulted for evaluation and management of this foreign body.  This is a patient has had multiple foreign body ingestions over the course the last few years.  This is a result of his underlying psychosocial and mental health disorders.  Patient recounts swallowing many different types of things including pens, toothpaste caps, coins, razor blades, as well as other things including IV pieces.  He has been seen at multiple other medical facilities around the region including [place], [facility], [place], [place], [facility], and [facility] in the past.  He had multiple interventions/laparotomies per his report including at least 5 or 6 evaluations of a small bowel requiring excisions and removals.  None of those records are here except for one prior laparotomy 2011.  The patient does not recall ingesting anything over the course the last week while he is been in the hospital.  There is a 5 mm metallic object that is noted in the midepigastrium right upper quadrant region that has not moved over the course of serial KUBs.  He has no abdominal pain or nausea or vomiting.  He has not had any melena hematochezia.  He is having normal bowel movements while here.  He had multiple endoscopies in the past.  GI Review of Systems Positive as above Negative for dysphasia, odynophagia, melena, hematochezia   Problem List   Patient Active Problem List   Diagnosis Date Noted  . Foreign body in digestive system   . Major depressive disorder, recurrent severe without psychotic features (HCC) 04/01/2018    . Serotonin syndrome 03/31/2018  . Intentional drug overdose [facility])      Histories  Past Medical History Past Medical History:  Diagnosis Date  . Cocaine abuse (HCC)    History reviewed. No pertinent surgical history. This is incorrect but we do not have the documentation he has had multiple laparotomies.  Allergies Allergies  Allergen Reactions  . Tegretol [Carbamazepine] Rash and Other (See Comments)    Blisters in mouth    Family History History reviewed. No pertinent family history. The patient's FH is negative for IBD/Liver Disease/GI Malignancies.  Social History Social History   Socioeconomic History  . Marital status: Single    Spouse name: Not on file  . Number of children: Not on file  . Years of education: Not on file  . Highest education level: Not on file  Occupational History  . Not on file  Social Needs  . Financial resource strain: Not on file  . Food insecurity:    Worry: Not on file    Inability: Not on file  . Transportation needs:    Medical: Not on file    Non-medical: Not on file  Tobacco Use  . Smoking status: Current Some Day Smoker  . Smokeless tobacco: Never Used  Substance and Sexual Activity  . Alcohol use: Not Currently  . Drug use: Yes    Types: Cocaine  . Sexual activity: Not on file  Lifestyle  . Physical activity:    Days per week: Not on file    Minutes per session: Not on  file  . Stress: Not on file  Relationships  . Social connections:    Talks on phone: Not on file    Gets together: Not on file    Attends religious service: Not on file    Active member of club or organization: Not on file    Attends meetings of clubs or organizations: Not on file    Relationship status: Not on file  . Intimate partner violence:    Fear of current or ex partner: Not on file    Emotionally abused: Not on file    Physically abused: Not on file    Forced sexual activity: Not on file  Other Topics Concern  . Not on file  Social  History Narrative  . Not on file    Medications  Home Medications No current facility-administered medications on file prior to encounter.    Current Outpatient Medications on File Prior to Encounter  Medication Sig Dispense Refill  . acetaminophen (TYLENOL) 500 MG tablet Take 1,000 mg by mouth every 6 (six) hours as needed for mild pain.    Marland Kitchen buPROPion (WELLBUTRIN XL) 150 MG 24 hr tablet Take 150 mg by mouth daily.    . cyanocobalamin (,VITAMIN B-12,) 1000 MCG/ML injection Inject 1,000 mcg into the muscle every 30 (thirty) days.    . hydrOXYzine (ATARAX/VISTARIL) 25 MG tablet Take 25 mg by mouth 3 (three) times daily as needed for anxiety.    . mirtazapine (REMERON) 15 MG tablet Take 7.5-15 mg by mouth See admin instructions. Taking 1/2 (7.5 mg) to one tablet (15mg ) as needed at bedtime for sleep     Scheduled Inpatient Medications . enoxaparin (LOVENOX) injection  40 mg Subcutaneous Q24H  . nicotine  21 mg Transdermal Daily  . pantoprazole  20 mg Oral Daily   Continuous Inpatient Infusions  PRN Inpatient Medications acetaminophen **OR** acetaminophen, ondansetron **OR** ondansetron (ZOFRAN) IV   Review of Systems  General: Denies fevers/chills HEENT: Denies oral lesions Cardiovascular: Denies chest pain Pulmonary: Denies shortness of breath Gastroenterological: See HPI Genitourinary: Denies darkened urine Hematological: Denies easy bruising Psychological: Mood is stable   Physical Examination  BP 104/69 (BP Location: Right Arm)   Pulse 64   Temp 97.8 F (36.6 C) (Oral)   Resp 16   Ht 6' (1.829 m)   Wt 98 kg   SpO2 97%   BMI 29.29 kg/m  GEN: NAD, appears stated age, doesn't appear chronically ill PSYCH: Cooperative, without pressured speech EYE: Conjunctivae pink, sclerae anicteric ENT: MMM, without oral ulcers, no erythema or exudates noted NECK: Supple CV: RR without R/Gs  RESP: CTAB posteriorly, without wheezing GI: NABS, soft, multiple surgical laparotomy  scars are noted in the midabdomen, NT/ND, without rebound or guarding, no HSM appreciated MSK/EXT: No LE edema SKIN: No jaundice NEURO:  Alert & Oriented x 3, no focal deficits   Review of Data  I reviewed the following data at the time of this encounter:  Laboratory Studies  No results for input(s): NA, K, CL, CO2, BUN, CREATININE, LABGLOM, GLUCOSE, CALCIUM, MG, PHOS in the last 168 hours. No results for input(s): AST, ALT, GGT, ALKPHOS in the last 168 hours.  Invalid input(s): TBILI, CONJBILI, ALB  No results for input(s): WBC, HGB, HCT, PLT in the last 168 hours. No results for input(s): APTT, INR in the last 168 hours. Computed MELD-Na score unavailable. Necessary lab results were not found in the last year. Computed MELD score unavailable. Necessary lab results were not found in the  last year.  Imaging Studies  Reviewed in epic  GI Procedures and Studies  None in our system to review   Assessment  Mr. Thornsberry is a 37 y.o. male with a pmh significant for MDD/Anxiety, Recurrent Foreign body ingestion, who presented with recent intentional drug overdose and found to have a retained metal object in his GI tract.  The GI service is consulted for evaluation and management of a retained foreign body.  The patient is in no acute distress.  He has evidence of a metallic object that has not moved significantly over the course of his serial KUBs.  Does not look to be a battery.  This does indicate need for further evaluation.  I suspect that will be beyond the area of my ability to reach with an enteroscope but we will plan a small bowel enteroscopy.  If were not able to reach that lesion then I would plan for the patient to have a GoLYTELY preparation to try and washout the intestinal tract and see if that pushes things along if this is retained then this will no longer be a GI issue but rather a surgical issue may require a surgical laparotomy.  The risks and benefits of endoscopic  evaluation were discussed with the patient; these include but are not limited to the risk of perforation, infection, bleeding, missed lesions, lack of diagnosis, severe illness requiring hospitalization, as well as anesthesia and sedation related illnesses.  The patient is agreeable to proceed.    Plan/Recommendations  Please maintain NPO Status @0000  Plan for tentative SBE on 10/22 Please obtain early AM CBC, BMP, INR Hold Heparin/VTE PPx INR Goal <1.8 Please obtain OSH EGD reports and Surgical history for our surgical colleagues in case this is required  Discharge Planning Diet:NPO@0000  Anticoagulation and antiplatelets: N/A Discharge Medications: N/A Follow up: N/A  Procedure Procedures planned and timing of procedure: SBE on 10/22 Lab work ordered: Yes Hold the following anticoagulation and/or antiplatelets for procedure? Lovenox discontinued for 10/22 and will need to be reordered by primary team after SBE is complete   Thank you for this consult.  We will continue to follow.  Please page/call with questions or concerns.   Corliss Parish, MD [facility] Office # [phone]

## 2018-04-09 NOTE — Transfer of Care (Signed)
Immediate Anesthesia Transfer of Care Note  Patient: David Mcconnell  Procedure(s) Performed: ENTEROSCOPY (N/A )  Patient Location: Endoscopy Unit  Anesthesia Type:MAC  Level of Consciousness: drowsy and patient cooperative  Airway & Oxygen Therapy: Patient Spontanous Breathing and Patient connected to nasal cannula oxygen  Post-op Assessment: Report given to RN and Post -op Vital signs reviewed and stable  Post vital signs: Reviewed and stable  Last Vitals:  Vitals Value Taken Time  BP 104/33 04/09/2018 12:04 PM  Temp 37 C 04/09/2018 12:04 PM  Pulse 77 04/09/2018 12:07 PM  Resp 19 04/09/2018 12:07 PM  SpO2 100 % 04/09/2018 12:07 PM  Vitals shown include unvalidated device data.  Last Pain:  Vitals:   04/09/18 1204  TempSrc: Oral  PainSc: 0-No pain      Patients Stated Pain Goal: 1 (16/10/96 0454)  Complications: No apparent anesthesia complications

## 2018-04-10 ENCOUNTER — Inpatient Hospital Stay (HOSPITAL_COMMUNITY): Payer: Self-pay

## 2018-04-10 DIAGNOSIS — F1910 Other psychoactive substance abuse, uncomplicated: Secondary | ICD-10-CM

## 2018-04-10 DIAGNOSIS — X58XXXD Exposure to other specified factors, subsequent encounter: Secondary | ICD-10-CM

## 2018-04-10 DIAGNOSIS — T189XXD Foreign body of alimentary tract, part unspecified, subsequent encounter: Secondary | ICD-10-CM

## 2018-04-10 DIAGNOSIS — G2589 Other specified extrapyramidal and movement disorders: Secondary | ICD-10-CM

## 2018-04-10 DIAGNOSIS — T426X2D Poisoning by other antiepileptic and sedative-hypnotic drugs, intentional self-harm, subsequent encounter: Secondary | ICD-10-CM

## 2018-04-10 DIAGNOSIS — Z720 Tobacco use: Secondary | ICD-10-CM

## 2018-04-10 DIAGNOSIS — T43212D Poisoning by selective serotonin and norepinephrine reuptake inhibitors, intentional self-harm, subsequent encounter: Secondary | ICD-10-CM

## 2018-04-10 DIAGNOSIS — K219 Gastro-esophageal reflux disease without esophagitis: Secondary | ICD-10-CM

## 2018-04-10 DIAGNOSIS — T405X2D Poisoning by cocaine, intentional self-harm, subsequent encounter: Secondary | ICD-10-CM

## 2018-04-10 NOTE — Progress Notes (Signed)
Patient swallowed a foreign body that has not caused a GI perforation. Wound not recommend any surgical intervention. Ok to continue bowel regimen.   Franne Forts, [facility] 04/10/2018, 12:00 PM Pager: [phone] Mon 7:00 am -11:30 AM Tues-Fri 7:00 am-4:30 pm Sat-Sun 7:00 am-11:30 am

## 2018-04-10 NOTE — Progress Notes (Signed)
   Subjective: He states that he does not have any acute complaints.  He did have multiple episodes of diarrhea after taking the GoLYTELY yesterday.  But is overall feeling much better.  He denies any chest pain and shortness of breath.  Objective:  Vital signs in last 24 hours: Vitals:   04/09/18 1210 04/09/18 1726 04/09/18 2139 04/10/18 0438  BP: 130/65 115/69 131/79 108/61  Pulse: 79 88 90 (!) 57  Resp: (!) 23 18 16 15   Temp:   98.9 F (37.2 C) 98.7 F (37.1 C)  TempSrc:   Esophageal Oral  SpO2: 100% 97% 98% 97%  Weight:   100 kg   Height:       Physical Exam  Constitutional: He appears well-developed and well-nourished.  HENT:  Head: Normocephalic and atraumatic.  Eyes: EOM are normal.  Neck: Normal range of motion. Neck supple.  Cardiovascular: Normal rate, regular rhythm and normal heart sounds.  Pulmonary/Chest: Effort normal and breath sounds normal.  Musculoskeletal: He exhibits no edema or tenderness.  Neurological: He is alert.  Skin: Skin is warm and dry.  Psychiatric: He has a normal mood and affect. His behavior is normal.    Assessment/Plan:  Principal Problem:   Intentional drug overdose (HCC) Active Problems:   Serotonin syndrome   Major depressive disorder, recurrent severe without psychotic features (HCC)   Ingestion of foreign body  Mr. Malacara was admitted for serotonin syndrome secondary to intentional overdose with Lamictal, Effexor,andcocaine.Heremainshemodynamically stable.  He does have a retained 5 mm unknown foreign body which is demonstrated little movement compared to prior imaging.    He underwent an enteroscopy yesterday which was unsuccessful in removing the foreign object.  He was given GoLYTELY to try and increase his bowel movements loose in his stool and attempt to help the object past.  Repeat abdominal x-ray does show little movement of the foreign object.  Consulted surgery who did not recommend further surgical evaluation at  this time.  Serotonin Syndrome 2/2 intentional overdose: Resolved 1. Continue suicide precautions 2. Awaiting psych placement  Likely ingested foreign body: Currently asymptomatic 1.  Continue to monitor.  He will likely need a repeat x-ray in the near future but this can be done as an outpatient.  Tobacco Use: 1.  Continue nicotine 21 mg patch/day  Polysubstance Abuse: 1. Continue to recommend complete cessation  GERD:  1.ContinueProtonix 20mg  daily  [facility]  Synetta Shadow, MD 04/10/2018, 6:47 AM Pager: [phone]

## 2018-04-10 NOTE — Clinical Social Work Note (Signed)
CSW continuing search for psych facility for patient:  *Followed up with [facility] facility [phone]) to determine if they have any beds - referral sent  10/22 CSW advised that admissions person gone for the day and to call back tomorrow.   *Call made to [facility]  [phone]) and referral faxed [phone]).  Genelle Bal, MSW, LCSW Licensed Clinical Social Worker Clinical Social Work Department [employer] [phone]

## 2018-04-10 NOTE — Anesthesia Postprocedure Evaluation (Signed)
Anesthesia Post Note  Patient: David Mcconnell  Procedure(s) Performed: ENTEROSCOPY (N/A )     Patient location during evaluation: PACU Anesthesia Type: MAC Level of consciousness: awake and alert Pain management: pain level controlled Vital Signs Assessment: post-procedure vital signs reviewed and stable Respiratory status: spontaneous breathing, nonlabored ventilation, respiratory function stable and patient connected to nasal cannula oxygen Cardiovascular status: stable and blood pressure returned to baseline Postop Assessment: no apparent nausea or vomiting Anesthetic complications: no    Last Vitals:  Vitals:   04/09/18 2139 04/10/18 0438  BP: 131/79 108/61  Pulse: 90 (!) 57  Resp: 16 15  Temp: 37.2 C 37.1 C  SpO2: 98% 97%    Last Pain:  Vitals:   04/10/18 0438  TempSrc: Oral  PainSc:                  [name]

## 2018-04-11 ENCOUNTER — Encounter (HOSPITAL_COMMUNITY): Payer: Self-pay | Admitting: Gastroenterology

## 2018-04-11 ENCOUNTER — Inpatient Hospital Stay (HOSPITAL_COMMUNITY): Payer: Self-pay

## 2018-04-11 NOTE — Progress Notes (Signed)
Received call from CT wanting to know if MD wants to do CT pelvis as well,in addtion to CT abd. Text paged MD on call. Armas Mcbee, Drinda Butts, Charity fundraiser

## 2018-04-11 NOTE — Progress Notes (Signed)
   Subjective: Mr. Glassco denies any acute complaints including SOB, chest pain, abdominal pain. He states that he does remember stabbing himself with a pen 1 year ago in prison. He states that he tried to see if a fistula had formed. He thinks that this is perhaps the foreign object now in his abdomen.   Objective:  Vital signs in last 24 hours: Vitals:   04/10/18 1931 04/11/18 0522 04/11/18 0944 04/11/18 0947  BP: (!) 163/85 (!) 95/58  (!) 123/51  Pulse: 99 (!) 53 65 66  Resp: 19 18 18 18   Temp: 98.5 F (36.9 C) 98.6 F (37 C)  98.5 F (36.9 C)  TempSrc: Oral   Oral  SpO2: 100% 100% 100% 99%  Weight: 100.1 kg     Height:       Physical Exam  Constitutional: He appears well-developed and well-nourished.  HENT:  Head: Normocephalic and atraumatic.  Eyes: EOM are normal.  Neck: Normal range of motion.  Cardiovascular: Normal rate, regular rhythm and normal heart sounds.  Pulmonary/Chest: Effort normal and breath sounds normal.  Abdominal: Soft. Bowel sounds are normal. He exhibits no distension. There is no tenderness.  Musculoskeletal: He exhibits no edema or tenderness.  Neurological: He is alert.  Skin: Skin is warm and dry.  Psychiatric: He has a normal mood and affect. His behavior is normal.  Nursing note and vitals reviewed.   Assessment/Plan:  Principal Problem:   Intentional drug overdose (HCC) Active Problems:   Serotonin syndrome   Major depressive disorder, recurrent severe without psychotic features (HCC)   Foreign body in digestive system  Mr. Gervasi was admitted for serotonin syndrome secondary to intentional overdose with Lamictal, Effexor,andcocaine.Heremainshemodynamically stable and symptoms have resolved.  He is now s/p enteroscopy and Golytely prep which does not appear to have moved retained metallic object on Xray. We will continue weekly serial abdominal x-rays to monitor passage of object.   Serotonin Syndrome 2/2 intentional overdose:  Resolved 1. Continue suicide precautions 2. Awaiting psych placement  Likely ingested foreign body: Currently asymptomatic 1.  Continue to monitor with serial abdominal x-rays.  Tobacco Use: 1.Continuenicotine 21 mg patch/day  Polysubstance Abuse: 1. Continue to recommend complete cessation  GERD:  1.ContinueProtonix 20mg  daily  [facility]  Synetta Shadow, MD 04/11/2018, 2:16 PM Pager: [phone]

## 2018-04-11 NOTE — Clinical Social Work Note (Signed)
CSW visited Mr. Daft room at his request. Patient concerned that he has been seen by psychiatry in several days and this was discussed. CSW advised patient that a call was being made by unit assistant director regarding this concern. CSW later advised that order placed for patient to be seen by psychiatry by MD with teaching service. Patient updated. CSW will continue to follow and seek psychiatric placement for patient.  Genelle Bal, MSW, LCSW Licensed Clinical Social Worker Clinical Social Work Department [employer] [phone]

## 2018-04-11 NOTE — Progress Notes (Signed)
Patient has cell phone charger plugged in on side of bed and is charging his cell phone. Per patient,he had the charger for a few days now.Explained to patient that he cannot have charger in the room,and that if he needs to have his phone charged, staff will have to charge it at the Nurses station,patient verbalized understanding. Charger cord (without the head) placed together with his other belongings at the nurses station. Jama Mcmiller, Drinda Butts, Charity fundraiser

## 2018-04-12 DIAGNOSIS — T188XXD Foreign body in other parts of alimentary tract, subsequent encounter: Secondary | ICD-10-CM

## 2018-04-12 DIAGNOSIS — Z888 Allergy status to other drugs, medicaments and biological substances status: Secondary | ICD-10-CM

## 2018-04-12 DIAGNOSIS — T426X2A Poisoning by other antiepileptic and sedative-hypnotic drugs, intentional self-harm, initial encounter: Secondary | ICD-10-CM

## 2018-04-12 DIAGNOSIS — F419 Anxiety disorder, unspecified: Secondary | ICD-10-CM

## 2018-04-12 MED ORDER — PANTOPRAZOLE SODIUM 20 MG PO TBEC: 20.0000 mg | DELAYED_RELEASE_TABLET | Freq: Every day | ORAL | 0 refills | Status: DC

## 2018-04-12 NOTE — Progress Notes (Signed)
Reviewed AVS discharge instructions with patient/caregiver. Patient/caregiver verbalizes understanding of instructions received. AVS and prescriptions received by patient/caregiver. Peripheral IV removed by bedside RN, site benign. Patient states he does not need an escort or wheelchair for discharge.

## 2018-04-12 NOTE — Consult Note (Signed)
[facility] Psych Consult Progress Note  04/12/2018 11:46 AM David Mcconnell  MRN:  [id number] Subjective:   David Mcconnell was last seen on 10/14 by the psychiatry consult service for an intentional overdose of Lamictal, Effexor and cocaine in the setting of relationship strain. UDS was positive for cocaine and BAL was negative on admission. His hospital course was complicated by serotonin syndrome. He has a foreign body that is likely chronic in nature and no further medical interventions are recommended. He is medically cleared. He was last admitted to [facility] in 08/2009 for suicide attempt by Unasyn overdose and cutting his wrist in the setting of legal charges (repeat felon).   On interview, David Mcconnell reports that he took 4 pills of Effexor and Lamictal.  He reports that he was not trying to harm himself but instead was trying to make a point to his fiance.  He reports that they had an argument because she told him that he should be able to easily quit smoking so he took her prescribed medications to prove a point.  He reports that he wanted to hypothetically show her how hard it would be for her to abruptly stop her medications if they were not available to her.  He reports that they are now on good terms.  He reports a history of depression and anxiety while he was incarcerated.  He reports that he is on parole for 9 months.  He takes his psychotropic medications as prescribed although he has not had them since hospitalization.  He reports a stable mood.  He denies SI, HI or AVH.  He denies problems with sleep or appetite.  He provides verbal consent to speak to his fiance.  She reports that she has no concerns for his safety.  She is willing to monitor him 24/7 until he follows up with his outpatient psychiatrist and therapist.  He is followed by [facility].  Principal Problem: Intentional drug overdose (HCC) Diagnosis:   Patient Active Problem List   Diagnosis Date Noted  . Foreign body in digestive system  [T18.9XXA]   . Major depressive disorder, recurrent severe without psychotic features (HCC) [F33.2] 04/01/2018  . Serotonin syndrome [G25.79] 03/31/2018  . Intentional drug overdose (HCC) [T50.902A]    Total Time spent with patient: 15 minutes  Past Psychiatric History: Depression and polysubstance abuse.   Past Medical History:  Past Medical History:  Diagnosis Date  . Cocaine abuse [facility])     Past Surgical History:  Procedure Laterality Date  . ENTEROSCOPY N/A 04/09/2018   Procedure: ENTEROSCOPY;  Surgeon: Mansouraty, Netty Starring., MD;  Location: [facility] ENDOSCOPY;  Service: Gastroenterology;  Laterality: N/A;   Family History: History reviewed. No pertinent family history. Family Psychiatric  History: None Social History:  Social History   Substance and Sexual Activity  Alcohol Use Not Currently     Social History   Substance and Sexual Activity  Drug Use Yes  . Types: Cocaine    Social History   Socioeconomic History  . Marital status: Single    Spouse name: Not on file  . Number of children: Not on file  . Years of education: Not on file  . Highest education level: Not on file  Occupational History  . Not on file  Social Needs  . Financial resource strain: Not on file  . Food insecurity:    Worry: Not on file    Inability: Not on file  . Transportation needs:    Medical: Not on file  Non-medical: Not on file  Tobacco Use  . Smoking status: Current Some Day Smoker  . Smokeless tobacco: Never Used  Substance and Sexual Activity  . Alcohol use: Not Currently  . Drug use: Yes    Types: Cocaine  . Sexual activity: Not on file  Lifestyle  . Physical activity:    Days per week: Not on file    Minutes per session: Not on file  . Stress: Not on file  Relationships  . Social connections:    Talks on phone: Not on file    Gets together: Not on file    Attends religious service: Not on file    Active member of club or organization: Not on file    Attends  meetings of clubs or organizations: Not on file    Relationship status: Not on file  Other Topics Concern  . Not on file  Social History Narrative  . Not on file    Sleep: Good  Appetite:  Good  Current Medications: Current Facility-Administered Medications  Medication Dose Route Frequency Provider Last Rate Last Dose  . acetaminophen (TYLENOL) tablet 650 mg  650 mg Oral Q6H PRN Levora Dredge, MD   650 mg at 04/08/18 1802   Or  . acetaminophen (TYLENOL) suppository 650 mg  650 mg Rectal Q6H PRN Levora Dredge, MD      . menthol-cetylpyridinium (CEPACOL) lozenge 3 mg  1 lozenge Oral PRN Mansouraty, Netty Starring., MD      . nicotine (NICODERM CQ - dosed in mg/24 hours) patch 21 mg  21 mg Transdermal Daily Synetta Shadow, MD   21 mg at 04/12/18 1037  . ondansetron (ZOFRAN) tablet 4 mg  4 mg Oral Q6H PRN Levora Dredge, MD   4 mg at 04/01/18 0024   Or  . ondansetron (ZOFRAN) injection 4 mg  4 mg Intravenous Q6H PRN Levora Dredge, MD   4 mg at 03/31/18 1335  . pantoprazole (PROTONIX) EC tablet 20 mg  20 mg Oral Daily Angelita Ingles, MD   20 mg at 04/12/18 1037  . polyethylene glycol-electrolytes (NuLYTELY/GoLYTELY) solution 4,000 mL  4,000 mL Oral Once Synetta Shadow, MD        Lab Results: No results found for this or any previous visit (from the past 48 hour(s)).  Blood Alcohol level:  Lab Results  Component Value Date   ETH <10 03/30/2018   ETH  03/31/2010    <5        LOWEST DETECTABLE LIMIT FOR SERUM ALCOHOL IS 5 mg/dL FOR MEDICAL PURPOSES ONLY    Musculoskeletal: Strength & Muscle Tone: within normal limits Gait & Station: normal Patient leans: N/A  Psychiatric Specialty Exam: Physical Exam  Nursing note and vitals reviewed. Constitutional: He is oriented to person, place, and time. He appears well-developed and well-nourished.  HENT:  Head: Normocephalic and atraumatic.  Neck: Normal range of motion.  Respiratory: Effort normal.  Musculoskeletal:  Normal range of motion.  Neurological: He is alert and oriented to person, place, and time.  Psychiatric: He has a normal mood and affect. His speech is normal and behavior is normal. Judgment and thought content normal. Cognition and memory are normal.    Review of Systems  Cardiovascular: Negative for chest pain.  Gastrointestinal: Negative for abdominal pain, constipation, diarrhea, nausea and vomiting.  Psychiatric/Behavioral: Positive for substance abuse. Negative for depression, hallucinations and suicidal ideas. The patient is not nervous/anxious and does not have insomnia.   All other systems reviewed and  are negative.   Blood pressure 131/64, pulse 65, temperature 98.6 F (37 C), temperature source Oral, resp. rate 19, height 6' (1.829 m), weight 100.3 kg, SpO2 99 %.Body mass index is 29.99 kg/m.  General Appearance: Fairly Groomed, young, African American male, wearing paper hospital scrubs with multiple tattoos who is sitting on a couch. NAD.   Eye Contact:  Good  Speech:  Clear and Coherent and Normal Rate  Volume:  Normal  Mood:  Euthymic  Affect:  Appropriate and Congruent  Thought Process:  Goal Directed, Linear and Descriptions of Associations: Intact  Orientation:  Full (Time, Place, and Person)  Thought Content:  Logical  Suicidal Thoughts:  No  Homicidal Thoughts:  No  Memory:  Immediate;   Good Recent;   Good Remote;   Good  Judgement:  Fair  Insight:  Fair  Psychomotor Activity:  Normal  Concentration:  Concentration: Good and Attention Span: Good  Recall:  Good  Fund of Knowledge:  NA  Language:  Good  Akathisia:  No  Handed:  Right  AIMS (if indicated):   N/A  Assets:  Communication Skills Desire for Improvement Housing Intimacy Physical Health Social Support  ADL's:  Intact  Cognition:  WNL  Sleep:   Okay   Assessment:  David Mcconnell is a 37 y.o. male who was admitted with intentional overdose of Lamictal, Effexor and cocaine in the setting  of relationship stressors. His hospital course was complicated by serotonin syndrome. He has been a difficult placement for an inpatient psychiatric hospitalization. At this time he is past the acute crisis period. He appears to have impulsively overdosed due to poor judgement. He does not appear clinically depressed and denies mood symptoms. He denies SI, HI or AVH. He does not warrant inpatient psychiatric hospitalization at this time. He will follow up with his outpatient psychiatrist and therapist.    Treatment Plan Summary: -Patient should follow up with [facility] for medication management and therapy.  -Restart home medications: Wellbutrin 150 mg daily for depression, Remeron 7.5-15 mg qhs PRN for insomnia and Atarax 25 mg TID PRN for anxiety.  -EKG reviewed and QTc 395 on 10/12. Please closely monitor when starting or increasing QTc prolonging agents.  -Patient is psychiatrically cleared. Psychiatry will sign off on patient at this time. Please consult psychiatry again as needed.    Cherly Beach, DO 04/12/2018, 11:46 AM

## 2018-04-12 NOTE — Progress Notes (Signed)
Called back to review new scan on this patient who has a FB in his abdomen.  His new CT shows this appears to be lodged in the transverse duodenum  No evidence of perforation, free air, or other concerning findings. He had an endoscopy that went past this area and no FB was noted on scope.  This is likely chronic and given no acute symptoms, no surgical intervention is warranted unless patient were at some point to developed abdominal symptoms.  D/W primary service.  Letha Cape 10:40 AM 04/12/2018

## 2018-04-12 NOTE — Progress Notes (Signed)
   Subjective: David Mcconnell denies any acute complaints including abdominal pain, shortness of breath, chest pain. He denies suicidal and homicidal ideation.  Objective:  Vital signs in last 24 hours: Vitals:   04/11/18 1717 04/11/18 2058 04/11/18 2100 04/12/18 0433  BP: (!) 141/69 127/78  128/76  Pulse: 73 74  75  Resp: 20 17  17   Temp: 98.2 F (36.8 C) 98.9 F (37.2 C)  98.2 F (36.8 C)  TempSrc: Oral Oral  Oral  SpO2: 100% 100%  99%  Weight:   100 kg 100.3 kg  Height:       Physical Exam  Constitutional: He appears well-developed and well-nourished.  HENT:  Head: Normocephalic and atraumatic.  Eyes: EOM are normal.  Neck: Normal range of motion.  Cardiovascular: Normal rate, regular rhythm and normal heart sounds.  Pulmonary/Chest: Effort normal and breath sounds normal.  Abdominal: Soft. Bowel sounds are normal. He exhibits no distension. There is no tenderness.  Musculoskeletal: He exhibits no edema or tenderness.  Neurological: He is alert.  Skin: Skin is warm and dry.  Psychiatric: He has a normal mood and affect. His behavior is normal.  Nursing note and vitals reviewed.   Assessment/Plan:  Principal Problem:   Intentional drug overdose (HCC) Active Problems:   Serotonin syndrome   Major depressive disorder, recurrent severe without psychotic features (HCC)   Foreign body in digestive system  David Mcconnell was admitted for serotonin syndrome secondary to intentional overdose and incidentally found to have a foreign object in his abdomen.Marland KitchenHeremainshemodynamically stable and symptoms have resolved. CT of the abdomen showed a radiopaque density embedded within the anterior wall of the transverse duodenum.  There is no surrounding fluid or gas collection. Consulted GI and surgery who both believed that no further intervention was needed unless he is having symptoms. I explained to David Mcconnell that it is pertinent to let any provider know that he may still have  retained the metallic object prior to obtaining any further images. He expressed understanding and agreement. Psych saw him today and believed that he his mental capacity had improved and that he can be discharged home.  Serotonin Syndrome 2/2 intentional overdose: Resolved  Likely ingested foreign body:Currently asymptomatic. No further imaging needed.  Tobacco Use: 1.Encouraged continued tobacco cessation post-discharge.  Polysubstance Abuse: 1. Continue to recommend complete cessation   GERD:  1.ContinueProtonix 20mg  daily  Dispo: Anticipated dischargetoday.   Synetta Shadow, MD 04/12/2018, 6:41 AM Pager: [phone]

## 2018-04-12 NOTE — Clinical Social Work Note (Signed)
CSW informed by nursing that patient cleared by psych and will discharge home today. CSW signing off as no further SW intervention services needed.  Genelle Bal, MSW, LCSW Licensed Clinical Social Worker Clinical Social Work Department [employer] [phone]

## 2018-04-18 ENCOUNTER — Emergency Department (HOSPITAL_COMMUNITY)
Admission: EM | Admit: 2018-04-18 | Discharge: 2018-04-21 | Disposition: A | Discharge status: Home / Self Care | Payer: Self-pay | Attending: Psychiatry | Admitting: Psychiatry

## 2018-04-18 ENCOUNTER — Emergency Department (HOSPITAL_COMMUNITY): Payer: Self-pay

## 2018-04-18 ENCOUNTER — Encounter (HOSPITAL_COMMUNITY): Payer: Self-pay | Admitting: Emergency Medicine

## 2018-04-18 DIAGNOSIS — Y999 Unspecified external cause status: Secondary | ICD-10-CM | POA: Insufficient documentation

## 2018-04-18 DIAGNOSIS — Z9889 Other specified postprocedural states: Secondary | ICD-10-CM | POA: Insufficient documentation

## 2018-04-18 DIAGNOSIS — R45851 Suicidal ideations: Secondary | ICD-10-CM | POA: Insufficient documentation

## 2018-04-18 DIAGNOSIS — Z79899 Other long term (current) drug therapy: Secondary | ICD-10-CM | POA: Insufficient documentation

## 2018-04-18 DIAGNOSIS — F17200 Nicotine dependence, unspecified, uncomplicated: Secondary | ICD-10-CM | POA: Insufficient documentation

## 2018-04-18 DIAGNOSIS — T1491XA Suicide attempt, initial encounter: Secondary | ICD-10-CM

## 2018-04-18 DIAGNOSIS — Y929 Unspecified place or not applicable: Secondary | ICD-10-CM | POA: Insufficient documentation

## 2018-04-18 DIAGNOSIS — T182XXA Foreign body in stomach, initial encounter: Secondary | ICD-10-CM | POA: Insufficient documentation

## 2018-04-18 DIAGNOSIS — X788XXA Intentional self-harm by other sharp object, initial encounter: Secondary | ICD-10-CM | POA: Insufficient documentation

## 2018-04-18 DIAGNOSIS — Z888 Allergy status to other drugs, medicaments and biological substances status: Secondary | ICD-10-CM | POA: Insufficient documentation

## 2018-04-18 DIAGNOSIS — T189XXA Foreign body of alimentary tract, part unspecified, initial encounter: Secondary | ICD-10-CM

## 2018-04-18 DIAGNOSIS — F1414 Cocaine abuse with cocaine-induced mood disorder: Secondary | ICD-10-CM | POA: Insufficient documentation

## 2018-04-18 DIAGNOSIS — F332 Major depressive disorder, recurrent severe without psychotic features: Secondary | ICD-10-CM | POA: Insufficient documentation

## 2018-04-18 DIAGNOSIS — F1410 Cocaine abuse, uncomplicated: Secondary | ICD-10-CM | POA: Diagnosis present

## 2018-04-18 DIAGNOSIS — S31122A Laceration of abdominal wall with foreign body, epigastric region without penetration into peritoneal cavity, initial encounter: Secondary | ICD-10-CM | POA: Insufficient documentation

## 2018-04-18 LAB — ACETAMINOPHEN LEVEL: Acetaminophen (Tylenol), Serum: <10 ug/mL — ABNORMAL LOW (ref 10–30)

## 2018-04-18 LAB — COMPREHENSIVE METABOLIC PANEL
ALBUMIN: 4.6 g/dL (ref 3.5–5.0)
ALT: 37 U/L (ref 0–44)
ANION GAP: 13 (ref 5–15)
AST: 21 U/L (ref 15–41)
Alkaline Phosphatase: 67 U/L (ref 38–126)
BILIRUBIN TOTAL: 0.6 mg/dL (ref 0.3–1.2)
BUN: 11 mg/dL (ref 6–20)
CO2: 22 mmol/L (ref 22–32)
Calcium: 9.4 mg/dL (ref 8.9–10.3)
Chloride: 104 mmol/L (ref 98–111)
Creatinine, Ser: 1.18 mg/dL (ref 0.61–1.24)
GFR calc non Af Amer: >60 mL/min (ref >60)
GLUCOSE: 95 mg/dL (ref 70–99)
POTASSIUM: 3.6 mmol/L (ref 3.5–5.1)
Sodium: 139 mmol/L (ref 135–145)
TOTAL PROTEIN: 7.4 g/dL (ref 6.5–8.1)

## 2018-04-18 LAB — CBC WITH DIFFERENTIAL/PLATELET
Abs Immature Granulocytes: 0.05 10*3/uL (ref 0.00–0.07)
BASOS ABS: 0 10*3/uL (ref 0.0–0.1)
Basophils Relative: 0 %
EOS ABS: 0.1 10*3/uL (ref 0.0–0.5)
EOS PCT: 1 %
HEMATOCRIT: 44.8 % (ref 39.0–52.0)
Hemoglobin: 14.1 g/dL (ref 13.0–17.0)
Immature Granulocytes: 0 %
LYMPHS ABS: 1.2 10*3/uL (ref 0.7–4.0)
Lymphocytes Relative: 11 %
MCH: 27.5 pg (ref 26.0–34.0)
MCHC: 31.5 g/dL (ref 30.0–36.0)
MCV: 87.5 fL (ref 80.0–100.0)
Monocytes Absolute: 0.6 10*3/uL (ref 0.1–1.0)
Monocytes Relative: 5 %
NRBC: 0 % (ref 0.0–0.2)
Neutro Abs: 9.3 10*3/uL — ABNORMAL HIGH (ref 1.7–7.7)
Neutrophils Relative %: 83 %
Platelets: 240 10*3/uL (ref 150–400)
RBC: 5.12 MIL/uL (ref 4.22–5.81)
RDW: 13.2 % (ref 11.5–15.5)
WBC: 11.3 10*3/uL — AB (ref 4.0–10.5)

## 2018-04-18 LAB — RAPID URINE DRUG SCREEN, HOSP PERFORMED
Amphetamines: NOT DETECTED
BENZODIAZEPINES: NOT DETECTED
Barbiturates: NOT DETECTED
COCAINE: POSITIVE — AB
OPIATES: NOT DETECTED
Tetrahydrocannabinol: NOT DETECTED

## 2018-04-18 LAB — ETHANOL: Alcohol, Ethyl (B): 12 mg/dL — ABNORMAL HIGH (ref <10)

## 2018-04-18 LAB — SALICYLATE LEVEL: Salicylate Lvl: <7 mg/dL (ref 2.8–30.0)

## 2018-04-18 MED ORDER — THIAMINE HCL 100 MG/ML IJ SOLN: 100.0000 mg | Freq: Every day | INTRAMUSCULAR | Status: DC

## 2018-04-18 MED ORDER — LORAZEPAM 1 MG PO TABS
0.0000 mg | ORAL_TABLET | Freq: Four times a day (QID) | ORAL | Status: DC
  Administered 2018-04-18 – 2018-04-19 (×2): 1 mg via ORAL
  Filled 2018-04-18 (×2): qty 1

## 2018-04-18 MED ORDER — LORAZEPAM 2 MG/ML IJ SOLN: 0.0000 mg | Freq: Two times a day (BID) | INTRAMUSCULAR | Status: DC

## 2018-04-18 MED ORDER — TETANUS-DIPHTH-ACELL PERTUSSIS 5-2.5-18.5 LF-MCG/0.5 IM SUSP
0.5000 mL | Freq: Once | INTRAMUSCULAR | Status: AC
  Administered 2018-04-18: 0.5 mL via INTRAMUSCULAR
  Filled 2018-04-18: qty 0.5

## 2018-04-18 MED ORDER — VITAMIN B-1 100 MG PO TABS
100.0000 mg | ORAL_TABLET | Freq: Every day | ORAL | Status: DC
  Administered 2018-04-18 – 2018-04-20 (×2): 100 mg via ORAL
  Filled 2018-04-18 (×2): qty 1

## 2018-04-18 MED ORDER — LORAZEPAM 1 MG PO TABS: 0.0000 mg | ORAL_TABLET | Freq: Two times a day (BID) | ORAL | Status: DC

## 2018-04-18 MED ORDER — HYDROXYZINE HCL 25 MG PO TABS
25.0000 mg | ORAL_TABLET | Freq: Three times a day (TID) | ORAL | Status: DC | PRN
  Administered 2018-04-19 – 2018-04-20 (×3): 25 mg via ORAL
  Filled 2018-04-18 (×3): qty 1

## 2018-04-18 MED ORDER — IOPAMIDOL (ISOVUE-300) INJECTION 61%
INTRAVENOUS | Status: AC
  Filled 2018-04-18: qty 100

## 2018-04-18 MED ORDER — PANTOPRAZOLE SODIUM 20 MG PO TBEC
20.0000 mg | DELAYED_RELEASE_TABLET | Freq: Every day | ORAL | Status: DC
  Administered 2018-04-18 – 2018-04-21 (×3): 20 mg via ORAL
  Filled 2018-04-18 (×3): qty 1

## 2018-04-18 MED ORDER — LORAZEPAM 2 MG/ML IJ SOLN
0.0000 mg | Freq: Four times a day (QID) | INTRAMUSCULAR | Status: DC
  Administered 2018-04-19: 1 mg via INTRAVENOUS
  Filled 2018-04-18: qty 1

## 2018-04-18 MED ORDER — BUPROPION HCL ER (XL) 150 MG PO TB24
150.0000 mg | ORAL_TABLET | Freq: Every day | ORAL | Status: DC
  Administered 2018-04-18 – 2018-04-21 (×3): 150 mg via ORAL
  Filled 2018-04-18 (×3): qty 1

## 2018-04-18 MED ORDER — SODIUM CHLORIDE 0.9 % IJ SOLN
INTRAMUSCULAR | Status: AC
  Filled 2018-04-18: qty 50

## 2018-04-18 MED ORDER — ACETAMINOPHEN 325 MG PO TABS
650.0000 mg | ORAL_TABLET | Freq: Once | ORAL | Status: AC
  Administered 2018-04-18: 650 mg via ORAL
  Filled 2018-04-18: qty 2

## 2018-04-18 MED ORDER — IOPAMIDOL (ISOVUE-300) INJECTION 61%
100.0000 mL | Freq: Once | INTRAVENOUS | Status: AC | PRN
  Administered 2018-04-18: 100 mL via INTRAVENOUS

## 2018-04-18 MED ORDER — MIRTAZAPINE 7.5 MG PO TABS
7.5000 mg | ORAL_TABLET | Freq: Every evening | ORAL | Status: DC | PRN
  Administered 2018-04-18: 15 mg via ORAL
  Administered 2018-04-19: 7.5 mg via ORAL
  Filled 2018-04-18: qty 1
  Filled 2018-04-18 (×3): qty 2

## 2018-04-18 MED ORDER — NICOTINE 21 MG/24HR TD PT24
21.0000 mg | MEDICATED_PATCH | Freq: Once | TRANSDERMAL | Status: DC
  Administered 2018-04-18: 21 mg via TRANSDERMAL
  Filled 2018-04-18: qty 1

## 2018-04-18 MED ORDER — KETOROLAC TROMETHAMINE 15 MG/ML IJ SOLN
15.0000 mg | Freq: Once | INTRAMUSCULAR | Status: AC
  Administered 2018-04-18: 15 mg via INTRAMUSCULAR
  Filled 2018-04-18: qty 1

## 2018-04-18 MED ORDER — ACETAMINOPHEN 500 MG PO TABS
1000.0000 mg | ORAL_TABLET | Freq: Four times a day (QID) | ORAL | Status: DC | PRN
  Administered 2018-04-20 (×2): 1000 mg via ORAL
  Filled 2018-04-18 (×2): qty 2

## 2018-04-18 NOTE — ED Provider Notes (Addendum)
[facility]-EMERGENCY DEPT Provider Note   CSN: [id number] Arrival date & time: 04/18/18  1711     History   Chief Complaint Chief Complaint  Patient presents with  . Foreign Body  . IVC    HPI David Mcconnell is a 37 y.o. male.  The history is provided by the patient.  Mental Health Problem  Presenting symptoms: self-mutilation (stabbed his abdomen with nail/pen)   Degree of incapacity (severity):  Moderate Onset quality:  Sudden Timing:  Constant Progression:  Unchanged Chronicity:  Recurrent Context: not noncompliant and not stressful life event   Associated symptoms: poor judgment   Associated symptoms: no abdominal pain and no chest pain   Risk factors: hx of mental illness     Past Medical History:  Diagnosis Date  . Cocaine abuse [facility])     Patient Active Problem List   Diagnosis Date Noted  . Foreign body in digestive system   . Major depressive disorder, recurrent severe without psychotic features (HCC) 04/01/2018  . Serotonin syndrome 03/31/2018  . Intentional drug overdose [facility])     Past Surgical History:  Procedure Laterality Date  . ENTEROSCOPY N/A 04/09/2018   Procedure: ENTEROSCOPY;  Surgeon: Mansouraty, Netty Starring., MD;  Location: [facility] ENDOSCOPY;  Service: Gastroenterology;  Laterality: N/A;        Home Medications    Prior to Admission medications   Medication Sig Start Date End Date Taking? Authorizing Provider  buPROPion (WELLBUTRIN XL) 150 MG 24 hr tablet Take 150 mg by mouth daily.   Yes [provider]  hydrOXYzine (ATARAX/VISTARIL) 25 MG tablet Take 25 mg by mouth 3 (three) times daily as needed for anxiety.   Yes [provider]  mirtazapine (REMERON) 15 MG tablet Take 7.5-15 mg by mouth See admin instructions. Taking 1/2 (7.5 mg) to one tablet (15mg ) as needed at bedtime for sleep   Yes [provider]  pantoprazole (PROTONIX) 20 MG tablet Take 1 tablet (20 mg total) by mouth daily.  04/13/18 05/13/18 Yes Synetta Shadow, MD  acetaminophen (TYLENOL) 500 MG tablet Take 1,000 mg by mouth every 6 (six) hours as needed for mild pain.    [provider]  cyanocobalamin (,VITAMIN B-12,) 1000 MCG/ML injection Inject 1,000 mcg into the muscle every 30 (thirty) days.    [provider]    Family History No family history on file.  Social History Social History   Tobacco Use  . Smoking status: Current Some Day Smoker  . Smokeless tobacco: Never Used  Substance Use Topics  . Alcohol use: Not Currently  . Drug use: Yes    Types: Cocaine     Allergies   Tegretol [carbamazepine]   Review of Systems Review of Systems  Constitutional: Negative for chills and fever.  HENT: Negative for ear pain and sore throat.   Eyes: Negative for pain and visual disturbance.  Respiratory: Negative for cough and shortness of breath.   Cardiovascular: Negative for chest pain and palpitations.  Gastrointestinal: Negative for abdominal pain and vomiting.  Genitourinary: Negative for dysuria and hematuria.  Musculoskeletal: Negative for arthralgias and back pain.  Skin: Positive for wound. Negative for color change and rash.  Neurological: Negative for seizures and syncope.  Psychiatric/Behavioral: Positive for self-injury (stabbed his abdomen with nail/pen).  All other systems reviewed and are negative.    Physical Exam Updated Vital Signs  ED Triage Vitals  Enc Vitals Group     BP 04/18/18 1727 (!) 155/91  Pulse Rate 04/18/18 1727 98     Resp 04/18/18 1727 19     Temp 04/18/18 1727 98.3 F (36.8 C)     Temp Source 04/18/18 1727 Oral     SpO2 04/18/18 1727 100 %     Weight --      Height --      Head Circumference --      Peak Flow --      Pain Score 04/18/18 1729 0     Pain Loc --      Pain Edu? --      Excl. in GC? --     Physical Exam  Constitutional: He appears well-developed and well-nourished.  HENT:  Head: Normocephalic and atraumatic.    Eyes: Pupils are equal, round, and reactive to light. Conjunctivae and EOM are normal.  Neck: Normal range of motion. Neck supple.  Cardiovascular: Normal rate, regular rhythm, normal heart sounds and intact distal pulses.  No murmur heard. Pulmonary/Chest: Effort normal and breath sounds normal. No respiratory distress.  Abdominal: Soft. He exhibits no distension. There is tenderness (mild tenderness diffusely ).  Penetrating wound to abdomen   Musculoskeletal: Normal range of motion. He exhibits no edema.  Neurological: He is alert.  Skin: Skin is warm and dry. Capillary refill takes less than 2 seconds.  Psychiatric: His affect is blunt. His speech is delayed. He is withdrawn. He expresses impulsivity. He expresses suicidal ideation. He expresses suicidal plans.  Nursing note and vitals reviewed.    ED Treatments / Results  Labs (all labs ordered are listed, but only abnormal results are displayed) Labs Reviewed  CBC WITH DIFFERENTIAL/PLATELET - Abnormal; Notable for the following components:      Result Value   WBC 11.3 (*)    Neutro Abs 9.3 (*)    All other components within normal limits  ETHANOL - Abnormal; Notable for the following components:   Alcohol, Ethyl (B) 12 (*)    All other components within normal limits  RAPID URINE DRUG SCREEN, HOSP PERFORMED - Abnormal; Notable for the following components:   Cocaine POSITIVE (*)    All other components within normal limits  ACETAMINOPHEN LEVEL - Abnormal; Notable for the following components:   Acetaminophen (Tylenol), Serum <10 (*)    All other components within normal limits  COMPREHENSIVE METABOLIC PANEL  SALICYLATE LEVEL    EKG None  Radiology Dg Abdomen 1 View  Result Date: 04/18/2018 CLINICAL DATA:  Evaluate for foreign bodies. EXAM: ABDOMEN - 1 VIEW COMPARISON:  CT scan 04/11/2018 FINDINGS: The bowel gas pattern is unremarkable. I do not see any obvious free air but the hemidiaphragms are not included. There  are 5 metallic appearing radiodensities. One has the appearance of a finish nail. This is overlying the level of the L3 vertebral body. Two other objects could be part of a pen. Rounded disklike radiodensities are noted in the right upper quadrant and also in the lower pelvis. IMPRESSION: Multiple radiopaque foreign bodies, not seen on prior CT scan. Electronically Signed   By: Rudie Meyer M.D.   On: 04/18/2018 18:23   Ct Abdomen Pelvis W Contrast  Result Date: 04/18/2018 CLINICAL DATA:  Stabbing ink pen and nail through abdominal wall. EXAM: CT ABDOMEN AND PELVIS WITH CONTRAST TECHNIQUE: Multidetector CT imaging of the abdomen and pelvis was performed using the standard protocol following bolus administration of intravenous contrast. CONTRAST:  ISOVUE-300 IOPAMIDOL (ISOVUE-300) INJECTION 61% COMPARISON:  04/11/2018 and 04/11/2010 as well as KUB today. FINDINGS:  Lower chest: Lung bases are within normal. Hepatobiliary: Small calcified granuloma over the right lobe of the liver. Liver is otherwise unremarkable. Gallbladder and biliary tree are normal. Pancreas: Normal. Spleen: Normal. Adrenals/Urinary Tract: Adrenal glands are normal. Kidneys are normal in size without hydronephrosis or nephrolithiasis. Ureters and bladder are normal. Stomach/Bowel: Stomach is normal. 12 mm metallic density without significant change projecting over the anterior wall of the third portion of the duodenum. There is a dilated small bowel loop in the anterior mid abdomen just below the transverse colon and abutting the level of the umbilicus. This measures 5.6 cm in diameter and contains mild fecal material and is unchanged. This may be due to early/partial obstruction as transition point is not identified. There are 3 metallic foreign bodies over the midline abdominal wall just above the umbilicus immediately anterior and superior to the dilated small bowel loop. The largest metallic foreign body is linear measures 2.6 cm  in length. There is a small appendiceal stump with associated linear hypodensity likely from previous appendectomy. Suture line over a loop of transverse colon abutting the anterior abdominal wall in the midline above the umbilicus. There is a metallic foreign body projecting over the rectum. Note that there is no definite connection is seen between the transverse colon and dilated small bowel loop in the anterior mid abdomen. Vascular/Lymphatic: Normal. Reproductive: Normal. Other: No free fluid.  No free peritoneal air. Musculoskeletal: Normal. IMPRESSION: Total of 5 metallic foreign bodies as described. Three of these foreign bodies are over the midline anterior abdominal wall just above the umbilicus as it is difficult to tell whether these are within the peritoneum or within the soft tissues of the abdominal wall. No change in a small foreign body projecting over the anterior wall of the third portion of the duodenum. New small metallic foreign body projects over the rectum. No free peritoneal air. Dilated small bowel loop containing fecal material over the anterior mid abdomen abutting the level of the umbilicus and just deep to the foreign bodies. This is unchanged from 04/11/2018 and may be due to partial/early small bowel obstruction. Transition point not definitely identified. Postsurgical changes over the transverse colon and small bowel. Electronically Signed   By: Elberta Fortis M.D.   On: 04/18/2018 20:30    Procedures Procedures (including critical care time)  Medications Ordered in ED Medications  Tdap (BOOSTRIX) injection 0.5 mL (has no administration in time range)  iopamidol (ISOVUE-300) 61 % injection (has no administration in time range)  sodium chloride 0.9 % injection (has no administration in time range)  acetaminophen (TYLENOL) tablet 650 mg (has no administration in time range)  buPROPion (WELLBUTRIN XL) 24 hr tablet 150 mg (has no administration in time range)  acetaminophen  (TYLENOL) tablet 1,000 mg (has no administration in time range)  hydrOXYzine (ATARAX/VISTARIL) tablet 25 mg (has no administration in time range)  mirtazapine (REMERON) tablet 7.5-15 mg (has no administration in time range)  pantoprazole (PROTONIX) EC tablet 20 mg (has no administration in time range)  LORazepam (ATIVAN) injection 0-4 mg (has no administration in time range)    Or  LORazepam (ATIVAN) tablet 0-4 mg (has no administration in time range)  LORazepam (ATIVAN) injection 0-4 mg (has no administration in time range)    Or  LORazepam (ATIVAN) tablet 0-4 mg (has no administration in time range)  thiamine (VITAMIN B-1) tablet 100 mg (has no administration in time range)    Or  thiamine (B-1) injection 100 mg (has no administration  in time range)  nicotine (NICODERM CQ - dosed in mg/24 hours) patch 21 mg (has no administration in time range)  iopamidol (ISOVUE-300) 61 % injection 100 mL (100 mLs Intravenous Contrast Given 04/18/18 1944)     Initial Impression / Assessment and Plan / ED Course  I have reviewed the triage vital signs and the nursing notes.  Pertinent labs & imaging results that were available during my care of the patient were reviewed by me and considered in my medical decision making (see chart for details).     David Mcconnell is a 36 year old male with history of self-harm, depression who presents to the ED after stabbed himself with a pen and nail.  Patient with normal vitals.  No fever.  Patient with history of the same.  He denies swallowing any foreign objects or any rectal foreign objects.  Patient had some passing gas, no nausea, no vomiting.  Had a bowel movement this morning.  States that this occurred about 11 AM this morning.  Patient states that he was doing this to hurt himself.  Patient has flat a fact.  Has no signs of peritonitis on exam.  Has small puncture wound around the area of his old laparotomy scar.  Has some mild tenderness diffusely on  abdominal exam.  Otherwise exam is unremarkable.  KUB and CT scan showed multiple new foreign objects.  There was no free peritoneal air.  Patient with foreign objects likely in subcutaneous fat.  Has small foreign body in his rectum.  However no signs of perforation seen on CT scan.  Dr. Ezzard Standing with general surgery was consulted.  He discussed imaging with radiology over the phone as well.  At this time there is no need for any surgical intervention.  Patient with no free air.  No signs of peritonitis on exam.  Stabbing occurred about 11 hours ago.  At this time Dr. Ezzard Standing states that patient is medically cleared.  He can follow-up with surgery clinic in 1 week to discuss need for any intervention at that time.  No need for any antibiotics at this time.  Patient with no significant anemia, electrolyte abnormality, kidney injury.  Drug screen is positive for cocaine.  Patient currently medically cleared and will have psychiatry come evaluate the patient.  Home medications were ordered.  CIWA protocol in place.  IVC in place.  Psych states they pt meets inpt criteria, they state patient said he swallowed for pieces of metal since he has been here, will get repeat KUB. Pt handed off to Dr. Elesa Massed. Patient admits to swallowing 4 more pieces of metal, asymptomatic. Please see Dr. Barney Drain note for further care.   This chart was dictated using voice recognition software.  Despite best efforts to proofread,  errors can occur which can change the documentation meaning.   Final Clinical Impressions(s) / ED Diagnoses   Final diagnoses:  Suicidal behavior with attempted self-injury [facility])    ED Discharge Orders    None       Virgina Norfolk, DO 04/18/18 2204    Virgina Norfolk, DO 04/19/18 0028

## 2018-04-18 NOTE — ED Notes (Signed)
Attempted to notify Dr. Lockie Mola of pt refusing medical tx d/t only receiving tylenol or ibuprofen.  No answer.  Will continue attempts to notify.

## 2018-04-18 NOTE — BH Assessment (Addendum)
Assessment Note  David Mcconnell is an 37 y.o. male, who presents involuntary and unaccompanied to [facility]. Clinician asked the pt, "what brought you to the hospital?" Pt reported, last night after he got into an argument with his girlfriend he took the ink cartridge out of an ink pen in put it in his abdomen through a scar he has. Pt reported, while in the ED he became upset when he was told he was not getting the ink cartridge removed, after his initial X-ray he swallowed four 2.5 inch pieces of metal. Pt reported, denies inserting anything else than what he has disclosed. Pt denies, SI, HI, AVH and access to weapons.    Pt was IVC'd by ED provider. Per IVC paperwork: 'Patient with history of depression and self-harm presents following self-harm in which patient states he stuck a nail and pen into his abdomen. He is unsure why he did this but has history of same and concerns of SI attempt."   Pt denies abuse. Pt reported, drinking 1/4 of a beer, yesterday. Pt's BAL was 12 at 1759. Pt reported, smoking a pack of cigarettes, daily. Pt reported, smoking a pack of cigarettes, daily. Pt reported, being linked to [facility] for medication management. Pt reported, he was not taking his medications as prescribed. Pt reported, previous inpatient admission at [facility].   Pt presents quiet/awake in scrubs with logical/coherent speech. Pt's mood was helpless. Pt's affect was flat. Pt's thought process was circumstantial. Pt's thought process was impaired. Pt was oriented x4. Pt's concentration was fair. During the assessment would have extended pauses when answering questions. Pt's insight and impulse control are poor. Pt reported, he would be safe if discharged once the pen is removed from his abdomen.    Diagnosis: Major Depressive Disorder, recurrent severe without psychotic features.  Past Medical History:  Past Medical History:  Diagnosis Date  . Cocaine abuse [facility])     Past Surgical History:  Procedure  Laterality Date  . ENTEROSCOPY N/A 04/09/2018   Procedure: ENTEROSCOPY;  Surgeon: Mansouraty, Netty Starring., MD;  Location: [facility] ENDOSCOPY;  Service: Gastroenterology;  Laterality: N/A;    Family History: No family history on file.  Social History:  reports that he has been smoking. He has never used smokeless tobacco. He reports that he drank alcohol. He reports that he has current or past drug history. Drug: Cocaine.  Additional Social History:  Alcohol / Drug Use Pain Medications: See MAR Prescriptions: See MAR Over the Counter: See MAR History of alcohol / drug use?: Yes Substance #1 Name of Substance 1: Alochol 1 - Age of First Use: UTA 1 - Amount (size/oz): Pt reported, drinking 1/4 of a beer, yesterday.  1 - Frequency: UTA 1 - Duration: Ongoing.  1 - Last Use / Amount: Pt reported, yesterday. Substance #2 Name of Substance 2: Cigarettes. 2 - Age of First Use: UTA 2 - Amount (size/oz): Pt reported, smoking a pack of cigarettes, daily.  2 - Frequency: Daily. 2 - Duration: Ongoing. 2 - Last Use / Amount: Pt reported, daily.  Substance #3 Name of Substance 3: Cocaine.  3 - Age of First Use: UTA 3 - Amount (size/oz): Pts UDS is postive for cocaine.  3 - Frequency: UTA 3 - Duration: UTA 3 - Last Use / Amount: UTA  CIWA: CIWA-Ar BP: (!) 150/96 Pulse Rate: 84 COWS:    Allergies:  Allergies  Allergen Reactions  . Tegretol [Carbamazepine] Rash and Other (See Comments)    Blisters in mouth  Home Medications:  (Not in a hospital admission)  OB/GYN Status:  No LMP for male patient.  General Assessment Data Location of Assessment: [facility] ED TTS Assessment: In system Is this a Tele or Face-to-Face Assessment?: Face-to-Face Is this an Initial Assessment or a Re-assessment for this encounter?: Initial Assessment Patient Accompanied by:: N/A Language Other than English: No Living Arrangements: Other (Comment)(Ex-girlfriend.) What gender do you identify as?: Male Marital  status: Single Living Arrangements: Spouse/significant other Can pt return to current living arrangement?: Yes Admission Status: Involuntary Petitioner: ED Attending Is patient capable of signing voluntary admission?: No Referral Source: Self/Family/Friend Insurance type: Self-pay.      Crisis Care Plan Living Arrangements: Spouse/significant other Legal Guardian: Mother(Mary Vosler, mother. ) Name of Psychiatrist: Vesta Mixer. Name of Therapist: NA  Education Status Is patient currently in school?: No Is the patient employed, unemployed or receiving disability?: Unemployed  Risk to self with the past 6 months Suicidal Ideation: No(Pt denies. ) Has patient been a risk to self within the past 6 months prior to admission? : No Suicidal Intent: No Has patient had any suicidal intent within the past 6 months prior to admission? : No Is patient at risk for suicide?: No Suicidal Plan?: No Has patient had any suicidal plan within the past 6 months prior to admission? : No Access to Means: No What has been your use of drugs/alcohol within the last 12 months?: Cocaine, alcohol and cigarettes. Previous Attempts/Gestures: Yes How many times?: 1 Other Self Harm Risks: Swallowing four metal pieces, inserting ink cartridge from pen in his abdomen.  Triggers for Past Attempts: Unknown Intentional Self Injurious Behavior: Damaging Comment - Self Injurious Behavior: Swallowing four metal pieces, inserting ink cartridge from pen in his abdomen.  Family Suicide History: No Recent stressful life event(s): Other (Comment)(ink cartridge in of his stomach, swallowing metal pieces. ) Persecutory voices/beliefs?: No Depression: No(Pt denies. ) Depression Symptoms: (Pt denies. ) Substance abuse history and/or treatment for substance abuse?: No Suicide prevention information given to non-admitted patients: Not applicable  Risk to Others within the past 6 months Homicidal Ideation: No(Pt denies.  ) Does patient have any lifetime risk of violence toward others beyond the six months prior to admission? : No(Pt denies. ) Thoughts of Harm to Others: No Current Homicidal Intent: No Current Homicidal Plan: No Access to Homicidal Means: No Identified Victim: NA History of harm to others?: No Assessment of Violence: None Noted Violent Behavior Description: NA Does patient have access to weapons?: No(Pt denies. ) Criminal Charges Pending?: No Does patient have a court date: No Is patient on probation?: No  Psychosis Hallucinations: None noted Delusions: None noted  Mental Status Report Appearance/Hygiene: In scrubs Eye Contact: Fair Motor Activity: Unremarkable Speech: Logical/coherent Level of Consciousness: Quiet/awake Mood: Helpless Affect: Flat Anxiety Level: Minimal Thought Processes: Circumstantial Judgement: Impaired Orientation: Person, Place, Time, Situation Obsessive Compulsive Thoughts/Behaviors: None  Cognitive Functioning Concentration: Fair Memory: Recent Intact Is patient IDD: No Insight: Poor Impulse Control: Poor Appetite: Fair Sleep: No Change Total Hours of Sleep: 10 Vegetative Symptoms: None  ADLScreening [facility] Assessment Services) Patient's cognitive ability adequate to safely complete daily activities?: Yes Patient able to express need for assistance with ADLs?: Yes Independently performs ADLs?: Yes (appropriate for developmental age)     Prior Outpatient Therapy Prior Outpatient Therapy: Yes Prior Therapy Dates: Current Prior Therapy Facilty/Provider(s): [facility].  Reason for Treatment: Medication management. Does patient have an ACCT team?: No Does patient have Intensive In-House Services?  : No Does patient  have [facility] services? : Yes Does patient have P4CC services?: No  ADL Screening (condition at time of admission) Patient's cognitive ability adequate to safely complete daily activities?: Yes Is the patient deaf or have  difficulty hearing?: No Does the patient have difficulty seeing, even when wearing glasses/contacts?: No Does the patient have difficulty concentrating, remembering, or making decisions?: Yes Patient able to express need for assistance with ADLs?: Yes Does the patient have difficulty dressing or bathing?: No Independently performs ADLs?: Yes (appropriate for developmental age) Does the patient have difficulty walking or climbing stairs?: No Weakness of Legs: None Weakness of Arms/Hands: None  Home Assistive Devices/Equipment Home Assistive Devices/Equipment: None    Abuse/Neglect Assessment (Assessment to be complete while patient is alone) Abuse/Neglect Assessment Can Be Completed: Yes Physical Abuse: Denies(Pt denies. ) Verbal Abuse: Denies(Pt denies. ) Sexual Abuse: Denies(Pt denies. ) Exploitation of patient/patient's resources: Denies(Pt denies. ) Self-Neglect: Denies(Pt denies. )     Advance Directives (For Healthcare) Does Patient Have a Medical Advance Directive?: No          Disposition: Donell Sievert, PA recommends inpatient treatment. Disposition discussed with Dr. Lockie Mola and Joanie Coddington, RN. TTS to seek placement.    Disposition Initial Assessment Completed for this Encounter: Yes  On Site Evaluation by: Redmond Pulling, MS, LPC, CRC. Reviewed with Physician: Dr. Lockie Mola and Donell Sievert, PA.  Redmond Pulling 04/19/2018 12:55 AM   Redmond Pulling, MS, [facility], [facility] Triage Specialist [phone]

## 2018-04-18 NOTE — ED Triage Notes (Addendum)
Patient here from home with complaints of taking a ink pen and nail stabbing self in abdomen all the way through inside. Denies SI, states "I just do the same thing from time to time". Marks on abdomen noted.

## 2018-04-18 NOTE — ED Notes (Addendum)
Pt stated "I put ink pen cartridges in my abdomen.  I just can't help it.  I get to thinking about it and just do it.  I get depressed.  I haven't taken my meds in a while."  Pt verbally agreed to No Harm contract.

## 2018-04-18 NOTE — ED Notes (Signed)
Patient transported to CT via w/c. 

## 2018-04-18 NOTE — ED Notes (Signed)
Pt A &O x 3, presents with self harming behaviors, denies SI, HI or AVH.  Pt reports he intentionally pushed the inside of a ink pen through the middle of his stomach.  Pt admits to this behavior in the past, reports SI attempt 9 years ago, pushed a couple of things through his stomach.  Denies feeling hopeless and contracts for safety.  Diagnosed with Borderline Personality, Depression, Anxiety and Mood Swings he reports.  Admits to using cocaine and alcohol a few days ago.  Rates abd.pain as 7 on pain scale.  Monitoring for safety, Q 15 min checks in effect.

## 2018-04-18 NOTE — ED Notes (Signed)
Pt requesting pain med.  Informed will speak to EDP.  Spoke with Dr. Lockie Mola.  Per Dr. Lockie Mola, waiting until CT results.

## 2018-04-18 NOTE — ED Notes (Signed)
Pt states he swallowed 4 pieces of metal from home. Pt states he swallowed the metal because the doctor refuses to remove the pen he swallowed prior to coming to the hospital. Pt states he had metal in his socks prior to arrival on to the sappu.

## 2018-04-18 NOTE — ED Notes (Signed)
Bed: ZO10 Expected date:  Expected time:  Means of arrival:  Comments: EMS superficial puncture wounds

## 2018-04-18 NOTE — ED Notes (Signed)
Dr. Lockie Mola informed of pt's request for toradol.  Verbal order received.  See orders.

## 2018-04-18 NOTE — ED Notes (Signed)
Pt also stated "If he's not going to give me something more than tylenol or ibuprofen, I'm refusing medical tx."

## 2018-04-18 NOTE — ED Notes (Signed)
Pt requesting nicotine patch.

## 2018-04-18 NOTE — ED Notes (Signed)
Dr Lockie Mola and Charge Nurse Terri notified that pt swallowed 4 pieces of metal.  Pending abdominal film.

## 2018-04-19 ENCOUNTER — Emergency Department (HOSPITAL_COMMUNITY): Payer: Self-pay | Admitting: Anesthesiology

## 2018-04-19 ENCOUNTER — Encounter (HOSPITAL_COMMUNITY): Payer: Self-pay | Admitting: Certified Registered"

## 2018-04-19 ENCOUNTER — Encounter (HOSPITAL_COMMUNITY)
Admission: EM | Disposition: A | Discharge status: Home / Self Care | Payer: Self-pay | Source: Home / Self Care | Attending: Emergency Medicine

## 2018-04-19 DIAGNOSIS — T1491XA Suicide attempt, initial encounter: Secondary | ICD-10-CM

## 2018-04-19 DIAGNOSIS — F1414 Cocaine abuse with cocaine-induced mood disorder: Secondary | ICD-10-CM

## 2018-04-19 DIAGNOSIS — G4700 Insomnia, unspecified: Secondary | ICD-10-CM

## 2018-04-19 HISTORY — PX: ESOPHAGOGASTRODUODENOSCOPY (EGD) WITH PROPOFOL: SHX5813

## 2018-04-19 HISTORY — PX: FOREIGN BODY REMOVAL: SHX962

## 2018-04-19 SURGERY — ESOPHAGOGASTRODUODENOSCOPY (EGD) WITH PROPOFOL
Anesthesia: General

## 2018-04-19 MED ORDER — NICOTINE 21 MG/24HR TD PT24
21.0000 mg | MEDICATED_PATCH | Freq: Every day | TRANSDERMAL | Status: DC
  Administered 2018-04-19 – 2018-04-20 (×2): 21 mg via TRANSDERMAL
  Filled 2018-04-19 (×2): qty 1

## 2018-04-19 MED ORDER — FENTANYL CITRATE (PF) 100 MCG/2ML IJ SOLN
INTRAMUSCULAR | Status: DC | PRN
  Administered 2018-04-19: 100 ug via INTRAVENOUS

## 2018-04-19 MED ORDER — MIDAZOLAM HCL 2 MG/2ML IJ SOLN
INTRAMUSCULAR | Status: AC
  Filled 2018-04-19: qty 2

## 2018-04-19 MED ORDER — PROPOFOL 10 MG/ML IV BOLUS
INTRAVENOUS | Status: AC
  Filled 2018-04-19: qty 20

## 2018-04-19 MED ORDER — OXYCODONE HCL 5 MG PO TABS: 5.0000 mg | ORAL_TABLET | Freq: Once | ORAL | Status: DC | PRN

## 2018-04-19 MED ORDER — ONDANSETRON HCL 4 MG/2ML IJ SOLN
INTRAMUSCULAR | Status: DC | PRN
  Administered 2018-04-19: 4 mg via INTRAVENOUS

## 2018-04-19 MED ORDER — ROCURONIUM BROMIDE 10 MG/ML (PF) SYRINGE
PREFILLED_SYRINGE | INTRAVENOUS | Status: DC | PRN
  Administered 2018-04-19: 10 mg via INTRAVENOUS
  Administered 2018-04-19: 20 mg via INTRAVENOUS
  Administered 2018-04-19: 10 mg via INTRAVENOUS
  Administered 2018-04-19: 50 mg via INTRAVENOUS
  Administered 2018-04-19: 20 mg via INTRAVENOUS

## 2018-04-19 MED ORDER — LACTATED RINGERS IV SOLN
INTRAVENOUS | Status: DC | PRN
  Administered 2018-04-19 (×2): via INTRAVENOUS

## 2018-04-19 MED ORDER — PROPOFOL 10 MG/ML IV BOLUS
INTRAVENOUS | Status: DC | PRN
  Administered 2018-04-19: 150 mg via INTRAVENOUS

## 2018-04-19 MED ORDER — MIDAZOLAM HCL 5 MG/5ML IJ SOLN
INTRAMUSCULAR | Status: DC | PRN
  Administered 2018-04-19: 2 mg via INTRAVENOUS

## 2018-04-19 MED ORDER — LACTATED RINGERS IV SOLN: INTRAVENOUS | Status: DC

## 2018-04-19 MED ORDER — FENTANYL CITRATE (PF) 100 MCG/2ML IJ SOLN
INTRAMUSCULAR | Status: AC
  Filled 2018-04-19: qty 2

## 2018-04-19 MED ORDER — KETOROLAC TROMETHAMINE 30 MG/ML IJ SOLN
30.0000 mg | Freq: Once | INTRAMUSCULAR | Status: AC
  Administered 2018-04-19: 30 mg via INTRAMUSCULAR
  Filled 2018-04-19: qty 1

## 2018-04-19 MED ORDER — OXYCODONE-ACETAMINOPHEN 5-325 MG PO TABS
2.0000 | ORAL_TABLET | Freq: Once | ORAL | Status: AC
  Administered 2018-04-19: 2 via ORAL
  Filled 2018-04-19: qty 2

## 2018-04-19 MED ORDER — SODIUM CHLORIDE 0.9 % IV SOLN: INTRAVENOUS | Status: DC

## 2018-04-19 MED ORDER — FENTANYL CITRATE (PF) 100 MCG/2ML IJ SOLN
25.0000 ug | INTRAMUSCULAR | Status: DC | PRN
  Administered 2018-04-19 (×2): 25 ug via INTRAVENOUS

## 2018-04-19 MED ORDER — FENTANYL CITRATE (PF) 100 MCG/2ML IJ SOLN
INTRAMUSCULAR | Status: AC
  Administered 2018-04-19: 25 ug via INTRAVENOUS
  Filled 2018-04-19: qty 2

## 2018-04-19 MED ORDER — LIDOCAINE 2% (20 MG/ML) 5 ML SYRINGE
INTRAMUSCULAR | Status: DC | PRN
  Administered 2018-04-19: 100 mg via INTRAVENOUS

## 2018-04-19 MED ORDER — DEXAMETHASONE SODIUM PHOSPHATE 10 MG/ML IJ SOLN
INTRAMUSCULAR | Status: DC | PRN
  Administered 2018-04-19: 10 mg via INTRAVENOUS

## 2018-04-19 MED ORDER — PROMETHAZINE HCL 25 MG/ML IJ SOLN: 6.2500 mg | INTRAMUSCULAR | Status: DC | PRN

## 2018-04-19 MED ORDER — KETOROLAC TROMETHAMINE 30 MG/ML IJ SOLN: 30.0000 mg | Freq: Once | INTRAMUSCULAR | Status: DC

## 2018-04-19 MED ORDER — SUGAMMADEX SODIUM 200 MG/2ML IV SOLN
INTRAVENOUS | Status: DC | PRN
  Administered 2018-04-19: 250 mg via INTRAVENOUS

## 2018-04-19 MED ORDER — OXYCODONE HCL 5 MG/5ML PO SOLN: 5.0000 mg | Freq: Once | ORAL | Status: DC | PRN

## 2018-04-19 SURGICAL SUPPLY — 15 items

## 2018-04-19 NOTE — ED Provider Notes (Signed)
Patient currently on an IVC for intentional ingestion/self-harm.  He is just returned from endoscopy after removal of metal foreign bodies in his GI tract.  The patient is requesting medications for pain and to eat or drink something.  I reviewed the GI operative note that said advance diet as tolerated.  Clinical Course as of Apr 19 2108  Fri Apr 19, 2018  1800 Valuated the patient and return to the department.  He is complaining of some moderate throat pain from the intubation and the procedure.  He is asking to have some Percocet for that.  He also feels like he can advance his diet fairly quickly he would like to get a regular diet.  From my standing patient is medically cleared and can be returned back into the care of psychiatry.   [MB]    Clinical Course User Index [MB] Terrilee Files, MD      Terrilee Files, MD 04/19/18 2109

## 2018-04-19 NOTE — Consult Note (Addendum)
Subjective:   HPI  The patient is a 37 year old male with a history of substance abuse and also a history of ingesting foreign objects. We were consulted by the emergency department physician because an abdominal x-ray showed evidence of metallic foreign objects in his stomach. The patient tells me that he initially took a nail and ink cartridge and stuck them through an abdominal scar into his abdominal wall. Then while in the emergency room he states he had 4 pieces of sheet metal which he folded and swallowed them individually. They each took a while to go down his esophagus. The x-ray shows that they are in the stomach.  Patient tells me that he has had several abdominal surgeries for punctured intestines both from ingesting objects and pushing sharp objects through his scar.  Review of Systems No chest pain or shortness of breath  Past Medical History:  Diagnosis Date  . Cocaine abuse [facility])    Past Surgical History:  Procedure Laterality Date  . ENTEROSCOPY N/A 04/09/2018   Procedure: ENTEROSCOPY;  Surgeon: Mansouraty, Netty Starring., MD;  Location: [facility] ENDOSCOPY;  Service: Gastroenterology;  Laterality: N/A;   Social History   Socioeconomic History  . Marital status: Single    Spouse name: Not on file  . Number of children: Not on file  . Years of education: Not on file  . Highest education level: Not on file  Occupational History  . Not on file  Social Needs  . Financial resource strain: Not on file  . Food insecurity:    Worry: Not on file    Inability: Not on file  . Transportation needs:    Medical: Not on file    Non-medical: Not on file  Tobacco Use  . Smoking status: Current Some Day Smoker  . Smokeless tobacco: Never Used  Substance and Sexual Activity  . Alcohol use: Not Currently  . Drug use: Yes    Types: Cocaine  . Sexual activity: Not on file  Lifestyle  . Physical activity:    Days per week: Not on file    Minutes per session: Not on file  . Stress: Not  on file  Relationships  . Social connections:    Talks on phone: Not on file    Gets together: Not on file    Attends religious service: Not on file    Active member of club or organization: Not on file    Attends meetings of clubs or organizations: Not on file    Relationship status: Not on file  . Intimate partner violence:    Fear of current or ex partner: Not on file    Emotionally abused: Not on file    Physically abused: Not on file    Forced sexual activity: Not on file  Other Topics Concern  . Not on file  Social History Narrative  . Not on file   family history is not on file.  Current Facility-Administered Medications:  .  acetaminophen (TYLENOL) tablet 1,000 mg, 1,000 mg, Oral, Q6H PRN, Curatolo, Adam, DO .  buPROPion (WELLBUTRIN XL) 24 hr tablet 150 mg, 150 mg, Oral, Daily, Curatolo, Adam, DO, 150 mg at 04/18/18 2223 .  hydrOXYzine (ATARAX/VISTARIL) tablet 25 mg, 25 mg, Oral, TID PRN, Curatolo, Adam, DO .  LORazepam (ATIVAN) injection 0-4 mg, 0-4 mg, Intravenous, Q6H **OR** LORazepam (ATIVAN) tablet 0-4 mg, 0-4 mg, Oral, Q6H, Curatolo, Adam, DO, 1 mg at 04/18/18 2222 .  [START ON 04/21/2018] LORazepam (ATIVAN) injection 0-4 mg, 0-4 mg,  Intravenous, Q12H **OR** [START ON 04/21/2018] LORazepam (ATIVAN) tablet 0-4 mg, 0-4 mg, Oral, Q12H, Curatolo, Adam, DO .  mirtazapine (REMERON) tablet 7.5-15 mg, 7.5-15 mg, Oral, QHS PRN, Curatolo, Adam, DO, 15 mg at 04/18/18 2230 .  nicotine (NICODERM CQ - dosed in mg/24 hours) patch 21 mg, 21 mg, Transdermal, Once, Curatolo, Adam, DO, 21 mg at 04/18/18 2221 .  pantoprazole (PROTONIX) EC tablet 20 mg, 20 mg, Oral, Daily, Curatolo, Adam, DO, 20 mg at 04/18/18 2223 .  thiamine (VITAMIN B-1) tablet 100 mg, 100 mg, Oral, Daily, 100 mg at 04/18/18 2222 **OR** thiamine (B-1) injection 100 mg, 100 mg, Intravenous, Daily, Curatolo, Adam, DO  Current Outpatient Medications:  .  buPROPion (WELLBUTRIN XL) 150 MG 24 hr tablet, Take 150 mg by mouth  daily., Disp: , Rfl:  .  hydrOXYzine (ATARAX/VISTARIL) 25 MG tablet, Take 25 mg by mouth 3 (three) times daily as needed for anxiety., Disp: , Rfl:  .  mirtazapine (REMERON) 15 MG tablet, Take 7.5-15 mg by mouth See admin instructions. Taking 1/2 (7.5 mg) to one tablet (15mg ) as needed at bedtime for sleep, Disp: , Rfl:  .  pantoprazole (PROTONIX) 20 MG tablet, Take 1 tablet (20 mg total) by mouth daily., Disp: 30 tablet, Rfl: 0 .  acetaminophen (TYLENOL) 500 MG tablet, Take 1,000 mg by mouth every 6 (six) hours as needed for mild pain., Disp: , Rfl:  .  cyanocobalamin (,VITAMIN B-12,) 1000 MCG/ML injection, Inject 1,000 mcg into the muscle every 30 (thirty) days., Disp: , Rfl:  Allergies  Allergen Reactions  . Tegretol [Carbamazepine] Rash and Other (See Comments)    Blisters in mouth     Objective:     BP 103/76 (BP Location: Right Arm)   Pulse 70   Temp 98 F (36.7 C) (Oral)   Resp 16   SpO2 100%   No acute distress  Nonicteric  Heart regular rhythm no murmurs  Lungs clear  Abdomen Abdominal scar. There is a small puncture area on this scar where he states he stopped these objects in.  Laboratory No components found for: D1    Assessment:     Foreign bodies in the stomach. These appeared to be metallic and person history are full good pieces of sheet metal.      Plan:     We will attempt endoscopic removal. This was discussed with the patient. He understands the potential risks of bleeding, and perforation of the stomach or esophagus or tear of the back of throat. We will proceed with this is afternoon. Removal of these gastric foreign objects is unsuccessful we will discuss further surgery. I have discussed with surgery already and they agree the first attempt at removal should be endoscopic.

## 2018-04-19 NOTE — Consult Note (Signed)
[name] Consult   Reason for Consult:  Suicide attempt  Referring Physician:  EDP Patient Identification: David Mcconnell MRN:  [id number] Principal Diagnosis: Suicide attempt [facility]) Diagnosis:   Patient Active Problem List   Diagnosis Date Noted  . Foreign body in digestive system [T18.9XXA]   . Major depressive disorder, recurrent severe without psychotic features ([place]) [F33.2] 04/01/2018  . Serotonin syndrome [G25.79] 03/31/2018  . Intentional drug overdose ([place]) [T50.902A]     Total Time spent with patient: 30 minutes  Subjective:   [name] is a 37 y.o. male patient admitted with suicide attempt after ingesting foreign bodies.  HPI:   Per chart review, patient initially presented to the hospital after inserting a pencil and nail into an old laparotomy scar to harm himself. He later ingested multiple foreign bodies to harm himself after he was admitted to the hospital. He reportedly swallowed jagged pieces of sheet metal.They were reportedly inside his socks. DG Abdomen is remarkable for newly ingested foreign object in the left upper abdomen. CT abdomen is remarkable for 5 metallic foreign bodies over the midline anterior abdominal wall just above the umbilicus.   On interview, Mr. Mcconnell reports that he "needs to get right." He endorses depressed mood in the setting of multiple psychosocial stressors. He has been unable to work due to inclement weather. He reports financial stressors. He was previously living with his fiance. He plans to live with his parents after discharge from the hospital. He reports poor medication compliance due to being unable to afford his medications. He denies SI, HI or AVH at this time. He reports ingesting foreign bodies to "force them to do it" when referring to surgery to remove prior foreign body due to it causing abdominal pain.   Past Psychiatric History: Depression and polysubstance abuse.   Risk to Self: Suicidal  Ideation: No(Pt denies. ) Suicidal Intent: No Is patient at risk for suicide?: No Suicidal Plan?: No Access to Means: No What has been your use of drugs/alcohol within the last 12 months?: Cocaine, alcohol and cigarettes. How many times?: 1 Other Self Harm Risks: Swallowing four metal pieces, inserting ink cartridge from pen in his abdomen.  Triggers for Past Attempts: Unknown Intentional Self Injurious Behavior: Damaging Comment - Self Injurious Behavior: Swallowing four metal pieces, inserting ink cartridge from pen in his abdomen.  Risk to Others: Homicidal Ideation: No(Pt denies. ) Thoughts of Harm to Others: No Current Homicidal Intent: No Current Homicidal Plan: No Access to Homicidal Means: No Identified Victim: NA History of harm to others?: No Assessment of Violence: None Noted Violent Behavior Description: NA Does patient have access to weapons?: No(Pt denies. ) Criminal Charges Pending?: No Does patient have a court date: No Prior Inpatient Therapy:  He was hospitalized at [facility] in 2011 for suicide attempt by Unasyn overdose and cutting his wrist in the setting of legal charges.  Prior Outpatient Therapy: Prior Outpatient Therapy: Yes Prior Therapy Dates: Current Prior Therapy Facilty/Provider(s): [facility].  Reason for Treatment: Medication management. Does patient have an ACCT team?: No Does patient have Intensive In-House Services?  : No Does patient have [facility] services? : Yes Does patient have P4CC services?: No  Past Medical History:  Past Medical History:  Diagnosis Date  . Cocaine abuse [facility])     Past Surgical History:  Procedure Laterality Date  . ENTEROSCOPY N/A 04/09/2018   Procedure: ENTEROSCOPY;  Surgeon: Mansouraty, Telford Nab., MD;  Location: [place];  Service: Gastroenterology;  Laterality: N/A;  Family History: No family history on file. Family Psychiatric  History: None  Social History:  Social History   Substance and Sexual Activity   Alcohol Use Not Currently     Social History   Substance and Sexual Activity  Drug Use Yes  . Types: Cocaine    Social History   Socioeconomic History  . Marital status: Single    Spouse name: Not on file  . Number of children: Not on file  . Years of education: Not on file  . Highest education level: Not on file  Occupational History  . Not on file  Social Needs  . Financial resource strain: Not on file  . Food insecurity:    Worry: Not on file    Inability: Not on file  . Transportation needs:    Medical: Not on file    Non-medical: Not on file  Tobacco Use  . Smoking status: Current Some Day Smoker  . Smokeless tobacco: Never Used  Substance and Sexual Activity  . Alcohol use: Not Currently  . Drug use: Yes    Types: Cocaine  . Sexual activity: Not on file  Lifestyle  . Physical activity:    Days per week: Not on file    Minutes per session: Not on file  . Stress: Not on file  Relationships  . Social connections:    Talks on phone: Not on file    Gets together: Not on file    Attends religious service: Not on file    Active member of club or organization: Not on file    Attends meetings of clubs or organizations: Not on file    Relationship status: Not on file  Other Topics Concern  . Not on file  Social History Narrative  . Not on file   Additional Social History: He was previously living with his fiance. He plans to live with his parents on discharge. He has a history of polysubstance abuse. UDS was positive for cocaine. He reports last using cocaine 2 days ago.     Allergies:   Allergies  Allergen Reactions  . Tegretol [Carbamazepine] Rash and Other (See Comments)    Blisters in mouth    Labs:  Results for orders placed or performed during the hospital encounter of 04/18/18 (from the past 48 hour(s))  Ethanol     Status: Abnormal   Collection Time: 04/18/18  5:59 PM  Result Value Ref Range   Alcohol, Ethyl (B) 12 (H) <10 mg/dL    Comment:  (NOTE) Lowest detectable limit for serum alcohol is 10 mg/dL. For medical purposes only. Performed at [facility], [place] [address] [place], [place] [address]   Urine rapid drug screen (hosp performed)     Status: Abnormal   Collection Time: 04/18/18  5:59 PM  Result Value Ref Range   Opiates NONE DETECTED NONE DETECTED   Cocaine POSITIVE (A) NONE DETECTED   Benzodiazepines NONE DETECTED NONE DETECTED   Amphetamines NONE DETECTED NONE DETECTED   Tetrahydrocannabinol NONE DETECTED NONE DETECTED   Barbiturates NONE DETECTED NONE DETECTED    Comment: (NOTE) DRUG SCREEN FOR MEDICAL PURPOSES ONLY.  IF CONFIRMATION IS NEEDED FOR ANY PURPOSE, NOTIFY LAB WITHIN 5 DAYS. LOWEST DETECTABLE LIMITS FOR URINE DRUG SCREEN Drug Class                     Cutoff (ng/mL) Amphetamine and metabolites    1000 Barbiturate and metabolites    200 Benzodiazepine  536 Tricyclics and metabolites     300 Opiates and metabolites        300 Cocaine and metabolites        300 THC                            50 Performed at [facility], [place] [address], [place], [place] [address]   Acetaminophen level     Status: Abnormal   Collection Time: 04/18/18  5:59 PM  Result Value Ref Range   Acetaminophen (Tylenol), Serum <10 (L) 10 - 30 ug/mL    Comment: (NOTE) Therapeutic concentrations vary significantly. A range of 10-30 ug/mL  may be an effective concentration for many patients. However, some  are best treated at concentrations outside of this range. Acetaminophen concentrations >150 ug/mL at 4 hours after ingestion  and >50 ug/mL at 12 hours after ingestion are often associated with  toxic reactions. Performed at [facility], [place] [address], [place], [place] [address]   Salicylate level     Status: None   Collection Time: 04/18/18  5:59 PM  Result Value Ref Range   Salicylate Lvl <6.7 2.8 - 30.0 mg/dL    Comment: Performed at  [facility], [place] [address] [place], [place] [address]  CBC with Differential     Status: Abnormal   Collection Time: 04/18/18  6:06 PM  Result Value Ref Range   WBC 11.3 (H) 4.0 - 10.5 K/uL   RBC 5.12 4.22 - 5.81 MIL/uL   Hemoglobin 14.1 13.0 - 17.0 g/dL   HCT 44.8 39.0 - 52.0 %   MCV 87.5 80.0 - 100.0 fL   MCH 27.5 26.0 - 34.0 pg   MCHC 31.5 30.0 - 36.0 g/dL   RDW 13.2 11.5 - 15.5 %   Platelets 240 150 - 400 K/uL   nRBC 0.0 0.0 - 0.2 %   Neutrophils Relative % 83 %   Neutro Abs 9.3 (H) 1.7 - 7.7 K/uL   Lymphocytes Relative 11 %   Lymphs Abs 1.2 0.7 - 4.0 K/uL   Monocytes Relative 5 %   Monocytes Absolute 0.6 0.1 - 1.0 K/uL   Eosinophils Relative 1 %   Eosinophils Absolute 0.1 0.0 - 0.5 K/uL   Basophils Relative 0 %   Basophils Absolute 0.0 0.0 - 0.1 K/uL   Immature Granulocytes 0 %   Abs Immature Granulocytes 0.05 0.00 - 0.07 K/uL    Comment: Performed at [facility], [place] [address] [place], [place] [address]  Comprehensive metabolic panel     Status: None   Collection Time: 04/18/18  6:06 PM  Result Value Ref Range   Sodium 139 135 - 145 mmol/L   Potassium 3.6 3.5 - 5.1 mmol/L   Chloride 104 98 - 111 mmol/L   CO2 22 22 - 32 mmol/L   Glucose, Bld 95 70 - 99 mg/dL   BUN 11 6 - 20 mg/dL   Creatinine, Ser 1.18 0.61 - 1.24 mg/dL   Calcium 9.4 8.9 - 10.3 mg/dL   Total Protein 7.4 6.5 - 8.1 g/dL   Albumin 4.6 3.5 - 5.0 g/dL   AST 21 15 - 41 U/L   ALT 37 0 - 44 U/L   Alkaline Phosphatase 67 38 - 126 U/L   Total Bilirubin 0.6 0.3 - 1.2 mg/dL   GFR calc non Af Amer >60 >60 mL/min   GFR calc Af Amer >  60 >60 mL/min    Comment: (NOTE) The eGFR has been calculated using the CKD EPI equation. This calculation has not been validated in all clinical situations. eGFR's persistently <60 mL/min signify possible Chronic Kidney Disease.    Anion gap 13 5 - 15    Comment: Performed at [facility], [place] [address]  [place], [place] [address]    Current Facility-Administered Medications  Medication Dose Route Frequency Provider Last Rate Last Dose  . acetaminophen (TYLENOL) tablet 1,000 mg  1,000 mg Oral Q6H PRN Curatolo, Adam, DO      . buPROPion (WELLBUTRIN XL) 24 hr tablet 150 mg  150 mg Oral Daily Curatolo, Adam, DO   150 mg at 04/18/18 2223  . hydrOXYzine (ATARAX/VISTARIL) tablet 25 mg  25 mg Oral TID PRN Lennice Sites, DO      . LORazepam (ATIVAN) injection 0-4 mg  0-4 mg Intravenous Q6H Curatolo, Adam, DO       Or  . LORazepam (ATIVAN) tablet 0-4 mg  0-4 mg Oral Q6H Curatolo, Adam, DO   1 mg at 04/18/18 2222  . [START ON 04/21/2018] LORazepam (ATIVAN) injection 0-4 mg  0-4 mg Intravenous Q12H Curatolo, Adam, DO       Or  . Derrill Memo ON 04/21/2018] LORazepam (ATIVAN) tablet 0-4 mg  0-4 mg Oral Q12H Curatolo, Adam, DO      . mirtazapine (REMERON) tablet 7.5-15 mg  7.5-15 mg Oral QHS PRN Curatolo, Adam, DO   15 mg at 04/18/18 2230  . nicotine (NICODERM CQ - dosed in mg/24 hours) patch 21 mg  21 mg Transdermal Once Curatolo, Adam, DO   21 mg at 04/18/18 2221  . pantoprazole (PROTONIX) EC tablet 20 mg  20 mg Oral Daily Curatolo, Adam, DO   20 mg at 04/18/18 2223  . thiamine (VITAMIN B-1) tablet 100 mg  100 mg Oral Daily Curatolo, Adam, DO   100 mg at 04/18/18 2222   Or  . thiamine (B-1) injection 100 mg  100 mg Intravenous Daily Curatolo, Adam, DO       Current Outpatient Medications  Medication Sig Dispense Refill  . buPROPion (WELLBUTRIN XL) 150 MG 24 hr tablet Take 150 mg by mouth daily.    . hydrOXYzine (ATARAX/VISTARIL) 25 MG tablet Take 25 mg by mouth 3 (three) times daily as needed for anxiety.    . mirtazapine (REMERON) 15 MG tablet Take 7.5-15 mg by mouth See admin instructions. Taking 1/2 (7.5 mg) to one tablet ('15mg'$ ) as needed at bedtime for sleep    . pantoprazole (PROTONIX) 20 MG tablet Take 1 tablet (20 mg total) by mouth daily. 30 tablet 0  . acetaminophen (TYLENOL) 500 MG tablet Take 1,000 mg  by mouth every 6 (six) hours as needed for mild pain.    . cyanocobalamin (,VITAMIN B-12,) 1000 MCG/ML injection Inject 1,000 mcg into the muscle every 30 (thirty) days.      Musculoskeletal: Strength & Muscle Tone: within normal limits Gait & Station: normal Patient leans: N/A  Psychiatric Specialty Exam: Physical Exam  Nursing note and vitals reviewed. Constitutional: He is oriented to person, place, and time. He appears well-developed and well-nourished.  HENT:  Head: Normocephalic and atraumatic.  Neck: Normal range of motion.  Respiratory: Effort normal.  Musculoskeletal: Normal range of motion.  Neurological: He is alert and oriented to person, place, and time.  Psychiatric: His speech is normal and behavior is normal. Judgment and thought content normal. Cognition and memory are normal. He exhibits  a depressed mood.    Review of Systems  Gastrointestinal: Positive for abdominal pain.  Psychiatric/Behavioral: Positive for depression, substance abuse and suicidal ideas. Negative for hallucinations.  All other systems reviewed and are negative.   Blood pressure 103/76, pulse 70, temperature 98 F (36.7 C), temperature source Oral, resp. rate 16, SpO2 100 %.There is no height or weight on file to calculate BMI.  General Appearance: Fairly Groomed, young, African American male, wearing paper hospital scrubs with multiple tattoos who is sitting in bed. NAD.   Eye Contact:  Good  Speech:  Clear and Coherent and Normal Rate  Volume:  Normal  Mood:  Depressed  Affect:  Constricted  Thought Process:  Goal Directed, Linear and Descriptions of Associations: Intact  Orientation:  Full (Time, Place, and Person)  Thought Content:  Logical  Suicidal Thoughts:  Yes.  with intent/plan  Homicidal Thoughts:  No  Memory:  Immediate;   Good Recent;   Good Remote;   Good  Judgement:  Poor  Insight:  Fair  Psychomotor Activity:  Normal  Concentration:  Concentration: Good and Attention  Span: Good  Recall:  Good  Fund of Knowledge:  Good  Language:  Good  Akathisia:  No  Handed:  Right  AIMS (if indicated):   N/A  Assets:  Communication Skills Desire for Improvement Financial Resources/Insurance Housing Physical Health Social Support  ADL's:  Intact  Cognition:  WNL  Sleep:   N/A   Assessment:  EXODUS KUTZER is a 37 y.o. male who was admitted with intentional self harm by stabbing himself with a nail and pen in his abdomen then ingesting multiple foreign bodies while admitted to the hospital. He endorses depression in the setting of multiple psychosocial stressors. He was admitted to the hospital (10/12-10/25) for intentional overdose and was later psychiatrically cleared due to patient being deemed safe after acute crisis passed. He has a history of intentional foreign body ingestion and other impulsive behaviors. He is a high risk of harming self with ongoing stressors and depression and warrants inpatient psychiatric hospitalization once medically cleared.   Treatment Plan Summary: -Patient is not medically cleared at this time. Spoke to GI who will defer to surgery for exploration of foreign bodies due to risk for perforation with removal. Patient will need an inpatient psychiatric admission once medically cleared given high risk of harm to self.  -Patient is NPO at this time. Recommend restarting home medications (Wellbutrin XL 150 mg daily for depression, Remeron 7.5-15 mg qhs PRN for insomnia and Atarax 25 mg TID PRN for anxiety) once patient is able to tolerate PO medications. -EKG reviewed and QTc 395 on 10/12. Please closely monitor when starting or increasing QTc prolonging agents.     Disposition: Recommend psychiatric Inpatient admission when medically cleared.  Faythe Dingwall, DO 04/19/2018 11:15 AM

## 2018-04-19 NOTE — ED Notes (Signed)
Patient given sandwich, apple sauce, and macaroni and cheese.

## 2018-04-19 NOTE — ED Notes (Signed)
Consult to [facility]@02 :20am.

## 2018-04-19 NOTE — ED Notes (Signed)
Bed: [facility] Expected date:  Expected time:  Means of arrival:  Comments: 16

## 2018-04-19 NOTE — ED Notes (Signed)
Pt remains NPO, saline lock/rt hand in place. Pt resting comfortably at present. No distress noted, resp.even and unlabored, skin color good.

## 2018-04-19 NOTE — ED Notes (Signed)
Patient presents with anxious affect and mood.  Denies suicidal thoughts, auditory and visual hallucinations.  Patient placed on NPO status pending endoscopy procedure. Patient seen by Dr. Evette Cristal.  Routine safety checks maintained every 15 minutes and via security camera.  Patient transferred to [facility] pending medical clearance and procedure.  Patient is safe on the unit.  No issues reported or noted.  Verbalizes understanding of transfer.

## 2018-04-19 NOTE — ED Notes (Signed)
Pt tolerated meal well,  A&O x 3, no distress noted, calm & cooperative at present.  Monitoring for safety, Q 15 min checks in effect.

## 2018-04-19 NOTE — ED Notes (Signed)
MD Charm Barges made aware patient is requesting pain medication and food.

## 2018-04-19 NOTE — ED Notes (Signed)
EDP at bedside  

## 2018-04-19 NOTE — H&P (Signed)
Patient in endo dept now for egd with attempted removal of gastric foreign bodies. No change in history and physical from a couple hours ago. Plan EGD with FB removal.

## 2018-04-19 NOTE — Anesthesia Preprocedure Evaluation (Addendum)
Anesthesia Evaluation  Patient identified by MRN, date of birth, ID band Patient awake    Reviewed: Allergy & Precautions, NPO status , Patient's Chart, lab work & pertinent test results  History of Anesthesia Complications Negative for: history of anesthetic complications  Airway Mallampati: II  TM Distance: >3 FB Neck ROM: Full    Dental  (+) Dental Advisory Given, Teeth Intact   Pulmonary Current Smoker,    breath sounds clear to auscultation       Cardiovascular negative cardio ROS   Rhythm:Regular Rate:Normal     Neuro/Psych PSYCHIATRIC DISORDERS Depression negative neurological ROS     GI/Hepatic GERD  Medicated,(+)     substance abuse  cocaine use,   Endo/Other  negative endocrine ROS  Renal/GU negative Renal ROS     Musculoskeletal negative musculoskeletal ROS (+)   Abdominal   Peds  Hematology negative hematology ROS (+)   Anesthesia Other Findings Hx serotonin syndrome  Reproductive/Obstetrics                            Anesthesia Physical Anesthesia Plan  ASA: III  Anesthesia Plan: General   Post-op Pain Management:    Induction: Intravenous and Rapid sequence  PONV Risk Score and Plan: 2 and Treatment may vary due to age or medical condition, Ondansetron and Midazolam  Airway Management Planned: Oral ETT  Additional Equipment: None  Intra-op Plan:   Post-operative Plan: Extubation in OR  Informed Consent: I have reviewed the patients History and Physical, chart, labs and discussed the procedure including the risks, benefits and alternatives for the proposed anesthesia with the patient or authorized representative who has indicated his/her understanding and acceptance.   Dental advisory given  Plan Discussed with: CRNA and Anesthesiologist  Anesthesia Plan Comments:        Anesthesia Quick Evaluation

## 2018-04-19 NOTE — Anesthesia Procedure Notes (Signed)
Procedure Name: Intubation Date/Time: 04/19/2018 1:47 PM Performed by: Gracynn Rajewski D, CRNA Pre-anesthesia Checklist: Patient identified, Emergency Drugs available, Suction available and Patient being monitored Patient Re-evaluated:Patient Re-evaluated prior to induction Oxygen Delivery Method: Circle system utilized Preoxygenation: Pre-oxygenation with 100% oxygen Induction Type: IV induction Ventilation: Mask ventilation without difficulty Laryngoscope Size: Mac and 4 Grade View: Grade I Tube type: Oral Tube size: 7.5 mm Number of attempts: 1 Airway Equipment and Method: Stylet and Oral airway Placement Confirmation: ETT inserted through vocal cords under direct vision,  positive ETCO2 and breath sounds checked- equal and bilateral Secured at: 22 cm Tube secured with: Tape Dental Injury: Teeth and Oropharynx as per pre-operative assessment

## 2018-04-19 NOTE — ED Notes (Signed)
Bed: WA16 Expected date:  Expected time:  Means of arrival:  Comments: Endo  

## 2018-04-19 NOTE — Transfer of Care (Signed)
Immediate Anesthesia Transfer of Care Note  Patient: David Mcconnell  Procedure(s) Performed: ESOPHAGOGASTRODUODENOSCOPY (EGD) WITH PROPOFOL (N/A ) FOREIGN BODY REMOVAL  Patient Location: PACU  Anesthesia Type:General  Level of Consciousness: awake and alert   Airway & Oxygen Therapy: Patient Spontanous Breathing and Patient connected to face mask oxygen  Post-op Assessment: Report given to RN and Post -op Vital signs reviewed and stable  Post vital signs: Reviewed and stable  Last Vitals:  Vitals Value Taken Time  BP    Temp    Pulse 70 04/19/2018  4:44 PM  Resp 16 04/19/2018  4:44 PM  SpO2 100 % 04/19/2018  4:44 PM  Vitals shown include unvalidated device data.  Last Pain:  Vitals:   04/19/18 1320  TempSrc: Oral  PainSc: 0-No pain         Complications: No apparent anesthesia complications

## 2018-04-19 NOTE — Op Note (Addendum)
[facility] Patient Name: David Mcconnell Procedure Date: 04/19/2018 MRN: [id number] Attending MD: Graylin Shiver , MD Date of Birth: 07-24-1980 CSN: [id number] Age: 37 Admit Type: Inpatient Procedure:                Upper GI endoscopy Indications:              Foreign body in the stomach. Patient swallowed                            metal (says it was folded sheet metal strips) Providers:                Graylin Shiver, MD, Tillie Fantasia, RN, Zoila Shutter, Technician, [facility], CRNA Referring MD:              Medicines:                General Anesthesia Complications:            No immediate complications. Estimated Blood Loss:     Estimated blood loss was minimal. Procedure:                Pre-Anesthesia Assessment:                           - Prior to the procedure, a History and Physical                            was performed, and patient medications and                            allergies were reviewed. The patient's tolerance of                            previous anesthesia was also reviewed. The risks                            and benefits of the procedure and the sedation                            options and risks were discussed with the patient.                            All questions were answered, and informed consent                            was obtained. Prior Anticoagulants: The patient has                            taken no previous anticoagulant or antiplatelet                            agents. ASA Grade Assessment: II - A patient with  mild systemic disease. After reviewing the risks                            and benefits, the patient was deemed in                            satisfactory condition to undergo the procedure.                           After obtaining informed consent, the endoscope was                            passed under direct vision over an overtube..                      Throughout the procedure, the patient's blood                            pressure, pulse, and oxygen saturations were                            monitored continuously. The GIF-H190 ([id number])                            Olympus Adult Endoscope was introduced through the                            mouth, and advanced to the second part of duodenum.                            The upper GI endoscopy was performed with                            difficulty. The patient tolerated the procedure                            well. Scope In: Scope Out: Findings:      No gross lesions were noted in the entire esophagus.      6 long folded metal objects were found in the gastric body there was       also a ball of string.. Removal was accomplished with a Rat tooth       forceps and Roth net(for the ball of string). Initially I passed the       scope over an overtube and inspected the stomach. the metal objects were       oriented with the blunt side distally. This was how he swallowed them. I       reoriented the metal so the blunt side would face upward to remove up       the esophagus. Initially I tried to get the metal objects into the       overtube but they were to wide and would not fit. The overtube was then       removed and each metal object was grasped with the rat tooth forcep and       brought out. A few lodged in the throat and I  had to grab them with       hemostats to pull out of his mouth.      The in the duodenum was normal. Impression:               - No gross lesions in esophagus. Initially, but                            there was irritation at the end of the procedure.                           - 6 long folded metal objects were found in the                            stomach and a ball of string. Removal was                            successful. Moderate Sedation:      . Recommendation:           - Advance diet as tolerated.                           -  Continue present medications.                           - Return to ED after he recovers in PACU                           - SUSPECT HE WILL HAVE A SORE THROAT Procedure Code(s):        --- Professional ---                           [phone], Esophagogastroduodenoscopy, flexible,                            transoral; with removal of foreign body(s) Diagnosis Code(s):        --- Professional ---                           U04.5WUJ, Foreign body in stomach, initial encounter CPT copyright 2018 [employer]. All rights reserved. The codes documented in this report are preliminary and upon coder review may  be revised to meet current compliance requirements. Graylin Shiver, MD 04/19/2018 4:54:23 PM This report has been signed electronically. Number of Addenda: 0

## 2018-04-19 NOTE — ED Provider Notes (Addendum)
12:30 AM  Assumed care from Dr. Lockie Mola.  Patient is a 37 year old male with psychiatric history who presented to the emergency department after he pushed several foreign bodies into his ex lap scar today.  Initial imaging showed metallic foreign bodies in the subcutaneous tissues.  Surgery was consulted and recommended outpatient follow-up.  He had no peritoneal signs and no free air on CT imaging.  While in the psychiatric holding area, patient swallowed for metal foreign bodies.  Repeat x-ray shows metallic foreign bodies in his stomach.  He has had previous endoscopy by Dr. Meridee Score.  Will discuss with GI on-call.  2:16 AM  D/w Dr. Myrtie Neither with [facility].  Because patient has never been seen in the office, he states patient would be unassigned.  Will contact [facility].  2:30 AM  D/w Dr. Evette Cristal with [facility].  They will plan to scope patient in the AM.  Will keep him NPO.  He will need PIV for the AM.  Nurse Latricia is aware.     After endoscopy, patient will come back to ED where he will await psychiatric placement.  I reviewed all nursing notes, vitals, pertinent previous records, EKGs, lab and urine results, imaging (as available).      Raphaela Cannaday, Layla Maw, DO 04/19/18 0234    Katlynn Naser, Layla Maw, DO 04/19/18 [phone]

## 2018-04-19 NOTE — ED Notes (Signed)
Patient wanded by security. 

## 2018-04-20 ENCOUNTER — Emergency Department (HOSPITAL_COMMUNITY): Payer: Self-pay

## 2018-04-20 ENCOUNTER — Emergency Department (HOSPITAL_COMMUNITY)
Admission: EM | Disposition: A | Discharge status: Home / Self Care | Payer: Self-pay | Source: Home / Self Care | Attending: Emergency Medicine

## 2018-04-20 ENCOUNTER — Encounter (HOSPITAL_COMMUNITY): Payer: Self-pay | Admitting: *Deleted

## 2018-04-20 ENCOUNTER — Emergency Department (HOSPITAL_COMMUNITY): Payer: Self-pay | Admitting: Certified Registered"

## 2018-04-20 DIAGNOSIS — F419 Anxiety disorder, unspecified: Secondary | ICD-10-CM

## 2018-04-20 DIAGNOSIS — F1410 Cocaine abuse, uncomplicated: Secondary | ICD-10-CM | POA: Diagnosis present

## 2018-04-20 DIAGNOSIS — K2970 Gastritis, unspecified, without bleeding: Secondary | ICD-10-CM

## 2018-04-20 DIAGNOSIS — T189XXA Foreign body of alimentary tract, part unspecified, initial encounter: Secondary | ICD-10-CM

## 2018-04-20 HISTORY — PX: FOREIGN BODY REMOVAL: SHX962

## 2018-04-20 HISTORY — PX: ESOPHAGOGASTRODUODENOSCOPY (EGD) WITH PROPOFOL: SHX5813

## 2018-04-20 SURGERY — ESOPHAGOGASTRODUODENOSCOPY (EGD) WITH PROPOFOL
Anesthesia: General

## 2018-04-20 MED ORDER — DEXAMETHASONE SODIUM PHOSPHATE 10 MG/ML IJ SOLN
INTRAMUSCULAR | Status: DC | PRN
  Administered 2018-04-20: 10 mg via INTRAVENOUS

## 2018-04-20 MED ORDER — SUCCINYLCHOLINE CHLORIDE 200 MG/10ML IV SOSY
PREFILLED_SYRINGE | INTRAVENOUS | Status: DC | PRN
  Administered 2018-04-20: 200 mg via INTRAVENOUS

## 2018-04-20 MED ORDER — SODIUM CHLORIDE 0.9 % IV SOLN
INTRAVENOUS | Status: DC | PRN
  Administered 2018-04-20: 25 ug/min via INTRAVENOUS

## 2018-04-20 MED ORDER — SUGAMMADEX SODIUM 500 MG/5ML IV SOLN
INTRAVENOUS | Status: DC | PRN
  Administered 2018-04-20: 500 mg via INTRAVENOUS

## 2018-04-20 MED ORDER — GABAPENTIN 300 MG PO CAPS
300.0000 mg | ORAL_CAPSULE | Freq: Three times a day (TID) | ORAL | Status: DC
  Administered 2018-04-20 – 2018-04-21 (×2): 300 mg via ORAL
  Filled 2018-04-20 (×2): qty 1

## 2018-04-20 MED ORDER — FENTANYL CITRATE (PF) 100 MCG/2ML IJ SOLN
INTRAMUSCULAR | Status: AC
  Filled 2018-04-20: qty 2

## 2018-04-20 MED ORDER — LIDOCAINE 2% (20 MG/ML) 5 ML SYRINGE
INTRAMUSCULAR | Status: DC | PRN
  Administered 2018-04-20: 100 mg via INTRAVENOUS

## 2018-04-20 MED ORDER — MENTHOL 3 MG MT LOZG
1.0000 | LOZENGE | OROMUCOSAL | Status: DC | PRN
  Administered 2018-04-20: 3 mg via ORAL
  Filled 2018-04-20: qty 9

## 2018-04-20 MED ORDER — ONDANSETRON HCL 4 MG/2ML IJ SOLN
INTRAMUSCULAR | Status: DC | PRN
  Administered 2018-04-20: 4 mg via INTRAVENOUS

## 2018-04-20 MED ORDER — IBUPROFEN 800 MG PO TABS
800.0000 mg | ORAL_TABLET | Freq: Four times a day (QID) | ORAL | Status: DC | PRN
  Administered 2018-04-20: 800 mg via ORAL
  Filled 2018-04-20: qty 1

## 2018-04-20 MED ORDER — PHENYLEPHRINE 40 MCG/ML (10ML) SYRINGE FOR IV PUSH (FOR BLOOD PRESSURE SUPPORT)
PREFILLED_SYRINGE | INTRAVENOUS | Status: DC | PRN
  Administered 2018-04-20: 120 ug via INTRAVENOUS
  Administered 2018-04-20: 160 ug via INTRAVENOUS

## 2018-04-20 MED ORDER — MIDAZOLAM HCL 2 MG/2ML IJ SOLN
INTRAMUSCULAR | Status: AC
  Filled 2018-04-20: qty 2

## 2018-04-20 MED ORDER — ROCURONIUM BROMIDE 10 MG/ML (PF) SYRINGE
PREFILLED_SYRINGE | INTRAVENOUS | Status: DC | PRN
  Administered 2018-04-20 (×2): 30 mg via INTRAVENOUS

## 2018-04-20 MED ORDER — PROPOFOL 10 MG/ML IV BOLUS
INTRAVENOUS | Status: DC | PRN
  Administered 2018-04-20: 200 mg via INTRAVENOUS

## 2018-04-20 MED ORDER — LACTATED RINGERS IV SOLN
INTRAVENOUS | Status: DC | PRN
  Administered 2018-04-20 (×2): via INTRAVENOUS

## 2018-04-20 MED ORDER — PROPOFOL 10 MG/ML IV BOLUS
INTRAVENOUS | Status: AC
  Filled 2018-04-20: qty 20

## 2018-04-20 MED ORDER — EPHEDRINE SULFATE-NACL 50-0.9 MG/10ML-% IV SOSY
PREFILLED_SYRINGE | INTRAVENOUS | Status: DC | PRN
  Administered 2018-04-20: 10 mg via INTRAVENOUS

## 2018-04-20 MED ORDER — MIRTAZAPINE 7.5 MG PO TABS
7.5000 mg | ORAL_TABLET | Freq: Every evening | ORAL | Status: DC | PRN
  Administered 2018-04-20: 7.5 mg via ORAL
  Filled 2018-04-20 (×2): qty 1

## 2018-04-20 SURGICAL SUPPLY — 15 items

## 2018-04-20 NOTE — Progress Notes (Addendum)
This patient continues to meet inpatient criteria. CSW fax information to the following facilities:   [facility]  [facility]  Enid Cutter, [place] Clinical Social Worker [phone]

## 2018-04-20 NOTE — Transfer of Care (Signed)
Immediate Anesthesia Transfer of Care Note  Patient: David Mcconnell  Procedure(s) Performed: ESOPHAGOGASTRODUODENOSCOPY (EGD) WITH PROPOFOL (N/A ) FOREIGN BODY REMOVAL  Patient Location: PACU  Anesthesia Type:General  Level of Consciousness: awake, alert , oriented and patient cooperative  Airway & Oxygen Therapy: Patient Spontanous Breathing and Patient connected to face mask oxygen  Post-op Assessment: Report given to RN, Post -op Vital signs reviewed and stable and Patient moving all extremities X 4  Post vital signs: stable  Last Vitals:  Vitals Value Taken Time  BP 129/80 04/20/2018  7:55 PM  Temp    Pulse 86 04/20/2018  7:58 PM  Resp 11 04/20/2018  7:58 PM  SpO2 100 % 04/20/2018  7:58 PM  Vitals shown include unvalidated device data.  Last Pain:  Vitals:   04/20/18 1704  TempSrc: Oral  PainSc: 0-No pain         Complications: No apparent anesthesia complications

## 2018-04-20 NOTE — Consult Note (Signed)
   Consultation  Referring Provider:     Nathan Pickering Primary Care Physician:  Patient, No Pcp Per Primary Gastroenterologist:        unassigned Reason for Consultation:     Foreign body ingestion         HPI:   David Mcconnell is a 37 y.o. male with a significant mental health history, depression, multiple suicide attempts, history of intentional foreign body ingestion, called from the ED today for sheet metal ingestion. He ingested 6 pieces of sheet metal yesterday actually, took 2.5 hours with [facility] / Dr. Ganem to remove them which was technically quite challenging. He reports once he got back to his room last night, he had additional sheet metal pieces in his room and swallowed another 4 pieces. He states he is doing this to "get a surgeon to operate on my abdominal wall to remove a retained nail". He is hoping he can have an operation on his abdomen at the same time as endoscopy. He otherwise ate "a light lunch" about 4 hours ago. His throat is extremely sore from the procedure yesterday. Metal would not fit through an overtube and was pulled out with forceps. He denies abdominal pains. No chest pains. No fevers. Xray shows 4 metal objects in the stomach.  Past Medical History:  Diagnosis Date  . Cocaine abuse (HCC)     Past Surgical History:  Procedure Laterality Date  . ENTEROSCOPY N/A 04/09/2018   Procedure: ENTEROSCOPY;  Surgeon: Mansouraty, Gabriel Jr., MD;  Location: [facility] ENDOSCOPY;  Service: Gastroenterology;  Laterality: N/A;    History reviewed. No pertinent family history.   Social History   Tobacco Use  . Smoking status: Current Some Day Smoker  . Smokeless tobacco: Never Used  Substance Use Topics  . Alcohol use: Not Currently  . Drug use: Yes    Types: Cocaine    Prior to Admission medications   Medication Sig Start Date End Date Taking? Authorizing Provider  buPROPion (WELLBUTRIN XL) 150 MG 24 hr tablet Take 150 mg by mouth daily.   Yes [provider]  hydrOXYzine (ATARAX/VISTARIL) 25 MG tablet Take 25 mg by mouth 3 (three) times daily as needed for anxiety.   Yes [provider]  mirtazapine (REMERON) 15 MG tablet Take 7.5-15 mg by mouth See admin instructions. Taking 1/2 (7.5 mg) to one tablet (15mg) as needed at bedtime for sleep   Yes [provider]  pantoprazole (PROTONIX) 20 MG tablet Take 1 tablet (20 mg total) by mouth daily. 04/13/18 05/13/18 Yes Prince, Jamie M, MD  acetaminophen (TYLENOL) 500 MG tablet Take 1,000 mg by mouth every 6 (six) hours as needed for mild pain.    [provider]  cyanocobalamin (,VITAMIN B-12,) 1000 MCG/ML injection Inject 1,000 mcg into the muscle every 30 (thirty) days.    [provider]    Current Facility-Administered Medications  Medication Dose Route Frequency Provider Last Rate Last Dose  . [MAR Hold] acetaminophen (TYLENOL) tablet 1,000 mg  1,000 mg Oral Q6H PRN Curatolo, Adam, DO   1,000 mg at 04/20/18 0822  . [MAR Hold] buPROPion (WELLBUTRIN XL) 24 hr tablet 150 mg  150 mg Oral Daily Curatolo, Adam, DO   150 mg at 04/20/18 0900  . [MAR Hold] gabapentin (NEURONTIN) capsule 300 mg  300 mg Oral TID Lord, Jamison Y, NP   300 mg at 04/20/18 1144  . [MAR Hold] hydrOXYzine (ATARAX/VISTARIL) tablet 25 mg  25 mg Oral TID PRN   Curatolo, Adam, DO   25 mg at 04/20/18 0929  . [MAR Hold] ibuprofen (ADVIL,MOTRIN) tablet 800 mg  800 mg Oral Q6H PRN Lord, Jamison Y, NP   800 mg at 04/20/18 1212  . [MAR Hold] menthol-cetylpyridinium (CEPACOL) lozenge 3 mg  1 lozenge Oral PRN Butler, Michael C, MD   3 mg at 04/20/18 0855  . [MAR Hold] mirtazapine (REMERON) tablet 7.5 mg  7.5 mg Oral QHS PRN Lord, Jamison Y, NP      . [MAR Hold] nicotine (NICODERM CQ - dosed in mg/24 hours) patch 21 mg  21 mg Transdermal Daily Butler, Michael C, MD   21 mg at 04/20/18 0900  . [MAR Hold] pantoprazole (PROTONIX) EC tablet 20 mg  20 mg Oral Daily Curatolo, Adam, DO   20 mg at 04/20/18  0900    Allergies as of 04/18/2018 - Review Complete 04/18/2018  Allergen Reaction Noted  . Tegretol [carbamazepine] Rash and Other (See Comments) 04/01/2018     Review of Systems:    As per HPI, otherwise negative    Physical Exam:  Vital signs in last 24 hours: Temp:  [98 F (36.7 C)-98.4 F (36.9 C)] 98.4 F (36.9 C) (11/02 1600) Pulse Rate:  [69-88] 82 (11/02 1600) Resp:  [16-20] 20 (11/02 1600) BP: (121-139)/(60-92) 127/82 (11/02 1600) SpO2:  [97 %-100 %] 97 % (11/02 1600)   General:   Pleasant male in NAD Lungs:  Respirations even and unlabored. Lungs clear to auscultation bilaterally.    Heart:  Regular rate and rhythm; no MRG Abdomen:  Soft, nondistended, nontender. . No appreciable masses or hepatomegaly. Abdominal scars noted Rectal:  Not performed.  Msk:  Symmetrical without gross deformities.  Extremities:  Without edema. Neurologic:  Alert and  oriented x4;  grossly normal neurologically. Skin:  Intact without significant lesions or rashes. Psych:  Alert and cooperative. Normal affect.  LAB RESULTS: Recent Labs    04/18/18 1806  WBC 11.3*  HGB 14.1  HCT 44.8  PLT 240   BMET Recent Labs    04/18/18 1806  NA 139  K 3.6  CL 104  CO2 22  GLUCOSE 95  BUN 11  CREATININE 1.18  CALCIUM 9.4   LFT Recent Labs    04/18/18 1806  PROT 7.4  ALBUMIN 4.6  AST 21  ALT 37  ALKPHOS 67  BILITOT 0.6   PT/INR No results for input(s): LABPROT, INR in the last 72 hours.  STUDIES:     Impression / Plan:   37 y/o male with a history of foreign body ingestions. He had multiple interventions/laparotomies per his report including at least 5 or 6 evaluations of a small bowel requiring excisions and removals. He had a prolonged EGD yesterday for removal of sheet metal which was successful. He again ingested more sheet metal last night, another 4 pieces in hopes that someone will operate on his abdomen. I counseled him that swallowing metal will not lead to  an operation for removal of retained old metal in his abdominal wall a different site. I counseled him on the risks of bowel perforation and bleeding with foreign body ingestion. I'm recommending an EGD with anesthesia to remove the retained foreign bodies - he will be electively intubated for this. Use of over-tube was not successful yesterday as the objects are too large to fit through it. Significant risks for bleeding and perforation are possible with this exam. He also ate recently which make may this even more difficult. I spoke with   General surgery Dr. Blackmon who is on call and aware of this case should removal not be successful and operative removal is needed. Patient verbalized understanding and agreed with the plan.  Steven Armbruster, MD [facility]     

## 2018-04-20 NOTE — Anesthesia Preprocedure Evaluation (Addendum)
Anesthesia Evaluation  Patient identified by MRN, date of birth, ID band Patient confused    Reviewed: Allergy & Precautions, NPO status , Patient's Chart, lab work & pertinent test results  History of Anesthesia Complications Negative for: history of anesthetic complications  Airway Mallampati: II  TM Distance: >3 FB Neck ROM: Full    Dental  (+) Dental Advisory Given, Teeth Intact   Pulmonary neg pulmonary ROS, Current Smoker,    breath sounds clear to auscultation       Cardiovascular negative cardio ROS   Rhythm:Regular Rate:Normal     Neuro/Psych PSYCHIATRIC DISORDERS Depression negative neurological ROS     GI/Hepatic negative GI ROS, Neg liver ROS, Medicated,(+)       cocaine use,   Endo/Other  negative endocrine ROS  Renal/GU negative Renal ROS  negative genitourinary   Musculoskeletal negative musculoskeletal ROS (+)   Abdominal   Peds  Hematology negative hematology ROS (+)   Anesthesia Other Findings Foreign body ingestion  H/o SI, h/o serotonin syndrome  Last ate lunch at 1pm, last drank coffee with cream at 3pm  Reproductive/Obstetrics                           Anesthesia Physical  Anesthesia Plan  ASA: III and emergent  Anesthesia Plan: General   Post-op Pain Management:    Induction: Intravenous and Rapid sequence  PONV Risk Score and Plan: 2 and Treatment may vary due to age or medical condition, Ondansetron and Dexamethasone  Airway Management Planned: Oral ETT  Additional Equipment: None  Intra-op Plan:   Post-operative Plan: Extubation in OR  Informed Consent: I have reviewed the patients History and Physical, chart, labs and discussed the procedure including the risks, benefits and alternatives for the proposed anesthesia with the patient or authorized representative who has indicated his/her understanding and acceptance.   Dental advisory  given  Plan Discussed with: CRNA and Anesthesiologist  Anesthesia Plan Comments:        Anesthesia Quick Evaluation

## 2018-04-20 NOTE — Consult Note (Addendum)
[place] Psychiatry Consult   Reason for Consult:  Swallowing objects Referring Physician:  EDP Patient Identification: David Mcconnell MRN:  [id number] Principal Diagnosis:   Diagnosis:   Patient Active Problem List   Diagnosis Date Noted  . Cocaine abuse ([place]) [F14.10] 04/20/2018    Priority: High  . Major depressive disorder, recurrent severe without psychotic features ([place]) [F33.2] 04/01/2018    Priority: High  . Suicide attempt ([place]) [T14.91XA]   . Foreign body in digestive system [T18.9XXA]   . Serotonin syndrome [G25.79] 03/31/2018  . Intentional drug overdose ([place]) [T50.902A]     Total Time spent with patient: 30 minutes  Subjective:   David Mcconnell is a 37 y.o. male patient complains of abdominal pain.  HPI:  37 yo male who presented to the ED   History of placing things in his abdomen and swallowing things intentionally since 2012 when he was in prison.  He intentionally overdosed on 10/12 to see if his girlfriend loved him but denied trying to kill himself.  Seraj also swallowed something at that time.  On this admission, he swallowed additional things and calmly asks for specific narcotics but no signs of pain and wanted an increase to a solid diet shortly after surgery. Continues to deny suicidal ideations.   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  Cocaine abuse but minimizes his use.  Today, he is busy talking to his sitter in a flirtatious manner, up to the shower without issues.  He demonstrates traits of antisocial and borderline personality disorder. The last time this provider saw him, he threatened to tear up [facility] if he was admitted because he did not want to go--calmly said this as he was lying in his bed.    This afternoon on assessment, he is pleasantly eating heartily and chatting with his sitter.  Continues to deny he was suicidal or threats to hurt himself.  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  "A psychiatrist at [facility] said it was manipulation but I was doing it on purpose to get what I wanted."  When this provider let him know this was a definition of manipulation he laughed heartily and agreed.  Then asked what his motive was at this time.  He said he was upset with himself and stuck the nail and pen in an old endoscopic scar.  Then when he did not get it removed, intentionally swallowed metal pieces he hid in the ED so they would have to do surgery and remove everything.  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  Now, he is claiming he swallowed additionally things so they will remove everything.  Psychiatry, nor medications, can control this behavior.  No threats to others or self or hallucinations.  Plan is to discharge him from psychiatry tomorrow when the psychiatrist returns.  Past Psychiatric History: depression, substance abuse  Risk to Self: Suicidal Ideation: No(Pt denies. ) Suicidal Intent: No Is patient at risk for suicide?: No Suicidal Plan?: No Access to Means: No What has been your use of drugs/alcohol within the last 12 months?: Cocaine, alcohol and cigarettes. How many times?: 1 Other Self Harm Risks: Swallowing four metal pieces, inserting ink cartridge from pen in his abdomen.  Triggers for Past Attempts: Unknown Intentional Self Injurious Behavior: Damaging Comment - Self Injurious Behavior: Swallowing four metal pieces, inserting ink cartridge from pen in his abdomen.  Risk to Others: Homicidal Ideation: No(Pt denies. ) Thoughts of Harm to Others: No Current Homicidal Intent: No Current Homicidal Plan: No Access to Homicidal Means: No Identified Victim: NA History of harm to others?: No Assessment of Violence: None Noted Violent Behavior  Description: NA Does patient have access to weapons?: No(Pt denies. ) Criminal Charges Pending?: No Does patient have a court date: No Prior Inpatient Therapy:   Prior Outpatient Therapy: Prior Outpatient Therapy: Yes Prior Therapy Dates: Current Prior Therapy Facilty/Provider(s): [facility].  Reason for Treatment: Medication management. Does patient have an ACCT team?: No Does patient have Intensive In-House Services?  : No Does patient have [facility] services? : Yes Does patient have P4CC services?: No  Past Medical History:  Past Medical History:  Diagnosis Date  . Cocaine abuse [facility])     Past Surgical History:  Procedure Laterality Date  . ENTEROSCOPY N/A 04/09/2018   Procedure: ENTEROSCOPY;  Surgeon: Mansouraty, Telford Nab., MD;  Location: [place];  Service: Gastroenterology;  Laterality: N/A;   Family History: History reviewed. No pertinent family history. Family Psychiatric  History: none Social History:  Social History   Substance and Sexual Activity  Alcohol Use Not Currently     Social History   Substance and Sexual Activity  Drug Use Yes  . Types: Cocaine    Social History   Socioeconomic History  . Marital status: Single    Spouse name: Not on file  . Number of children: Not on file  . Years of education: Not on file  . Highest education level: Not on file  Occupational History  . Not on file  Social Needs  . Financial resource strain: Not on file  . Food insecurity:    Worry: Not on file    Inability: Not on file  . Transportation needs:    Medical: Not on file    Non-medical: Not on file  Tobacco Use  . Smoking status: Current Some Day Smoker  . Smokeless tobacco: Never Used  Substance and Sexual Activity  . Alcohol use: Not Currently  . Drug use: Yes    Types: Cocaine  . Sexual activity: Not on file  Lifestyle  . Physical activity:    Days per week: Not on file    Minutes per session: Not on file  . Stress: Not on file  Relationships   . Social connections:    Talks on phone: Not on file    Gets together: Not on file    Attends religious service: Not on file    Active member of club or organization: Not on file    Attends meetings of clubs or organizations: Not on file    Relationship status: Not on file  Other Topics Concern  . Not on file  Social History Narrative  . Not on file   Additional Social History:    Allergies:   Allergies  Allergen Reactions  . Tegretol [Carbamazepine] Rash and Other (See Comments)    Blisters in mouth    Labs:  Results for orders placed or performed during the hospital encounter of 04/18/18 (from the past 48 hour(s))  Ethanol     Status: Abnormal   Collection Time: 04/18/18  5:59 PM  Result Value Ref Range   Alcohol, Ethyl (B) 12 (H) <10 mg/dL    Comment: (NOTE) Lowest detectable  limit for serum alcohol is 10 mg/dL. For medical purposes only. Performed at [facility], [place] [address], [place], [place] [address]   Urine rapid drug screen (hosp performed)     Status: Abnormal   Collection Time: 04/18/18  5:59 PM  Result Value Ref Range   Opiates NONE DETECTED NONE DETECTED   Cocaine POSITIVE (A) NONE DETECTED   Benzodiazepines NONE DETECTED NONE DETECTED   Amphetamines NONE DETECTED NONE DETECTED   Tetrahydrocannabinol NONE DETECTED NONE DETECTED   Barbiturates NONE DETECTED NONE DETECTED    Comment: (NOTE) DRUG SCREEN FOR MEDICAL PURPOSES ONLY.  IF CONFIRMATION IS NEEDED FOR ANY PURPOSE, NOTIFY LAB WITHIN 5 DAYS. LOWEST DETECTABLE LIMITS FOR URINE DRUG SCREEN Drug Class                     Cutoff (ng/mL) Amphetamine and metabolites    1000 Barbiturate and metabolites    200 Benzodiazepine                 478 Tricyclics and metabolites     300 Opiates and metabolites        300 Cocaine and metabolites        300 THC                            50 Performed at [facility], [place] [address], [place], [place] [address]    Acetaminophen level     Status: Abnormal   Collection Time: 04/18/18  5:59 PM  Result Value Ref Range   Acetaminophen (Tylenol), Serum <10 (L) 10 - 30 ug/mL    Comment: (NOTE) Therapeutic concentrations vary significantly. A range of 10-30 ug/mL  may be an effective concentration for many patients. However, some  are best treated at concentrations outside of this range. Acetaminophen concentrations >150 ug/mL at 4 hours after ingestion  and >50 ug/mL at 12 hours after ingestion are often associated with  toxic reactions. Performed at [facility], [place] [address] [place], [place] [address]   Salicylate level     Status: None   Collection Time: 04/18/18  5:59 PM  Result Value Ref Range   Salicylate Lvl <5.7 2.8 - 30.0 mg/dL    Comment: Performed at [facility], [place] [address] [place],  [address]  CBC with Differential     Status: Abnormal   Collection Time: 04/18/18  6:06 PM  Result Value Ref Range   WBC 11.3 (H) 4.0 - 10.5 K/uL   RBC 5.12 4.22 - 5.81 MIL/uL   Hemoglobin 14.1 13.0 - 17.0 g/dL   HCT 44.8 39.0 - 52.0 %   MCV 87.5 80.0 - 100.0 fL   MCH 27.5 26.0 - 34.0 pg   MCHC 31.5 30.0 - 36.0 g/dL   RDW 13.2 11.5 - 15.5 %   Platelets 240 150 - 400 K/uL   nRBC 0.0 0.0 - 0.2 %   Neutrophils Relative % 83 %   Neutro Abs 9.3 (H) 1.7 - 7.7 K/uL   Lymphocytes Relative 11 %   Lymphs Abs 1.2 0.7 - 4.0 K/uL   Monocytes Relative 5 %   Monocytes Absolute 0.6 0.1 - 1.0 K/uL   Eosinophils Relative 1 %   Eosinophils Absolute 0.1 0.0 - 0.5 K/uL   Basophils Relative 0 %   Basophils Absolute 0.0 0.0 - 0.1 K/uL   Immature Granulocytes 0 %   Abs Immature  Granulocytes 0.05 0.00 - 0.07 K/uL    Comment: Performed at [facility], [place] [address], [place], [place] [address]  Comprehensive metabolic panel     Status: None   Collection Time: 04/18/18  6:06 PM  Result Value Ref Range   Sodium 139 135 - 145 mmol/L   Potassium 3.6  3.5 - 5.1 mmol/L   Chloride 104 98 - 111 mmol/L   CO2 22 22 - 32 mmol/L   Glucose, Bld 95 70 - 99 mg/dL   BUN 11 6 - 20 mg/dL   Creatinine, Ser 1.18 0.61 - 1.24 mg/dL   Calcium 9.4 8.9 - 10.3 mg/dL   Total Protein 7.4 6.5 - 8.1 g/dL   Albumin 4.6 3.5 - 5.0 g/dL   AST 21 15 - 41 U/L   ALT 37 0 - 44 U/L   Alkaline Phosphatase 67 38 - 126 U/L   Total Bilirubin 0.6 0.3 - 1.2 mg/dL   GFR calc non Af Amer >60 >60 mL/min   GFR calc Af Amer >60 >60 mL/min    Comment: (NOTE) The eGFR has been calculated using the CKD EPI equation. This calculation has not been validated in all clinical situations. eGFR's persistently <60 mL/min signify possible Chronic Kidney Disease.    Anion gap 13 5 - 15    Comment: Performed at [facility], [place] [address], [place], [place] [address]    Current Facility-Administered Medications  Medication Dose Route Frequency Provider Last Rate Last Dose  . acetaminophen (TYLENOL) tablet 1,000 mg  1,000 mg Oral Q6H PRN Curatolo, Adam, DO   1,000 mg at 04/20/18 7062  . buPROPion (WELLBUTRIN XL) 24 hr tablet 150 mg  150 mg Oral Daily Curatolo, Adam, DO   150 mg at 04/20/18 0900  . hydrOXYzine (ATARAX/VISTARIL) tablet 25 mg  25 mg Oral TID PRN Lennice Sites, DO   25 mg at 04/20/18 0929  . menthol-cetylpyridinium (CEPACOL) lozenge 3 mg  1 lozenge Oral PRN Hayden Rasmussen, MD   3 mg at 04/20/18 0855  . mirtazapine (REMERON) tablet 7.5-15 mg  7.5-15 mg Oral QHS PRN Curatolo, Adam, DO   7.5 mg at 04/19/18 2144  . nicotine (NICODERM CQ - dosed in mg/24 hours) patch 21 mg  21 mg Transdermal Daily Hayden Rasmussen, MD   21 mg at 04/20/18 0900  . pantoprazole (PROTONIX) EC tablet 20 mg  20 mg Oral Daily Curatolo, Adam, DO   20 mg at 04/20/18 0900  . thiamine (VITAMIN B-1) tablet 100 mg  100 mg Oral Daily Curatolo, Adam, DO   100 mg at 04/20/18 0900   Or  . thiamine (B-1) injection 100 mg  100 mg Intravenous Daily Curatolo, Adam, DO       Current  Outpatient Medications  Medication Sig Dispense Refill  . buPROPion (WELLBUTRIN XL) 150 MG 24 hr tablet Take 150 mg by mouth daily.    . hydrOXYzine (ATARAX/VISTARIL) 25 MG tablet Take 25 mg by mouth 3 (three) times daily as needed for anxiety.    . mirtazapine (REMERON) 15 MG tablet Take 7.5-15 mg by mouth See admin instructions. Taking 1/2 (7.5 mg) to one tablet (75m) as needed at bedtime for sleep    . pantoprazole (PROTONIX) 20 MG tablet Take 1 tablet (20 mg total) by mouth daily. 30 tablet 0  . acetaminophen (TYLENOL) 500 MG tablet Take 1,000 mg by mouth every 6 (six) hours as needed for mild pain.    . cyanocobalamin (,  VITAMIN B-12,) 1000 MCG/ML injection Inject 1,000 mcg into the muscle every 30 (thirty) days.      Musculoskeletal: Strength & Muscle Tone: within normal limits Gait & Station: normal Patient leans: N/A  Psychiatric Specialty Exam: Physical Exam  Nursing note and vitals reviewed. Constitutional: He is oriented to person, place, and time. He appears well-developed and well-nourished.  HENT:  Head: Normocephalic.  Neck: Normal range of motion.  Respiratory: Effort normal.  GI: There is tenderness.  Musculoskeletal: Normal range of motion.  Neurological: He is alert and oriented to person, place, and time.  Psychiatric: His speech is normal and behavior is normal. Thought content normal. His affect is blunt. Cognition and memory are normal. He expresses impulsivity.    Review of Systems  Gastrointestinal: Positive for abdominal pain.  Psychiatric/Behavioral: Positive for substance abuse.  All other systems reviewed and are negative.   Blood pressure 121/60, pulse 69, temperature 98.1 F (36.7 C), temperature source Oral, resp. rate 20, SpO2 100 %.There is no height or weight on file to calculate BMI.  General Appearance: Casual  Eye Contact:  Good  Speech:  Normal Rate  Volume:  Normal  Mood:  Euthymic  Affect:  Flat  Thought Process:  Coherent and  Descriptions of Associations: Intact  Orientation:  Full (Time, Place, and Person)  Thought Content:  WDL and Logical  Suicidal Thoughts:  No  Homicidal Thoughts:  No  Memory:  Immediate;   Good Recent;   Good Remote;   Good  Judgement:  Fair  Insight:  Fair  Psychomotor Activity:  Normal  Concentration:  Concentration: Good and Attention Span: Good  Recall:  Good  Fund of Knowledge:  Good  Language:  Good  Akathisia:  No  Handed:  Right  AIMS (if indicated):     Assets:  Leisure Time Physical Health Resilience Social Support  ADL's:  Intact  Cognition:  WNL  Sleep:        Treatment Plan Summary: Daily contact with patient to assess and evaluate symptoms and progress in treatment, Medication management and Plan cocaine abuse with cocaine induced mood disorder:  -Continue Wellbutrin 150 mg daily -Continue Vistaril 25 mg TID PRN anxiety -Continue Remeron 7.5 mg at bedtime for sleep -Started gabapentin 300 mg TID for withdrawal symptoms  Disposition: Supportive therapy provided about ongoing stressors.  Waylan Boga, NP 04/20/2018 11:10 AM   Patient seen face to face for this evaluation, case discussed with treatment team and physician extender and formulated treatment plan. Reviewed the information documented and agree with the treatment plan.  Ambrose Finland, MD 04/20/2018

## 2018-04-20 NOTE — ED Provider Notes (Addendum)
  Physical Exam  BP 139/85 (BP Location: Right Arm)   Pulse 88   Temp 98.1 F (36.7 C) (Oral)   Resp 18   SpO2 99%   Physical Exam  ED Course/Procedures   Clinical Course as of Apr 20 1542  Fri Apr 19, 2018  1800 Valuated the patient and return to the department.  He is complaining of some moderate throat pain from the intubation and the procedure.  He is asking to have some Percocet for that.  He also feels like he can advance his diet fairly quickly he would like to get a regular diet.  From my standing patient is medically cleared and can be returned back into the care of psychiatry.   [MB]    Clinical Course User Index [MB] Terrilee Files, MD    Procedures  MDM  Patient states that after endoscopy yesterday when he was put back into the psych area he took more metal and swallowed it again.  States that he wants the nail in his abdomen taken out while he is sedated.  No abdominal pain.  Will repeat x-ray.  Psychiatry is seen patient and states they will clear him for discharge tomorrow.  They state he is doing this as manipulation.     Benjiman Core, MD 04/20/18 1343  Discussed with [facility].  He will review imaging and make plan for patient    Benjiman Core, MD 04/20/18 [phone]

## 2018-04-20 NOTE — Interval H&P Note (Signed)
History and Physical Interval Note:  04/20/2018 5:23 PM  David Mcconnell  has presented today for surgery, with the diagnosis of foreign body removal  The various methods of treatment have been discussed with the patient and family. After consideration of risks, benefits and other options for treatment, the patient has consented to  Procedure(s): ESOPHAGOGASTRODUODENOSCOPY (EGD) WITH PROPOFOL (N/A) as a surgical intervention .  The patient's history has been reviewed, patient examined, no change in status, stable for surgery.  I have reviewed the patient's chart and labs.  Questions were answered to the patient's satisfaction.     [name]

## 2018-04-20 NOTE — ED Notes (Signed)
Pt off unit. Taken to Endo for foreign objects removal from abdomen

## 2018-04-20 NOTE — Anesthesia Postprocedure Evaluation (Signed)
Anesthesia Post Note  Patient: David Mcconnell  Procedure(s) Performed: ESOPHAGOGASTRODUODENOSCOPY (EGD) WITH PROPOFOL (N/A ) FOREIGN BODY REMOVAL     Patient location during evaluation: PACU Anesthesia Type: General Level of consciousness: awake and alert Pain management: pain level controlled Vital Signs Assessment: post-procedure vital signs reviewed and stable Respiratory status: spontaneous breathing, nonlabored ventilation, respiratory function stable and patient connected to nasal cannula oxygen Cardiovascular status: blood pressure returned to baseline and stable Postop Assessment: no apparent nausea or vomiting Anesthetic complications: no    Last Vitals:  Vitals:   04/20/18 2000 04/20/18 2015  BP: 130/73 127/75  Pulse: 85 86  Resp: 12 10  Temp: 37.2 C   SpO2: 100% 99%    Last Pain:  Vitals:   04/20/18 2000  TempSrc:   PainSc: 0-No pain                 Landen Breeland L Tarea Skillman

## 2018-04-20 NOTE — Op Note (Addendum)
[facility] Patient Name: David Mcconnell Procedure Date: 04/20/2018 MRN: [id number] Attending MD: Carlota Raspberry. Havery Moros , MD Date of Birth: [birth year] CSN: [id number] Age: 37 Admit Type: Emergency Department Procedure:                Upper GI endoscopy Indications:              Foreign body in the stomach - patient with history                            of foreign body, swallowed multiple pieces of sheet                            metal yesterday which were retrieved in 2.5 hour                            endoscopy. Post procedure he swallowed another 4                            pieces of sheet metal and presented back to the ED                            today Providers:                Remo Lipps P. Havery Moros, MD, Elna Breslow, RN, Charolette Child, Technician, Glenis Smoker, CRNA Referring MD:              Medicines:                Monitored Anesthesia Care Complications:            No immediate complications. Estimated blood loss:                            Minimal. Estimated Blood Loss:     Estimated blood loss was minimal. Procedure:                Pre-Anesthesia Assessment:                           - Prior to the procedure, a History and Physical                            was performed, and patient medications and                            allergies were reviewed. The patient's tolerance of                            previous anesthesia was also reviewed. The risks                            and benefits of the procedure and the sedation  options and risks were discussed with the patient.                            All questions were answered, and informed consent                            was obtained. Prior Anticoagulants: The patient has                            taken no previous anticoagulant or antiplatelet                            agents. ASA Grade Assessment: II - A patient with         mild systemic disease. After reviewing the risks                            and benefits, the patient was deemed in                            satisfactory condition to undergo the procedure.                           After obtaining informed consent, the endoscope was                            passed under direct vision. Throughout the                            procedure, the patient's blood pressure, pulse, and                            oxygen saturations were monitored continuously. The                            GIF-H190 ([id number]) Olympus adult endoscope was                            introduced through the mouth, and advanced to the                            second part of duodenum. The upper GI endoscopy was                            technically difficult and complex due to presence                            of foreign body. The patient tolerated the                            procedure well. Scope In: Scope Out: Findings:      The examined esophagus was normal.      4 pieces of sheet metal, folded over on itself with one dull end, one  sharp end were found in the gastric body. The pieces were measured to be       7cm x 2.5cm each, same size as metal pieces ingested yesterday. Multiple       superficial lacerations with adherent heme were noted in the stomach       upon entery. Attempts yesterday to remove these were not successful       using overtube (pieces were too wide for it), thus this was not       attempted. Removal was accomplished with a rat-toothed forceps /       Aligator forceps to bring each piece into the esophagus holding the dull       end of the metal piece and sharp end of the piece distally to minimize       chance of laceration. It was difficult to get each piece into the       esophagus from the GEJ due to the length of it. Attempts at using a hood       were not successful, this appeared to make it more difficult to get the       object into  the esophagus and made it difficult for the forceps to grasp       the metal (hood pushed forceps off). Once into the esophagus each piece       was dragged proximally up through the UES into the posterior pharynx.       There was significant edema and a hematoma from yesterday's procedure in       the proximal esophagus. Once into the posterior pharynx, the       anesthesiologist used a glidoscope and Mcgill forceps to pull each piece       out of the mouth. There were multiple superficial abrasions in the       posterior pharynx while doing this but no significant bleeding. Removing       each piece was tedious and took roughly 2 hours for complete removal of       all 4 pieces.      Diffuse inflammation characterized by adherent blood was found in the       entire examined stomach with multiple superficial lacerations.      The exam of the stomach was otherwise normal.      The duodenal bulb and second portion of the duodenum were normal. Impression:               - Normal esophagus.                           - 4 pieces of sheet metal were found in the                            stomach. Removal was successful as outlined above.                           - Gastritis due to foreign body ingestion as above.                           - Normal duodenal bulb and second portion of the  duodenum. Moderate Sedation:      No moderate sedation, case performed with MAC Recommendation:           - Return patient to hospital ward for ongoing care.                           - Liquids following recovery in anesthesia post                            extubation                           - Continue present medications.                           - Omeprazole 73m twice daily for treatment of                            gastritis                           - Cepacol lozenges okay to use - the patient should                            expect to have a sore throat from this                            - PLEASE do not ingest any more foreign body objects                           - Recommendations per psychiatry regarding                            disposition / management of this issue Procedure Code(s):        --- Professional ---                           [id number] Esophagogastroduodenoscopy, flexible,                            transoral; with removal of foreign body(s) Diagnosis Code(s):        --- Professional ---                           TQ33.3LKT Foreign body in stomach, initial encounter                           K29.70, Gastritis, unspecified, without bleeding CPT copyright 2018 American Medical Association. All rights reserved. The codes documented in this report are preliminary and upon coder review may  be revised to meet current compliance requirements. SRemo LippsP. Luisfelipe Engelstad, MD 04/20/2018 8:00:23 PM This report has been signed electronically. Number of Addenda: 0

## 2018-04-20 NOTE — Anesthesia Procedure Notes (Signed)
Procedure Name: Intubation Date/Time: 04/20/2018 5:26 PM Performed by: Minerva Ends, CRNA Pre-anesthesia Checklist: Patient identified, Emergency Drugs available, Suction available and Patient being monitored Patient Re-evaluated:Patient Re-evaluated prior to induction Oxygen Delivery Method: Circle System Utilized Preoxygenation: Pre-oxygenation with 100% oxygen Induction Type: IV induction Ventilation: Mask ventilation without difficulty Laryngoscope Size: Miller and 2 Grade View: Grade I Tube type: Oral Number of attempts: 1 Airway Equipment and Method: Stylet Placement Confirmation: ETT inserted through vocal cords under direct vision,  positive ETCO2 and breath sounds checked- equal and bilateral Secured at: 22 cm Tube secured with: Tape Dental Injury: Teeth and Oropharynx as per pre-operative assessment  Comments: Smooth RSI by Armond Hang--- cricoid pressure-- intubation CRNA atraumatic-- teeth -- front  with chipping prior to the laryngoscopy-- bilat BS

## 2018-04-20 NOTE — ED Notes (Signed)
Bed: [facility] Expected date:  Expected time:  Means of arrival:  Comments: Sai, Zinn

## 2018-04-20 NOTE — ED Notes (Signed)
Pt presents from Endo, A&O x 4, gait steady, no distress noted, calm & cooperative, Sitter at bedside.  Pt laying on mattress at present, safety check for room and person performed with assist of Security and [employer].  New Scrubs and socks given.  Meal given.  Monitoring for safety, Q 15 min checks in progress at present.

## 2018-04-20 NOTE — H&P (View-Only) (Signed)
Consultation  Referring Provider:     Benjiman Core Primary Care Physician:  Patient, No Pcp Per Primary Gastroenterologist:        unassigned Reason for Consultation:     Foreign body ingestion         HPI:   David Mcconnell is a 37 y.o. male with a significant mental health history, depression, multiple suicide attempts, history of intentional foreign body ingestion, called from the ED today for sheet metal ingestion. He ingested 6 pieces of sheet metal yesterday actually, took 2.5 hours with [facility] / Dr. Evette Cristal to remove them which was technically quite challenging. He reports once he got back to his room last night, he had additional sheet metal pieces in his room and swallowed another 4 pieces. He states he is doing this to "get a surgeon to operate on my abdominal wall to remove a retained nail". He is hoping he can have an operation on his abdomen at the same time as endoscopy. He otherwise ate "a light lunch" about 4 hours ago. His throat is extremely sore from the procedure yesterday. Metal would not fit through an overtube and was pulled out with forceps. He denies abdominal pains. No chest pains. No fevers. Xray shows 4 metal objects in the stomach.  Past Medical History:  Diagnosis Date  . Cocaine abuse [facility])     Past Surgical History:  Procedure Laterality Date  . ENTEROSCOPY N/A 04/09/2018   Procedure: ENTEROSCOPY;  Surgeon: Mansouraty, Netty Starring., MD;  Location: [facility] ENDOSCOPY;  Service: Gastroenterology;  Laterality: N/A;    History reviewed. No pertinent family history.   Social History   Tobacco Use  . Smoking status: Current Some Day Smoker  . Smokeless tobacco: Never Used  Substance Use Topics  . Alcohol use: Not Currently  . Drug use: Yes    Types: Cocaine    Prior to Admission medications   Medication Sig Start Date End Date Taking? Authorizing Provider  buPROPion (WELLBUTRIN XL) 150 MG 24 hr tablet Take 150 mg by mouth daily.   Yes [provider]  hydrOXYzine (ATARAX/VISTARIL) 25 MG tablet Take 25 mg by mouth 3 (three) times daily as needed for anxiety.   Yes [provider]  mirtazapine (REMERON) 15 MG tablet Take 7.5-15 mg by mouth See admin instructions. Taking 1/2 (7.5 mg) to one tablet (15mg ) as needed at bedtime for sleep   Yes [provider]  pantoprazole (PROTONIX) 20 MG tablet Take 1 tablet (20 mg total) by mouth daily. 04/13/18 05/13/18 Yes Synetta Shadow, MD  acetaminophen (TYLENOL) 500 MG tablet Take 1,000 mg by mouth every 6 (six) hours as needed for mild pain.    [provider]  cyanocobalamin (,VITAMIN B-12,) 1000 MCG/ML injection Inject 1,000 mcg into the muscle every 30 (thirty) days.    [provider]    Current Facility-Administered Medications  Medication Dose Route Frequency Provider Last Rate Last Dose  . [MAR Hold] acetaminophen (TYLENOL) tablet 1,000 mg  1,000 mg Oral Q6H PRN Curatolo, Adam, DO   1,000 mg at 04/20/18 1610  . [MAR Hold] buPROPion (WELLBUTRIN XL) 24 hr tablet 150 mg  150 mg Oral Daily Curatolo, Adam, DO   150 mg at 04/20/18 0900  . [MAR Hold] gabapentin (NEURONTIN) capsule 300 mg  300 mg Oral TID Charm Rings, NP   300 mg at 04/20/18 1144  . [MAR Hold] hydrOXYzine (ATARAX/VISTARIL) tablet 25 mg  25 mg Oral TID PRN  Curatolo, Adam, DO   25 mg at 04/20/18 0347  . [MAR Hold] ibuprofen (ADVIL,MOTRIN) tablet 800 mg  800 mg Oral Q6H PRN Charm Rings, NP   800 mg at 04/20/18 1212  . [MAR Hold] menthol-cetylpyridinium (CEPACOL) lozenge 3 mg  1 lozenge Oral PRN Terrilee Files, MD   3 mg at 04/20/18 0855  . [MAR Hold] mirtazapine (REMERON) tablet 7.5 mg  7.5 mg Oral QHS PRN Charm Rings, NP      . Mitzi Hansen Hold] nicotine (NICODERM CQ - dosed in mg/24 hours) patch 21 mg  21 mg Transdermal Daily Terrilee Files, MD   21 mg at 04/20/18 0900  . [MAR Hold] pantoprazole (PROTONIX) EC tablet 20 mg  20 mg Oral Daily Curatolo, Adam, DO   20 mg at 04/20/18  0900    Allergies as of 04/18/2018 - Review Complete 04/18/2018  Allergen Reaction Noted  . Tegretol [carbamazepine] Rash and Other (See Comments) 04/01/2018     Review of Systems:    As per HPI, otherwise negative    Physical Exam:  Vital signs in last 24 hours: Temp:  [98 F (36.7 C)-98.4 F (36.9 C)] 98.4 F (36.9 C) (11/02 1600) Pulse Rate:  [69-88] 82 (11/02 1600) Resp:  [16-20] 20 (11/02 1600) BP: (121-139)/(60-92) 127/82 (11/02 1600) SpO2:  [97 %-100 %] 97 % (11/02 1600)   General:   Pleasant male in NAD Lungs:  Respirations even and unlabored. Lungs clear to auscultation bilaterally.    Heart:  Regular rate and rhythm; no MRG Abdomen:  Soft, nondistended, nontender. . No appreciable masses or hepatomegaly. Abdominal scars noted Rectal:  Not performed.  Msk:  Symmetrical without gross deformities.  Extremities:  Without edema. Neurologic:  Alert and  oriented x4;  grossly normal neurologically. Skin:  Intact without significant lesions or rashes. Psych:  Alert and cooperative. Normal affect.  LAB RESULTS: Recent Labs    04/18/18 1806  WBC 11.3*  HGB 14.1  HCT 44.8  PLT 240   BMET Recent Labs    04/18/18 1806  NA 139  K 3.6  CL 104  CO2 22  GLUCOSE 95  BUN 11  CREATININE 1.18  CALCIUM 9.4   LFT Recent Labs    04/18/18 1806  PROT 7.4  ALBUMIN 4.6  AST 21  ALT 37  ALKPHOS 67  BILITOT 0.6   PT/INR No results for input(s): LABPROT, INR in the last 72 hours.  STUDIES:     Impression / Plan:   37 y/o male with a history of foreign body ingestions. He had multiple interventions/laparotomies per his report including at least 5 or 6 evaluations of a small bowel requiring excisions and removals. He had a prolonged EGD yesterday for removal of sheet metal which was successful. He again ingested more sheet metal last night, another 4 pieces in hopes that someone will operate on his abdomen. I counseled him that swallowing metal will not lead to  an operation for removal of retained old metal in his abdominal wall a different site. I counseled him on the risks of bowel perforation and bleeding with foreign body ingestion. I'm recommending an EGD with anesthesia to remove the retained foreign bodies - he will be electively intubated for this. Use of over-tube was not successful yesterday as the objects are too large to fit through it. Significant risks for bleeding and perforation are possible with this exam. He also ate recently which make may this even more difficult. I spoke with  General surgery Dr. Rayburn Ma who is on call and aware of this case should removal not be successful and operative removal is needed. Patient verbalized understanding and agreed with the plan.  Ileene Patrick, MD [facility]

## 2018-04-20 NOTE — B[facility] Counselor (Signed)
Pt has new TTS order, pt's TTS assessment has been completed. Pt continue to meet inpatient criteria.   Redmond Pulling, MS, [facility], [facility] Triage Specialist [phone]

## 2018-04-20 NOTE — ED Notes (Signed)
Bed: WA29 Expected date:  Expected time:  Means of arrival:  Comments: Hold for 35

## 2018-04-21 DIAGNOSIS — F1414 Cocaine abuse with cocaine-induced mood disorder: Secondary | ICD-10-CM | POA: Diagnosis present

## 2018-04-21 NOTE — Consult Note (Addendum)
[facility] Psych ED Discharge  04/21/2018 1:10 PM MATTEW CHRISWELL  MRN:  [id number] Principal Problem: Cocaine abuse with cocaine-induced mood disorder [facility]) Discharge Diagnoses:  Patient Active Problem List   Diagnosis Date Noted  . Cocaine abuse with cocaine-induced mood disorder [facility]) [F14.14] 04/21/2018    Priority: High  . Cocaine abuse (HCC) [F14.10] 04/20/2018    Priority: High  . Suicide attempt (HCC) [T14.91XA]   . Swallowed foreign body [T18.9XXA]   . Serotonin syndrome [G25.79] 03/31/2018  . Intentional drug overdose (HCC) [T50.902A]     Subjective: 37 yo male who came to the ED after swallowing metal to get the facility to remove a nail and pen he placed in his laparoscopic old wound.  He is not suicidal/homicidal, hallucinating, or withdrawing.  Reports he purposefully swallowed metal only to get the nail out.  Unfortunately we cannot stop him from swallowing things but encouraged him to use his coping skills versus swallowing things.  Stable for discharge.  Total Time spent with patient: 45 minutes  Past Psychiatric History: cocaine abuse  Past Medical History:  Past Medical History:  Diagnosis Date  . Cocaine abuse [facility])    Past Surgical History:  Procedure Laterality Date  . ENTEROSCOPY N/A 04/09/2018   Procedure: ENTEROSCOPY;  Surgeon: Mansouraty, Netty Starring., MD;  Location: [facility] ENDOSCOPY;  Service: Gastroenterology;  Laterality: N/A;   Family History: History reviewed. No pertinent family history. Family Psychiatric  History: none Social History:  Social History   Substance and Sexual Activity  Alcohol Use Not Currently    Social History   Substance and Sexual Activity  Drug Use Yes  . Types: Cocaine   Social History   Socioeconomic History  . Marital status: Single    Spouse name: Not on file  . Number of children: Not on file  . Years of education: Not on file  . Highest education level: Not on file  Occupational History  . Not on file  Social Needs   . Financial resource strain: Not on file  . Food insecurity:    Worry: Not on file    Inability: Not on file  . Transportation needs:    Medical: Not on file    Non-medical: Not on file  Tobacco Use  . Smoking status: Current Some Day Smoker  . Smokeless tobacco: Never Used  Substance and Sexual Activity  . Alcohol use: Not Currently  . Drug use: Yes    Types: Cocaine  . Sexual activity: Not on file  Lifestyle  . Physical activity:    Days per week: Not on file    Minutes per session: Not on file  . Stress: Not on file  Relationships  . Social connections:    Talks on phone: Not on file    Gets together: Not on file    Attends religious service: Not on file    Active member of club or organization: Not on file    Attends meetings of clubs or organizations: Not on file    Relationship status: Not on file  Other Topics Concern  . Not on file  Social History Narrative  . Not on file    Has this patient used any form of tobacco in the last 30 days? (Cigarettes, Smokeless Tobacco, Cigars, and/or Pipes) Denies  Current Medications: Current Facility-Administered Medications  Medication Dose Route Frequency Provider Last Rate Last Dose  . acetaminophen (TYLENOL) tablet 1,000 mg  1,000 mg Oral Q6H PRN Curatolo, Adam, DO   1,000 mg  at 04/20/18 2252  . buPROPion (WELLBUTRIN XL) 24 hr tablet 150 mg  150 mg Oral Daily Curatolo, Adam, DO   150 mg at 04/21/18 1032  . gabapentin (NEURONTIN) capsule 300 mg  300 mg Oral TID Charm Rings, NP   300 mg at 04/21/18 1032  . hydrOXYzine (ATARAX/VISTARIL) tablet 25 mg  25 mg Oral TID PRN Virgina Norfolk, DO   25 mg at 04/20/18 2252  . ibuprofen (ADVIL,MOTRIN) tablet 800 mg  800 mg Oral Q6H PRN Charm Rings, NP   800 mg at 04/20/18 1212  . menthol-cetylpyridinium (CEPACOL) lozenge 3 mg  1 lozenge Oral PRN Terrilee Files, MD   3 mg at 04/20/18 0855  . mirtazapine (REMERON) tablet 7.5 mg  7.5 mg Oral QHS PRN Charm Rings, NP   7.5 mg at  04/20/18 2252  . nicotine (NICODERM CQ - dosed in mg/24 hours) patch 21 mg  21 mg Transdermal Daily Terrilee Files, MD   21 mg at 04/20/18 0900  . pantoprazole (PROTONIX) EC tablet 20 mg  20 mg Oral Daily Curatolo, Adam, DO   20 mg at 04/21/18 1032   Current Outpatient Medications  Medication Sig Dispense Refill  . buPROPion (WELLBUTRIN XL) 150 MG 24 hr tablet Take 150 mg by mouth daily.    . hydrOXYzine (ATARAX/VISTARIL) 25 MG tablet Take 25 mg by mouth 3 (three) times daily as needed for anxiety.    . mirtazapine (REMERON) 15 MG tablet Take 7.5-15 mg by mouth See admin instructions. Taking 1/2 (7.5 mg) to one tablet (15mg ) as needed at bedtime for sleep    . pantoprazole (PROTONIX) 20 MG tablet Take 1 tablet (20 mg total) by mouth daily. 30 tablet 0  . acetaminophen (TYLENOL) 500 MG tablet Take 1,000 mg by mouth every 6 (six) hours as needed for mild pain.    . cyanocobalamin (,VITAMIN B-12,) 1000 MCG/ML injection Inject 1,000 mcg into the muscle every 30 (thirty) days.     PTA Medications:  (Not in a hospital admission)  Musculoskeletal: Strength & Muscle Tone: within normal limits Gait & Station: normal Patient leans: N/A  Psychiatric Specialty Exam: Physical Exam  Nursing note and vitals reviewed. Constitutional: He is oriented to person, place, and time. He appears well-developed and well-nourished.  HENT:  Head: Normocephalic.  Neck: Normal range of motion.  Respiratory: Effort normal.  Musculoskeletal: Normal range of motion.  Neurological: He is alert and oriented to person, place, and time.  Psychiatric: His speech is normal and behavior is normal. Thought content normal. His affect is blunt. Cognition and memory are normal. He expresses impulsivity.    Review of Systems  All other systems reviewed and are negative.   Blood pressure 120/69, pulse 65, temperature 98.1 F (36.7 C), temperature source Oral, resp. rate 14, SpO2 99 %.There is no height or weight on file  to calculate BMI.  General Appearance: Casual  Eye Contact:  Good  Speech:  Normal Rate  Volume:  Normal  Mood:  Euthymic  Affect:  Blunt  Thought Process:  Coherent and Descriptions of Associations: Intact  Orientation:  Full (Time, Place, and Person)  Thought Content:  WDL and Logical  Suicidal Thoughts:  No  Homicidal Thoughts:  No  Memory:  Immediate;   Good Recent;   Good Remote;   Good  Judgement:  Fair  Insight:  Good  Psychomotor Activity:  Normal  Concentration:  Concentration: Good and Attention Span: Good  Recall:  Good  Fund of Knowledge:  Good  Language:  Good  Akathisia:  No  Handed:  Right  AIMS (if indicated):     Assets:  Leisure Time Physical Health Resilience Social Support  ADL's:  Intact  Cognition:  WNL  Sleep:      Demographic Factors:  Male  Loss Factors: NA  Historical Factors: Impulsivity  Risk Reduction Factors:   Sense of responsibility to family, Positive social support and Positive therapeutic relationship  Continued Clinical Symptoms:  none  Cognitive Features That Contribute To Risk:  None    Suicide Risk:  Minimal: No identifiable suicidal ideation.  Patients presenting with no risk factors but with morbid ruminations; may be classified as minimal risk based on the severity of the depressive symptoms  Follow-up Information    Ovidio Kin, MD. Schedule an appointment as soon as possible for a visit in 1 week.   Specialty:  General Surgery Why:  To follow up foreign bodies. Contact information: [address] [place] [address] [phone]           Plan Of Care/Follow-up recommendations:  Cocaine abuse with cocaine induced mood disorder: -Continue Wellbutrin 150 mg daily for depression -Continue REmeron 7.5 mg at bedtime for sleep -Continue hydroxyzine 25 mg TID PRN anxiety  Disposition: discharge home [name], NP 04/21/2018, 1:10 PM   Patient has been evaluated by this MD,  He has not  endorses safety concerns and also indicate his behavior or swallowing metal objects to get what he wants and seems to be manipulative behavors and smiling most of the evaluation time. Note has been reviewed and I personally elaborated treatment  plan and recommendations.  Leata Mouse, MD 04/21/2018

## 2018-04-21 NOTE — Progress Notes (Signed)
CSW aware patient chart indicates a legal guardian. CSW attempted to reach [facility] [phone], left voicemail with call back number.  Enid Cutter, LCSW-A Clinical Social Worker [phone]

## 2018-04-21 NOTE — Anesthesia Postprocedure Evaluation (Signed)
Anesthesia Post Note  Patient: David Mcconnell  Procedure(s) Performed: ESOPHAGOGASTRODUODENOSCOPY (EGD) WITH PROPOFOL (N/A ) FOREIGN BODY REMOVAL     Patient location during evaluation: PACU Anesthesia Type: General Level of consciousness: awake and alert Pain management: pain level controlled Vital Signs Assessment: post-procedure vital signs reviewed and stable Respiratory status: spontaneous breathing, nonlabored ventilation and respiratory function stable Cardiovascular status: blood pressure returned to baseline and stable Postop Assessment: no apparent nausea or vomiting Anesthetic complications: no    Last Vitals:  Vitals:   04/20/18 2105 04/21/18 0633  BP: (!) 163/98 120/69  Pulse: 87 65  Resp: 14 14  Temp: 37.3 C 36.7 C  SpO2: 99% 99%    Last Pain:  Vitals:   04/21/18 0941  TempSrc:   PainSc: 5                  Beryle Lathe

## 2018-04-21 NOTE — ED Notes (Signed)
Pt discharged safely with discharge instructions.  All belongings were returned. 

## 2018-04-21 NOTE — ED Notes (Signed)
1-1 sitter remains at bedside.  Pt laying on mattress on floor for pt safety.  No distress noted, remains calm & cooperative at present.

## 2018-04-22 ENCOUNTER — Encounter (HOSPITAL_COMMUNITY): Payer: Self-pay | Admitting: Gastroenterology

## 2018-06-13 ENCOUNTER — Emergency Department (HOSPITAL_COMMUNITY): Payer: Medicaid Other

## 2018-06-13 ENCOUNTER — Ambulatory Visit: Payer: Self-pay | Admitting: Surgery

## 2018-06-13 ENCOUNTER — Other Ambulatory Visit: Payer: Self-pay

## 2018-06-13 ENCOUNTER — Encounter (HOSPITAL_COMMUNITY): Payer: Self-pay | Admitting: Surgery

## 2018-06-13 ENCOUNTER — Inpatient Hospital Stay (HOSPITAL_COMMUNITY)
Admission: EM | Admit: 2018-06-13 | Discharge: 2018-07-08 | DRG: 604 | Disposition: A | Discharge status: Home / Self Care | Payer: Medicaid Other | Attending: General Surgery | Admitting: General Surgery

## 2018-06-13 DIAGNOSIS — F603 Borderline personality disorder: Secondary | ICD-10-CM | POA: Diagnosis present

## 2018-06-13 DIAGNOSIS — D62 Acute posthemorrhagic anemia: Secondary | ICD-10-CM | POA: Diagnosis not present

## 2018-06-13 DIAGNOSIS — Z9114 Patient's other noncompliance with medication regimen: Secondary | ICD-10-CM | POA: Diagnosis not present

## 2018-06-13 DIAGNOSIS — F1410 Cocaine abuse, uncomplicated: Secondary | ICD-10-CM | POA: Diagnosis present

## 2018-06-13 DIAGNOSIS — R740 Nonspecific elevation of levels of transaminase and lactic acid dehydrogenase [LDH]: Secondary | ICD-10-CM | POA: Diagnosis not present

## 2018-06-13 DIAGNOSIS — S31119A Laceration without foreign body of abdominal wall, unspecified quadrant without penetration into peritoneal cavity, initial encounter: Secondary | ICD-10-CM

## 2018-06-13 DIAGNOSIS — Z452 Encounter for adjustment and management of vascular access device: Secondary | ICD-10-CM

## 2018-06-13 DIAGNOSIS — B377 Candidal sepsis: Secondary | ICD-10-CM | POA: Diagnosis not present

## 2018-06-13 DIAGNOSIS — F17210 Nicotine dependence, cigarettes, uncomplicated: Secondary | ICD-10-CM | POA: Diagnosis present

## 2018-06-13 DIAGNOSIS — Z915 Personal history of self-harm: Secondary | ICD-10-CM

## 2018-06-13 DIAGNOSIS — Z008 Encounter for other general examination: Secondary | ICD-10-CM

## 2018-06-13 DIAGNOSIS — Z1611 Resistance to penicillins: Secondary | ICD-10-CM | POA: Diagnosis present

## 2018-06-13 DIAGNOSIS — IMO0002 Reserved for concepts with insufficient information to code with codable children: Secondary | ICD-10-CM

## 2018-06-13 DIAGNOSIS — N179 Acute kidney failure, unspecified: Secondary | ICD-10-CM | POA: Diagnosis not present

## 2018-06-13 DIAGNOSIS — M62838 Other muscle spasm: Secondary | ICD-10-CM | POA: Diagnosis not present

## 2018-06-13 DIAGNOSIS — E872 Acidosis: Secondary | ICD-10-CM | POA: Diagnosis not present

## 2018-06-13 DIAGNOSIS — K920 Hematemesis: Secondary | ICD-10-CM | POA: Diagnosis not present

## 2018-06-13 DIAGNOSIS — R7881 Bacteremia: Secondary | ICD-10-CM

## 2018-06-13 DIAGNOSIS — S31115A Laceration without foreign body of abdominal wall, periumbilic region without penetration into peritoneal cavity, initial encounter: Principal | ICD-10-CM | POA: Diagnosis present

## 2018-06-13 DIAGNOSIS — F329 Major depressive disorder, single episode, unspecified: Secondary | ICD-10-CM | POA: Diagnosis present

## 2018-06-13 DIAGNOSIS — M542 Cervicalgia: Secondary | ICD-10-CM | POA: Diagnosis not present

## 2018-06-13 DIAGNOSIS — B49 Unspecified mycosis: Secondary | ICD-10-CM

## 2018-06-13 DIAGNOSIS — X781XXA Intentional self-harm by knife, initial encounter: Secondary | ICD-10-CM | POA: Diagnosis present

## 2018-06-13 DIAGNOSIS — Y92810 Car as the place of occurrence of the external cause: Secondary | ICD-10-CM

## 2018-06-13 DIAGNOSIS — Z7289 Other problems related to lifestyle: Secondary | ICD-10-CM

## 2018-06-13 DIAGNOSIS — R6521 Severe sepsis with septic shock: Secondary | ICD-10-CM | POA: Diagnosis not present

## 2018-06-13 DIAGNOSIS — F32A Depression, unspecified: Secondary | ICD-10-CM | POA: Diagnosis present

## 2018-06-13 DIAGNOSIS — F419 Anxiety disorder, unspecified: Secondary | ICD-10-CM | POA: Diagnosis present

## 2018-06-13 DIAGNOSIS — K317 Polyp of stomach and duodenum: Secondary | ICD-10-CM | POA: Diagnosis present

## 2018-06-13 DIAGNOSIS — Z888 Allergy status to other drugs, medicaments and biological substances status: Secondary | ICD-10-CM

## 2018-06-13 DIAGNOSIS — A419 Sepsis, unspecified organism: Secondary | ICD-10-CM

## 2018-06-13 DIAGNOSIS — R339 Retention of urine, unspecified: Secondary | ICD-10-CM | POA: Diagnosis not present

## 2018-06-13 DIAGNOSIS — Z79899 Other long term (current) drug therapy: Secondary | ICD-10-CM | POA: Diagnosis not present

## 2018-06-13 DIAGNOSIS — B952 Enterococcus as the cause of diseases classified elsewhere: Secondary | ICD-10-CM | POA: Diagnosis not present

## 2018-06-13 DIAGNOSIS — T148XXA Other injury of unspecified body region, initial encounter: Secondary | ICD-10-CM | POA: Diagnosis present

## 2018-06-13 DIAGNOSIS — A4181 Sepsis due to Enterococcus: Secondary | ICD-10-CM | POA: Diagnosis not present

## 2018-06-13 DIAGNOSIS — F1210 Cannabis abuse, uncomplicated: Secondary | ICD-10-CM | POA: Diagnosis present

## 2018-06-13 DIAGNOSIS — H3562 Retinal hemorrhage, left eye: Secondary | ICD-10-CM | POA: Diagnosis present

## 2018-06-13 DIAGNOSIS — K921 Melena: Secondary | ICD-10-CM | POA: Diagnosis not present

## 2018-06-13 DIAGNOSIS — K209 Esophagitis, unspecified: Secondary | ICD-10-CM | POA: Diagnosis present

## 2018-06-13 DIAGNOSIS — R001 Bradycardia, unspecified: Secondary | ICD-10-CM | POA: Diagnosis present

## 2018-06-13 HISTORY — DX: Major depressive disorder, single episode, unspecified: F32.9

## 2018-06-13 HISTORY — DX: Personal history of other medical treatment: Z92.89

## 2018-06-13 HISTORY — DX: Anxiety disorder, unspecified: F41.9

## 2018-06-13 HISTORY — DX: Borderline personality disorder: F60.3

## 2018-06-13 HISTORY — DX: Personal history of retained foreign body fully removed: Z87.821

## 2018-06-13 HISTORY — DX: Depression, unspecified: F32.A

## 2018-06-13 HISTORY — DX: Laceration without foreign body of abdominal wall, unspecified quadrant without penetration into peritoneal cavity, initial encounter: S31.119A

## 2018-06-13 HISTORY — DX: Anemia, unspecified: D64.9

## 2018-06-13 LAB — I-STAT CHEM 8, ED
BUN: 9 mg/dL (ref 6–20)
CHLORIDE: 102 mmol/L (ref 98–111)
Calcium, Ion: 1.13 mmol/L — ABNORMAL LOW (ref 1.15–1.40)
Creatinine, Ser: 1.1 mg/dL (ref 0.61–1.24)
Glucose, Bld: 99 mg/dL (ref 70–99)
HCT: 48 % (ref 39.0–52.0)
Hemoglobin: 16.3 g/dL (ref 13.0–17.0)
POTASSIUM: 3.9 mmol/L (ref 3.5–5.1)
Sodium: 140 mmol/L (ref 135–145)
TCO2: 25 mmol/L (ref 22–32)

## 2018-06-13 LAB — CBC
HCT: 46.7 % (ref 39.0–52.0)
Hemoglobin: 15.5 g/dL (ref 13.0–17.0)
MCH: 28.7 pg (ref 26.0–34.0)
MCHC: 33.2 g/dL (ref 30.0–36.0)
MCV: 86.3 fL (ref 80.0–100.0)
Platelets: 239 10*3/uL (ref 150–400)
RBC: 5.41 MIL/uL (ref 4.22–5.81)
RDW: 14.4 % (ref 11.5–15.5)
WBC: 12 10*3/uL — ABNORMAL HIGH (ref 4.0–10.5)
nRBC: 0 % (ref 0.0–0.2)

## 2018-06-13 LAB — URINALYSIS, ROUTINE W REFLEX MICROSCOPIC
Bacteria, UA: NONE SEEN
Bilirubin Urine: NEGATIVE
Glucose, UA: NEGATIVE mg/dL
Ketones, ur: NEGATIVE mg/dL
LEUKOCYTES UA: NEGATIVE
Nitrite: NEGATIVE
Protein, ur: NEGATIVE mg/dL
Specific Gravity, Urine: 1.011 (ref 1.005–1.030)
pH: 6 (ref 5.0–8.0)

## 2018-06-13 LAB — PREPARE FRESH FROZEN PLASMA
UNIT DIVISION: 0
Unit division: 0

## 2018-06-13 LAB — BPAM FFP
Blood Product Expiration Date: 202001092359
Blood Product Expiration Date: 202001102359
ISSUE DATE / TIME: 201912260504
ISSUE DATE / TIME: 201912260504
Unit Type and Rh: 6200
Unit Type and Rh: 6200

## 2018-06-13 LAB — COMPREHENSIVE METABOLIC PANEL
ALK PHOS: 73 U/L (ref 38–126)
ALT: 21 U/L (ref 0–44)
AST: 24 U/L (ref 15–41)
Albumin: 4.5 g/dL (ref 3.5–5.0)
Anion gap: 13 (ref 5–15)
BUN: 7 mg/dL (ref 6–20)
CO2: 26 mmol/L (ref 22–32)
CREATININE: 1.21 mg/dL (ref 0.61–1.24)
Calcium: 9.8 mg/dL (ref 8.9–10.3)
Chloride: 100 mmol/L (ref 98–111)
GFR calc Af Amer: >60 mL/min (ref >60)
GFR calc non Af Amer: >60 mL/min (ref >60)
Glucose, Bld: 101 mg/dL — ABNORMAL HIGH (ref 70–99)
Potassium: 3.7 mmol/L (ref 3.5–5.1)
Sodium: 139 mmol/L (ref 135–145)
Total Bilirubin: 1.3 mg/dL — ABNORMAL HIGH (ref 0.3–1.2)
Total Protein: 7.4 g/dL (ref 6.5–8.1)

## 2018-06-13 LAB — BLOOD PRODUCT ORDER (VERBAL) VERIFICATION

## 2018-06-13 LAB — PROTIME-INR
INR: 0.97
Prothrombin Time: 12.8 seconds (ref 11.4–15.2)

## 2018-06-13 LAB — CDS SEROLOGY

## 2018-06-13 LAB — I-STAT CG4 LACTIC ACID, ED: Lactic Acid, Venous: 1.85 mmol/L (ref 0.5–1.9)

## 2018-06-13 LAB — ETHANOL: Alcohol, Ethyl (B): <10 mg/dL (ref <10)

## 2018-06-13 LAB — ABO/RH: ABO/RH(D): O POS

## 2018-06-13 MED ORDER — BUPROPION HCL ER (XL) 150 MG PO TB24
150.0000 mg | ORAL_TABLET | Freq: Every day | ORAL | Status: DC
  Administered 2018-06-13 – 2018-07-08 (×25): 150 mg via ORAL
  Filled 2018-06-13 (×26): qty 1

## 2018-06-13 MED ORDER — METRONIDAZOLE IN NACL 5-0.79 MG/ML-% IV SOLN
500.0000 mg | Freq: Four times a day (QID) | INTRAVENOUS | Status: DC
  Administered 2018-06-13 – 2018-06-19 (×25): 500 mg via INTRAVENOUS
  Filled 2018-06-13 (×26): qty 100

## 2018-06-13 MED ORDER — HYDROCORTISONE 2.5 % RE CREA
1.0000 "application " | TOPICAL_CREAM | Freq: Four times a day (QID) | RECTAL | Status: DC | PRN
  Administered 2018-07-05: 1 via TOPICAL
  Filled 2018-06-13: qty 28.35

## 2018-06-13 MED ORDER — SODIUM CHLORIDE 0.9 % IV SOLN
INTRAVENOUS | Status: DC
  Administered 2018-06-13 – 2018-06-16 (×7): via INTRAVENOUS

## 2018-06-13 MED ORDER — SODIUM CHLORIDE 0.9 % IV SOLN
8.0000 mg | Freq: Four times a day (QID) | INTRAVENOUS | Status: DC | PRN
  Administered 2018-06-15: 8 mg via INTRAVENOUS
  Filled 2018-06-13: qty 4

## 2018-06-13 MED ORDER — HYDROCORTISONE 1 % EX CREA
1.0000 "application " | TOPICAL_CREAM | Freq: Three times a day (TID) | CUTANEOUS | Status: DC | PRN
  Administered 2018-06-16: 1 via TOPICAL
  Filled 2018-06-13 (×2): qty 28

## 2018-06-13 MED ORDER — INFLUENZA VAC SPLIT QUAD 0.5 ML IM SUSY: 0.5000 mL | PREFILLED_SYRINGE | INTRAMUSCULAR | Status: DC

## 2018-06-13 MED ORDER — LIP MEDEX EX OINT
1.0000 "application " | TOPICAL_OINTMENT | Freq: Two times a day (BID) | CUTANEOUS | Status: DC
  Administered 2018-06-14 – 2018-07-08 (×41): 1 via TOPICAL
  Filled 2018-06-13 (×4): qty 7

## 2018-06-13 MED ORDER — DIPHENHYDRAMINE HCL 50 MG/ML IJ SOLN
12.5000 mg | Freq: Four times a day (QID) | INTRAMUSCULAR | Status: DC | PRN
  Administered 2018-06-13 – 2018-06-17 (×11): 25 mg via INTRAVENOUS
  Administered 2018-06-18 (×2): 12.5 mg via INTRAVENOUS
  Administered 2018-06-18: 25 mg via INTRAVENOUS
  Administered 2018-06-18: 12.5 mg via INTRAVENOUS
  Administered 2018-06-19 – 2018-06-23 (×3): 25 mg via INTRAVENOUS
  Filled 2018-06-13 (×8): qty 1
  Filled 2018-06-13: qty 0.5
  Filled 2018-06-13 (×10): qty 1

## 2018-06-13 MED ORDER — SODIUM CHLORIDE 0.9 % IV SOLN
2.0000 g | Freq: Every day | INTRAVENOUS | Status: DC
  Administered 2018-06-13 – 2018-06-18 (×6): 2 g via INTRAVENOUS
  Filled 2018-06-13 (×7): qty 20

## 2018-06-13 MED ORDER — ENOXAPARIN SODIUM 40 MG/0.4ML ~~LOC~~ SOLN
40.0000 mg | Freq: Every day | SUBCUTANEOUS | Status: DC
  Administered 2018-06-13 – 2018-06-14 (×2): 40 mg via SUBCUTANEOUS
  Filled 2018-06-13 (×2): qty 0.4

## 2018-06-13 MED ORDER — SODIUM CHLORIDE 0.9 % IV BOLUS
1000.0000 mL | Freq: Once | INTRAVENOUS | Status: AC
  Administered 2018-06-13: 1000 mL via INTRAVENOUS

## 2018-06-13 MED ORDER — ONDANSETRON HCL 4 MG/2ML IJ SOLN
4.0000 mg | Freq: Four times a day (QID) | INTRAMUSCULAR | Status: DC | PRN
  Administered 2018-06-14 – 2018-06-27 (×11): 4 mg via INTRAVENOUS
  Filled 2018-06-13 (×13): qty 2

## 2018-06-13 MED ORDER — ACETAMINOPHEN 650 MG RE SUPP
650.0000 mg | Freq: Four times a day (QID) | RECTAL | Status: DC | PRN
  Filled 2018-06-13: qty 1

## 2018-06-13 MED ORDER — CEFAZOLIN SODIUM-DEXTROSE 1-4 GM/50ML-% IV SOLN
1.0000 g | Freq: Once | INTRAVENOUS | Status: AC
  Administered 2018-06-13: 1 g via INTRAVENOUS
  Filled 2018-06-13: qty 50

## 2018-06-13 MED ORDER — NICOTINE 14 MG/24HR TD PT24
14.0000 mg | MEDICATED_PATCH | Freq: Every day | TRANSDERMAL | Status: DC
  Administered 2018-06-13: 14 mg via TRANSDERMAL
  Filled 2018-06-13: qty 1

## 2018-06-13 MED ORDER — ALUM & MAG HYDROXIDE-SIMETH 200-200-20 MG/5ML PO SUSP
30.0000 mL | Freq: Four times a day (QID) | ORAL | Status: DC | PRN
  Administered 2018-06-19 – 2018-06-21 (×2): 30 mL via ORAL
  Filled 2018-06-13 (×2): qty 30

## 2018-06-13 MED ORDER — METHOCARBAMOL 1000 MG/10ML IJ SOLN
1000.0000 mg | Freq: Four times a day (QID) | INTRAVENOUS | Status: DC | PRN
  Administered 2018-06-14: 1000 mg via INTRAVENOUS
  Filled 2018-06-13 (×2): qty 10

## 2018-06-13 MED ORDER — IOHEXOL 300 MG/ML  SOLN
100.0000 mL | Freq: Once | INTRAMUSCULAR | Status: AC | PRN
  Administered 2018-06-13: 100 mL via INTRAVENOUS

## 2018-06-13 MED ORDER — LACTATED RINGERS IV SOLN
INTRAVENOUS | Status: DC
  Administered 2018-06-13: 08:00:00 via INTRAVENOUS

## 2018-06-13 MED ORDER — BISACODYL 10 MG RE SUPP: 10.0000 mg | Freq: Two times a day (BID) | RECTAL | Status: DC | PRN

## 2018-06-13 MED ORDER — NICOTINE 21 MG/24HR TD PT24
21.0000 mg | MEDICATED_PATCH | Freq: Every day | TRANSDERMAL | Status: DC
  Administered 2018-06-14 – 2018-07-04 (×20): 21 mg via TRANSDERMAL
  Filled 2018-06-13 (×22): qty 1

## 2018-06-13 MED ORDER — GUAIFENESIN-DM 100-10 MG/5ML PO SYRP
10.0000 mL | ORAL_SOLUTION | ORAL | Status: DC | PRN
  Filled 2018-06-13: qty 10

## 2018-06-13 MED ORDER — HYDROMORPHONE HCL 1 MG/ML IJ SOLN
0.5000 mg | INTRAMUSCULAR | Status: DC | PRN
  Administered 2018-06-13 – 2018-06-16 (×24): 1 mg via INTRAVENOUS
  Administered 2018-06-16: 2 mg via INTRAVENOUS
  Administered 2018-06-16 (×3): 1 mg via INTRAVENOUS
  Administered 2018-06-16: 2 mg via INTRAVENOUS
  Administered 2018-06-17 (×3): 1 mg via INTRAVENOUS
  Filled 2018-06-13 (×2): qty 1
  Filled 2018-06-13: qty 2
  Filled 2018-06-13 (×4): qty 1
  Filled 2018-06-13: qty 2
  Filled 2018-06-13 (×2): qty 1
  Filled 2018-06-13: qty 2
  Filled 2018-06-13 (×21): qty 1

## 2018-06-13 MED ORDER — LACTATED RINGERS IV BOLUS
1000.0000 mL | Freq: Once | INTRAVENOUS | Status: AC
  Administered 2018-06-13: 1000 mL via INTRAVENOUS

## 2018-06-13 MED ORDER — PHENOL 1.4 % MT LIQD: 1.0000 | OROMUCOSAL | Status: DC | PRN

## 2018-06-13 MED ORDER — PNEUMOCOCCAL VAC POLYVALENT 25 MCG/0.5ML IJ INJ: 0.5000 mL | INJECTION | INTRAMUSCULAR | Status: DC

## 2018-06-13 MED ORDER — MENTHOL 3 MG MT LOZG: 1.0000 | LOZENGE | OROMUCOSAL | Status: DC | PRN

## 2018-06-13 MED ORDER — SODIUM CHLORIDE 0.9 % IV BOLUS
1000.0000 mL | Freq: Three times a day (TID) | INTRAVENOUS | Status: DC | PRN
  Administered 2018-06-14 – 2018-06-15 (×2): 1000 mL via INTRAVENOUS

## 2018-06-13 MED ORDER — ALUM & MAG HYDROXIDE-SIMETH 200-200-20 MG/5ML PO SUSP: 30.0000 mL | Freq: Four times a day (QID) | ORAL | Status: DC | PRN

## 2018-06-13 MED ORDER — PROCHLORPERAZINE EDISYLATE 10 MG/2ML IJ SOLN
5.0000 mg | INTRAMUSCULAR | Status: DC | PRN
  Administered 2018-06-14: 10 mg via INTRAVENOUS
  Filled 2018-06-13: qty 2
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
## 2018-06-13 NOTE — Progress Notes (Signed)
2nd shift ED CSW received a handoff from the 1st shift [facility] CSW.  Pt recommended for inpatient psych stay by the provider, per 1st shift CSW.  2nd shift ED CSW contacted [facility] who stated pt will be under review for possible admission at this time and will call the CSW at [phone] on 12/26 with any updates regarding possible admission.  CSW will continue to follow for D/C needs.  Dorothe PeaJonathan F. Erricka Falkner, LCSW, LCAS, CSI Clinical Social Worker Ph: [phone]

## 2018-06-14 LAB — CBC
HCT: 42.2 % (ref 39.0–52.0)
HEMATOCRIT: 26.3 % — AB (ref 39.0–52.0)
HEMOGLOBIN: 8.2 g/dL — AB (ref 13.0–17.0)
Hemoglobin: 13 g/dL (ref 13.0–17.0)
MCH: 27.9 pg (ref 26.0–34.0)
MCH: 27.9 pg (ref 26.0–34.0)
MCHC: 30.8 g/dL (ref 30.0–36.0)
MCHC: 31.2 g/dL (ref 30.0–36.0)
MCV: 90.6 fL (ref 80.0–100.0)
MCV: 89.5 fL (ref 80.0–100.0)
Platelets: 159 10*3/uL (ref 150–400)
Platelets: 178 10*3/uL (ref 150–400)
RBC: 4.66 MIL/uL (ref 4.22–5.81)
RBC: 2.94 MIL/uL — AB (ref 4.22–5.81)
RDW: 14.6 % (ref 11.5–15.5)
RDW: 14 % (ref 11.5–15.5)
WBC: 7.3 10*3/uL (ref 4.0–10.5)
WBC: 12.4 10*3/uL — ABNORMAL HIGH (ref 4.0–10.5)
nRBC: 0 % (ref 0.0–0.2)
nRBC: 0 % (ref 0.0–0.2)

## 2018-06-14 LAB — PREPARE RBC (CROSSMATCH)

## 2018-06-14 LAB — BASIC METABOLIC PANEL
ANION GAP: 11 (ref 5–15)
BUN: 7 mg/dL (ref 6–20)
CO2: 26 mmol/L (ref 22–32)
Calcium: 8.8 mg/dL — ABNORMAL LOW (ref 8.9–10.3)
Chloride: 103 mmol/L (ref 98–111)
Creatinine, Ser: 1.25 mg/dL — ABNORMAL HIGH (ref 0.61–1.24)
GFR calc Af Amer: >60 mL/min (ref >60)
GFR calc non Af Amer: >60 mL/min (ref >60)
GLUCOSE: 69 mg/dL — AB (ref 70–99)
Potassium: 3.9 mmol/L (ref 3.5–5.1)
Sodium: 140 mmol/L (ref 135–145)

## 2018-06-14 LAB — MRSA PCR SCREENING: MRSA by PCR: NEGATIVE

## 2018-06-14 LAB — HIV ANTIBODY (ROUTINE TESTING W REFLEX): HIV Screen 4th Generation wRfx: NONREACTIVE

## 2018-06-14 MED ORDER — SODIUM CHLORIDE 0.9 % IV SOLN
80.0000 mg | Freq: Once | INTRAVENOUS | Status: AC
  Administered 2018-06-15: 80 mg via INTRAVENOUS
  Filled 2018-06-14: qty 80

## 2018-06-14 MED ORDER — PANTOPRAZOLE SODIUM 40 MG IV SOLR: 40.0000 mg | Freq: Two times a day (BID) | INTRAVENOUS | Status: DC

## 2018-06-14 MED ORDER — SODIUM CHLORIDE 0.9% IV SOLUTION
Freq: Once | INTRAVENOUS | Status: AC
  Administered 2018-06-14: 23:00:00 via INTRAVENOUS

## 2018-06-14 MED ORDER — SODIUM CHLORIDE 0.9 % IV SOLN
8.0000 mg/h | INTRAVENOUS | Status: AC
  Administered 2018-06-15 – 2018-06-17 (×4): 8 mg/h via INTRAVENOUS
  Filled 2018-06-14 (×9): qty 80

## 2018-06-14 NOTE — Clinical Social Work Note (Signed)
Clinical Social Worker continuing to pursue inpatient psych placement, however [facility] and no return call from [facility] at this time.  CSW remains available for support and to facilitate patient discharge once medically stable and bed available.  Macario GoldsJesse Shawnda Mauney, [place] [phone]

## 2018-06-14 NOTE — Progress Notes (Signed)
2030= Pt vomited approx 250ml (bloody), and  About 700-81900ml of bloody stool. BP 89/41 P61, feels dizzy rt now. Will give NS bolus. Dr Corliss SkainsSuei notified and ordered Stat, CBC, Type and screen and to transfer pt to ICU.
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
## 2018-06-14 NOTE — Progress Notes (Signed)
I was called for assistance with transferring [name] to ICU.  He had approximately 250 cc of bloody emesis with a large dark bloody stool. Stat CBC pending.  Vital signs HR 69, bp 89/41 (55), RR 17 with sats 99% on RA.  NS 1L bolus infusing.  Orders received from Dr. Corliss Skainssuei prior to my arrival.  No acute interventions by myself.  Awaiting 4N bed assignment.  Pt resting. No c/o abdominal pain/cramping at this time.

## 2018-06-15 ENCOUNTER — Encounter (HOSPITAL_COMMUNITY): Admission: EM | Disposition: A | Discharge status: Home / Self Care | Payer: Self-pay | Source: Home / Self Care

## 2018-06-15 ENCOUNTER — Inpatient Hospital Stay (HOSPITAL_COMMUNITY): Payer: Medicaid Other | Admitting: Certified Registered"

## 2018-06-15 ENCOUNTER — Encounter (HOSPITAL_COMMUNITY): Payer: Self-pay | Admitting: *Deleted

## 2018-06-15 HISTORY — PX: ESOPHAGOGASTRODUODENOSCOPY (EGD) WITH PROPOFOL: SHX5813

## 2018-06-15 HISTORY — PX: BIOPSY: SHX5522

## 2018-06-15 LAB — CBC
HCT: 25.1 % — ABNORMAL LOW (ref 39.0–52.0)
Hemoglobin: 8.1 g/dL — ABNORMAL LOW (ref 13.0–17.0)
MCH: 28.9 pg (ref 26.0–34.0)
MCHC: 32.3 g/dL (ref 30.0–36.0)
MCV: 89.6 fL (ref 80.0–100.0)
Platelets: 150 10*3/uL (ref 150–400)
RBC: 2.8 MIL/uL — ABNORMAL LOW (ref 4.22–5.81)
RDW: 14 % (ref 11.5–15.5)
WBC: 9.7 10*3/uL (ref 4.0–10.5)
nRBC: 0 % (ref 0.0–0.2)

## 2018-06-15 LAB — SURGICAL PCR SCREEN
MRSA, PCR: NEGATIVE
Staphylococcus aureus: NEGATIVE

## 2018-06-15 LAB — HEMOGLOBIN AND HEMATOCRIT, BLOOD
HEMATOCRIT: 27.3 % — AB (ref 39.0–52.0)
HEMOGLOBIN: 8.8 g/dL — AB (ref 13.0–17.0)

## 2018-06-15 SURGERY — ESOPHAGOGASTRODUODENOSCOPY (EGD) WITH PROPOFOL
Anesthesia: Monitor Anesthesia Care

## 2018-06-15 MED ORDER — SODIUM CHLORIDE 0.9% IV SOLUTION: Freq: Once | INTRAVENOUS | Status: DC

## 2018-06-15 MED ORDER — BUTAMBEN-TETRACAINE-BENZOCAINE 2-2-14 % EX AERO
INHALATION_SPRAY | CUTANEOUS | Status: DC | PRN
  Administered 2018-06-15: 2 via TOPICAL

## 2018-06-15 MED ORDER — PROPOFOL 10 MG/ML IV BOLUS
INTRAVENOUS | Status: DC | PRN
  Administered 2018-06-15: 10 mg via INTRAVENOUS
  Administered 2018-06-15: 20 mg via INTRAVENOUS
  Administered 2018-06-15: 10 mg via INTRAVENOUS
  Administered 2018-06-15: 20 mg via INTRAVENOUS

## 2018-06-15 MED ORDER — SODIUM CHLORIDE 0.9 % IV SOLN: INTRAVENOUS | Status: DC

## 2018-06-15 MED ORDER — LACTATED RINGERS IV SOLN
INTRAVENOUS | Status: DC
  Administered 2018-06-15: 1000 mL via INTRAVENOUS

## 2018-06-15 MED ORDER — PROPOFOL 500 MG/50ML IV EMUL
INTRAVENOUS | Status: DC | PRN
  Administered 2018-06-15: 200 ug/kg/min via INTRAVENOUS

## 2018-06-15 MED ORDER — SODIUM CHLORIDE 0.9 % IV SOLN: INTRAVENOUS | Status: DC | PRN

## 2018-06-15 NOTE — Progress Notes (Signed)
Blood initiated at 2230. Patient began having chills at 2245. Blood transfusion stopped and  Dr. Corliss Skainssuei notified at 2247. Dr Corliss Skainssuei advised to continue blood transfusion as the patient's hemoglobin dropped from 13 to 8. RN to follow orders and to continue to monitor.

## 2018-06-15 NOTE — Transfer of Care (Signed)
Immediate Anesthesia Transfer of Care Note  Patient: David Mcconnell  Procedure(s) Performed: ESOPHAGOGASTRODUODENOSCOPY (EGD) WITH PROPOFOL (N/A )  Patient Location: Endoscopy Unit  Anesthesia Type:MAC  Level of Consciousness: awake, alert  and oriented  Airway & Oxygen Therapy: Patient Spontanous Breathing and Patient connected to nasal cannula oxygen  Post-op Assessment: Report given to RN and Post -op Vital signs reviewed and stable  Post vital signs: Reviewed and stable  Last Vitals:  Vitals Value Taken Time  BP 89/50 06/15/2018 11:16 AM  Temp    Pulse 69 06/15/2018 11:16 AM  Resp 18 06/15/2018 11:16 AM  SpO2 100 % 06/15/2018 11:16 AM    Last Pain:  Vitals:   06/15/18 1116  TempSrc: Oral  PainSc: Asleep      Patients Stated Pain Goal: 0 (71/16/57 9038)  Complications: No apparent anesthesia complications

## 2018-06-15 NOTE — Op Note (Signed)
[facility] Patient Name: David Mcconnell Procedure Date : 06/15/2018 MRN: [id number] Attending MD: Juanita Craver , MD Date of Birth: [birth year] CSN: [id number] Age: 37 Admit Type: Inpatient Procedure:                EGD with biopsies. Indications:              Hematemesis; Acute post hemorrhagic anemia; Prior                            history of foreign body ingestion-metal                            strips/razoer blade.. Providers:                Juanita Craver, MD, Elmer Ramp. Tilden Dome, RN, Laverda Sorenson, Technician, Merrilyn Puma, CRNA Referring MD:             Storm Frisk, MD Medicines:                Monitored Anesthesia Care Complications:            No immediate complications. Estimated Blood Loss:     Estimated blood loss was minimal. Procedure:                Pre-Anesthesia Assessment: - Prior to the                            procedure, a history and physical was performed,                            and patient medications and allergies were                            reviewed. The patient's tolerance of previous                            anesthesia was also reviewed. The risks and                            benefits of the procedure and the sedation options                            and risks were discussed with the patient. All                            questions were answered, and informed consent was                            obtained. Prior Anticoagulants: The patient has                            taken no previous anticoagulant or antiplatelet  agents. ASA Grade Assessment: II - A patient with                            mild systemic disease. After reviewing the risks                            and benefits, the patient was deemed in                            satisfactory condition to undergo the procedure.                            After obtaining informed consent, the endoscope was         passed under direct vision. Throughout the                            procedure, the patient's blood pressure, pulse, and                            oxygen saturations were monitored continuously. The                            GIF-H190 ([id number]) Olympus Adult EGD was introduced                            through the mouth, and advanced to the second part                            of duodenum. The EGD was accomplished without                            difficulty. The patient tolerated the procedure                            well. Scope In: Scope Out: Findings:      Except for mild LA Grade B esophagitis in the distal esophagus, the       examined esophagus appeared widely patent and normal; mucosal nodularity       was noyted at the Z-line and this was biopsied.      A few 6-7 mm sessile polyps with no stigmata of recent bleeding were       found in the gastric body.      The cardia and gastric fundus were normal on retroflexion.      The examined duodenum was normal; there was no fresh or old heme noted       in the upper GI tract. Impression:               - Mild LA Grade B esophagitis in the distal                            esophagus; otherwise normal appearing, widely                            patent esophagus and  GEJ; mucosal nodularity at the                            Z-line-biopsies done.                           - A few gastric polyps in the body of the stomach;                            normall apppearing stomach on retroflexion.                           - Normal examined duodenum; no fresh or old heme                            was seen in the entire examined upper GI tract.                           - No specimens collected. Moderate Sedation:      MAC used. Recommendation:           - Clear liquid diet today.                           - Continue present medications.                           - Continue serial CBCs.                           - Await  pathology results. Procedure Code(s):        --- Professional ---                           [phone], Esophagogastroduodenoscopy, flexible,                            transoral; with biopsy, single or multiple Diagnosis Code(s):        --- Professional ---                           K92.0, Hematemesis                           K21.0, Gastro-esophageal reflux disease with                            esophagitis                           K31.7, Polyp of stomach and duodenum                           D62, Acute posthemorrhagic anemia CPT copyright 2018 American Medical Association. All rights reserved. The codes documented in this report are preliminary and upon coder review may  be revised to meet current compliance requirements. Juanita Craver, MD Juanita Craver, MD 06/15/2018 11:32:02 AM This report has been signed electronically.  Number of Addenda: 0 
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
## 2018-06-15 NOTE — Anesthesia Preprocedure Evaluation (Signed)
Anesthesia Evaluation  Patient identified by MRN, date of birth, ID band Patient awake    Reviewed: Allergy & Precautions, NPO status , Patient's Chart, lab work & pertinent test results  Airway Mallampati: II  TM Distance: >3 FB Neck ROM: Full    Dental  (+) Teeth Intact, Dental Advisory Given   Pulmonary Current Smoker,    breath sounds clear to auscultation       Cardiovascular negative cardio ROS   Rhythm:Regular Rate:Bradycardia     Neuro/Psych Anxiety Depression Bipolar Disorder    GI/Hepatic negative GI ROS, Neg liver ROS,   Endo/Other  negative endocrine ROS  Renal/GU negative Renal ROS     Musculoskeletal negative musculoskeletal ROS (+)   Abdominal Normal abdominal exam  (+)   Peds  Hematology negative hematology ROS (+)   Anesthesia Other Findings   Reproductive/Obstetrics                             Anesthesia Physical Anesthesia Plan  ASA: II  Anesthesia Plan: MAC   Post-op Pain Management:    Induction: Intravenous  PONV Risk Score and Plan: Propofol infusion and Ondansetron  Airway Management Planned: Natural Airway and Nasal Cannula  Additional Equipment: None  Intra-op Plan:   Post-operative Plan:   Informed Consent: I have reviewed the patients History and Physical, chart, labs and discussed the procedure including the risks, benefits and alternatives for the proposed anesthesia with the patient or authorized representative who has indicated his/her understanding and acceptance.   Dental advisory given  Plan Discussed with: CRNA  Anesthesia Plan Comments:         Anesthesia Quick Evaluation

## 2018-06-15 NOTE — Anesthesia Procedure Notes (Signed)
Procedure Name: MAC Date/Time: 06/15/2018 11:00 AM Performed by: Wilburn Cornelia, CRNA Pre-anesthesia Checklist: Patient identified, Emergency Drugs available, Suction available, Patient being monitored and Timeout performed Patient Re-evaluated:Patient Re-evaluated prior to induction Oxygen Delivery Method: Nasal cannula Placement Confirmation: positive ETCO2 and breath sounds checked- equal and bilateral Dental Injury: Teeth and Oropharynx as per pre-operative assessment
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
## 2018-06-15 NOTE — Anesthesia Postprocedure Evaluation (Signed)
Anesthesia Post Note  Patient: David Mcconnell  Procedure(s) Performed: ESOPHAGOGASTRODUODENOSCOPY (EGD) WITH PROPOFOL (N/A ) BIOPSY     Patient location during evaluation: PACU Anesthesia Type: MAC Level of consciousness: awake and alert Pain management: pain level controlled Vital Signs Assessment: post-procedure vital signs reviewed and stable Respiratory status: spontaneous breathing, nonlabored ventilation, respiratory function stable and patient connected to nasal cannula oxygen Cardiovascular status: stable and blood pressure returned to baseline Postop Assessment: no apparent nausea or vomiting Anesthetic complications: no    Last Vitals:  Vitals:   06/15/18 1300 06/15/18 1400  BP: 129/61 127/65  Pulse: 80 82  Resp: 20   Temp:    SpO2: 100% 100%    Last Pain:  Vitals:   06/15/18 1130  TempSrc:   PainSc: [name]

## 2018-06-15 NOTE — Progress Notes (Signed)
Post PRBC transfusion of 8.8 hemoglobin and 27.3 hematocrit called in to Dr Corliss Skainssuei at 0400.  No additional orders given and RN to continue to monitor.

## 2018-06-16 ENCOUNTER — Encounter (HOSPITAL_COMMUNITY): Payer: Self-pay | Admitting: Gastroenterology

## 2018-06-16 LAB — CBC
HCT: 21.3 % — ABNORMAL LOW (ref 39.0–52.0)
HCT: 27.4 % — ABNORMAL LOW (ref 39.0–52.0)
Hemoglobin: 7 g/dL — ABNORMAL LOW (ref 13.0–17.0)
Hemoglobin: 8.7 g/dL — ABNORMAL LOW (ref 13.0–17.0)
MCH: 29.4 pg (ref 26.0–34.0)
MCH: 27.8 pg (ref 26.0–34.0)
MCHC: 32.9 g/dL (ref 30.0–36.0)
MCHC: 31.8 g/dL (ref 30.0–36.0)
MCV: 89.5 fL (ref 80.0–100.0)
MCV: 87.5 fL (ref 80.0–100.0)
Platelets: 131 10*3/uL — ABNORMAL LOW (ref 150–400)
Platelets: 153 10*3/uL (ref 150–400)
RBC: 2.38 MIL/uL — ABNORMAL LOW (ref 4.22–5.81)
RBC: 3.13 MIL/uL — ABNORMAL LOW (ref 4.22–5.81)
RDW: 14.5 % (ref 11.5–15.5)
RDW: 15.8 % — ABNORMAL HIGH (ref 11.5–15.5)
WBC: 6.8 10*3/uL (ref 4.0–10.5)
WBC: 6.6 10*3/uL (ref 4.0–10.5)
nRBC: 0 % (ref 0.0–0.2)
nRBC: 0 % (ref 0.0–0.2)

## 2018-06-16 LAB — PREPARE RBC (CROSSMATCH)

## 2018-06-16 MED ORDER — SODIUM CHLORIDE 0.9% IV SOLUTION
Freq: Once | INTRAVENOUS | Status: AC
  Administered 2018-06-16: 15:00:00 via INTRAVENOUS
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
## 2018-06-17 DIAGNOSIS — D62 Acute posthemorrhagic anemia: Secondary | ICD-10-CM

## 2018-06-17 DIAGNOSIS — K921 Melena: Secondary | ICD-10-CM

## 2018-06-17 LAB — TYPE AND SCREEN
ABO/RH(D): O POS
Antibody Screen: NEGATIVE
UNIT DIVISION: 0
Unit division: 0
Unit division: 0
Unit division: 0

## 2018-06-17 LAB — BPAM RBC
BLOOD PRODUCT EXPIRATION DATE: 202001192359
BLOOD PRODUCT EXPIRATION DATE: 202001242359
Blood Product Expiration Date: 202001172359
Blood Product Expiration Date: 202001172359
ISSUE DATE / TIME: 201912282327
ISSUE DATE / TIME: 201912272212
ISSUE DATE / TIME: 201912282327
ISSUE DATE / TIME: 201912291432
Unit Type and Rh: 5100
Unit Type and Rh: 5100
Unit Type and Rh: 9500
Unit Type and Rh: 9500

## 2018-06-17 LAB — CBC
HEMATOCRIT: 25.1 % — AB (ref 39.0–52.0)
HEMOGLOBIN: 8.2 g/dL — AB (ref 13.0–17.0)
MCH: 28.6 pg (ref 26.0–34.0)
MCHC: 32.7 g/dL (ref 30.0–36.0)
MCV: 87.5 fL (ref 80.0–100.0)
Platelets: 132 10*3/uL — ABNORMAL LOW (ref 150–400)
RBC: 2.87 MIL/uL — ABNORMAL LOW (ref 4.22–5.81)
RDW: 15.9 % — ABNORMAL HIGH (ref 11.5–15.5)
WBC: 5.6 10*3/uL (ref 4.0–10.5)
nRBC: 0 % (ref 0.0–0.2)

## 2018-06-17 MED ORDER — HYDROMORPHONE HCL 1 MG/ML IJ SOLN
0.5000 mg | INTRAMUSCULAR | Status: DC | PRN
  Administered 2018-06-17 – 2018-06-19 (×16): 1 mg via INTRAVENOUS
  Filled 2018-06-17 (×16): qty 1

## 2018-06-17 NOTE — Progress Notes (Signed)
Received pt from infacility transfer. Head to toe assessment completed. Oriented pt to staff and room. Call bell in reach. Sitter at bedside. No complaints voiced.
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
## 2018-06-18 DIAGNOSIS — K921 Melena: Secondary | ICD-10-CM

## 2018-06-18 LAB — CBC
HCT: 27.7 % — ABNORMAL LOW (ref 39.0–52.0)
Hemoglobin: 8.7 g/dL — ABNORMAL LOW (ref 13.0–17.0)
MCH: 27.5 pg (ref 26.0–34.0)
MCHC: 31.4 g/dL (ref 30.0–36.0)
MCV: 87.7 fL (ref 80.0–100.0)
Platelets: 164 10*3/uL (ref 150–400)
RBC: 3.16 MIL/uL — ABNORMAL LOW (ref 4.22–5.81)
RDW: 15.7 % — ABNORMAL HIGH (ref 11.5–15.5)
WBC: 5.7 10*3/uL (ref 4.0–10.5)
nRBC: 0 % (ref 0.0–0.2)

## 2018-06-18 MED ORDER — ACETAMINOPHEN 650 MG RE SUPP: 650.0000 mg | Freq: Four times a day (QID) | RECTAL | Status: DC | PRN

## 2018-06-18 MED ORDER — ACETAMINOPHEN 325 MG PO TABS
650.0000 mg | ORAL_TABLET | Freq: Four times a day (QID) | ORAL | Status: DC | PRN
  Administered 2018-06-19 – 2018-06-26 (×9): 650 mg via ORAL
  Filled 2018-06-18 (×10): qty 2
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
## 2018-06-18 NOTE — Plan of Care (Signed)
  Problem: Pain Managment: Goal: General experience of comfort will improve Outcome: Progressing   Problem: Spiritual Needs Goal: Ability to function at adequate level Outcome: Progressing   

## 2018-06-18 NOTE — Clinical Social Work Note (Signed)
CSW spoke with [name] with [facility] and pt is on the list. They may have avalibility at the end of the day. [name]  will contact CSW if a bed does become avaliable.  HaugenBridget Chan Rosasco, [place] [phone]

## 2018-06-18 NOTE — Progress Notes (Signed)
Pt educated about having food and visitors. Pt acknowledged the rules.
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
## 2018-06-19 LAB — CBC
HCT: 34.8 % — ABNORMAL LOW (ref 39.0–52.0)
Hemoglobin: 10.8 g/dL — ABNORMAL LOW (ref 13.0–17.0)
MCH: 27.4 pg (ref 26.0–34.0)
MCHC: 31 g/dL (ref 30.0–36.0)
MCV: 88.3 fL (ref 80.0–100.0)
Platelets: 218 10*3/uL (ref 150–400)
RBC: 3.94 MIL/uL — ABNORMAL LOW (ref 4.22–5.81)
RDW: 15.6 % — ABNORMAL HIGH (ref 11.5–15.5)
WBC: 3.7 10*3/uL — ABNORMAL LOW (ref 4.0–10.5)
nRBC: 0 % (ref 0.0–0.2)

## 2018-06-19 LAB — BASIC METABOLIC PANEL
Anion gap: 9 (ref 5–15)
BUN: 8 mg/dL (ref 6–20)
CO2: 25 mmol/L (ref 22–32)
Calcium: 8.7 mg/dL — ABNORMAL LOW (ref 8.9–10.3)
Chloride: 105 mmol/L (ref 98–111)
Creatinine, Ser: 1.18 mg/dL (ref 0.61–1.24)
GFR calc Af Amer: >60 mL/min (ref >60)
GFR calc non Af Amer: >60 mL/min (ref >60)
Glucose, Bld: 122 mg/dL — ABNORMAL HIGH (ref 70–99)
Potassium: 3.9 mmol/L (ref 3.5–5.1)
Sodium: 139 mmol/L (ref 135–145)

## 2018-06-19 MED ORDER — MORPHINE SULFATE (PF) 2 MG/ML IV SOLN
2.0000 mg | INTRAVENOUS | Status: DC | PRN
  Administered 2018-06-19 – 2018-06-26 (×39): 2 mg via INTRAVENOUS
  Filled 2018-06-19 (×40): qty 1

## 2018-06-19 NOTE — Progress Notes (Signed)
Patient temp 102.9, Charge nurse notifed MD. New orders for blood cultures and urine culture. Urine specimen collected and sent to lab. Patient med with prn Tylenol. Will cont to monitor.
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
## 2018-06-19 NOTE — Plan of Care (Signed)

## 2018-06-20 ENCOUNTER — Inpatient Hospital Stay (HOSPITAL_COMMUNITY): Payer: Medicaid Other

## 2018-06-20 DIAGNOSIS — F1410 Cocaine abuse, uncomplicated: Secondary | ICD-10-CM

## 2018-06-20 DIAGNOSIS — K209 Esophagitis, unspecified: Secondary | ICD-10-CM

## 2018-06-20 DIAGNOSIS — F1490 Cocaine use, unspecified, uncomplicated: Secondary | ICD-10-CM

## 2018-06-20 DIAGNOSIS — B952 Enterococcus as the cause of diseases classified elsewhere: Secondary | ICD-10-CM | POA: Diagnosis not present

## 2018-06-20 DIAGNOSIS — S31105A Unspecified open wound of abdominal wall, periumbilic region without penetration into peritoneal cavity, initial encounter: Secondary | ICD-10-CM

## 2018-06-20 DIAGNOSIS — R7881 Bacteremia: Secondary | ICD-10-CM

## 2018-06-20 DIAGNOSIS — X781XXA Intentional self-harm by knife, initial encounter: Secondary | ICD-10-CM

## 2018-06-20 DIAGNOSIS — F603 Borderline personality disorder: Secondary | ICD-10-CM

## 2018-06-20 DIAGNOSIS — Z915 Personal history of self-harm: Secondary | ICD-10-CM

## 2018-06-20 DIAGNOSIS — F339 Major depressive disorder, recurrent, unspecified: Secondary | ICD-10-CM

## 2018-06-20 LAB — BLOOD CULTURE ID PANEL (REFLEXED)
ACINETOBACTER BAUMANNII: NOT DETECTED
CANDIDA ALBICANS: NOT DETECTED
Candida glabrata: NOT DETECTED
Candida krusei: NOT DETECTED
Candida parapsilosis: NOT DETECTED
Candida tropicalis: NOT DETECTED
ENTEROBACTERIACEAE SPECIES: NOT DETECTED
Enterobacter cloacae complex: NOT DETECTED
Enterococcus species: DETECTED — AB
Escherichia coli: NOT DETECTED
Haemophilus influenzae: NOT DETECTED
Klebsiella oxytoca: NOT DETECTED
Klebsiella pneumoniae: NOT DETECTED
Listeria monocytogenes: NOT DETECTED
Neisseria meningitidis: NOT DETECTED
PSEUDOMONAS AERUGINOSA: NOT DETECTED
Proteus species: NOT DETECTED
STREPTOCOCCUS PNEUMONIAE: NOT DETECTED
Serratia marcescens: NOT DETECTED
Staphylococcus aureus (BCID): NOT DETECTED
Staphylococcus species: NOT DETECTED
Streptococcus agalactiae: NOT DETECTED
Streptococcus pyogenes: NOT DETECTED
Streptococcus species: NOT DETECTED
Vancomycin resistance: NOT DETECTED

## 2018-06-20 LAB — URINE CULTURE: Culture: NO GROWTH

## 2018-06-20 LAB — CBC
HEMATOCRIT: 30.1 % — AB (ref 39.0–52.0)
Hemoglobin: 9.7 g/dL — ABNORMAL LOW (ref 13.0–17.0)
MCH: 28.1 pg (ref 26.0–34.0)
MCHC: 32.2 g/dL (ref 30.0–36.0)
MCV: 87.2 fL (ref 80.0–100.0)
NRBC: 0 % (ref 0.0–0.2)
Platelets: 194 10*3/uL (ref 150–400)
RBC: 3.45 MIL/uL — ABNORMAL LOW (ref 4.22–5.81)
RDW: 15.4 % (ref 11.5–15.5)
WBC: 7.4 10*3/uL (ref 4.0–10.5)

## 2018-06-20 MED ORDER — ENOXAPARIN SODIUM 40 MG/0.4ML ~~LOC~~ SOLN
40.0000 mg | SUBCUTANEOUS | Status: DC
  Administered 2018-06-24 – 2018-06-27 (×3): 40 mg via SUBCUTANEOUS
  Filled 2018-06-20 (×14): qty 0.4

## 2018-06-20 MED ORDER — SODIUM CHLORIDE 0.9 % IV SOLN
2.0000 g | INTRAVENOUS | Status: DC
  Filled 2018-06-20 (×2): qty 2000

## 2018-06-20 MED ORDER — SODIUM CHLORIDE 0.9 % IV SOLN
3.0000 g | Freq: Four times a day (QID) | INTRAVENOUS | Status: DC
  Administered 2018-06-20 – 2018-06-22 (×7): 3 g via INTRAVENOUS
  Filled 2018-06-20 (×10): qty 3

## 2018-06-20 MED ORDER — IOHEXOL 300 MG/ML  SOLN
100.0000 mL | Freq: Once | INTRAMUSCULAR | Status: AC | PRN
  Administered 2018-06-20: 100 mL via INTRAVENOUS
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
## 2018-06-20 NOTE — Progress Notes (Signed)
The patient has been pleasant, calm and cooperative this shift.  Smiling, watching TV and has asked for the IV morphine every 2-3 hours consistently.

## 2018-06-21 DIAGNOSIS — Z888 Allergy status to other drugs, medicaments and biological substances status: Secondary | ICD-10-CM

## 2018-06-21 DIAGNOSIS — S31109D Unspecified open wound of abdominal wall, unspecified quadrant without penetration into peritoneal cavity, subsequent encounter: Secondary | ICD-10-CM

## 2018-06-21 DIAGNOSIS — X789XXD Intentional self-harm by unspecified sharp object, subsequent encounter: Secondary | ICD-10-CM

## 2018-06-21 DIAGNOSIS — B3789 Other sites of candidiasis: Secondary | ICD-10-CM

## 2018-06-21 LAB — BLOOD CULTURE ID PANEL (REFLEXED)
Acinetobacter baumannii: NOT DETECTED
CANDIDA KRUSEI: NOT DETECTED
Candida albicans: NOT DETECTED
Candida glabrata: DETECTED — AB
Candida parapsilosis: NOT DETECTED
Candida tropicalis: NOT DETECTED
ESCHERICHIA COLI: NOT DETECTED
Enterobacter cloacae complex: NOT DETECTED
Enterobacteriaceae species: NOT DETECTED
Enterococcus species: NOT DETECTED
Haemophilus influenzae: NOT DETECTED
Klebsiella oxytoca: NOT DETECTED
Klebsiella pneumoniae: NOT DETECTED
Listeria monocytogenes: NOT DETECTED
Neisseria meningitidis: NOT DETECTED
Proteus species: NOT DETECTED
Pseudomonas aeruginosa: NOT DETECTED
Serratia marcescens: NOT DETECTED
Staphylococcus aureus (BCID): NOT DETECTED
Staphylococcus species: NOT DETECTED
Streptococcus agalactiae: NOT DETECTED
Streptococcus pneumoniae: NOT DETECTED
Streptococcus pyogenes: NOT DETECTED
Streptococcus species: NOT DETECTED

## 2018-06-21 MED ORDER — SODIUM CHLORIDE 0.9 % IV SOLN
100.0000 mg | INTRAVENOUS | Status: DC
  Administered 2018-06-22 – 2018-06-25 (×4): 100 mg via INTRAVENOUS
  Filled 2018-06-21 (×6): qty 100

## 2018-06-21 MED ORDER — SODIUM CHLORIDE 0.9 % IV SOLN
200.0000 mg | Freq: Once | INTRAVENOUS | Status: AC
  Administered 2018-06-21: 200 mg via INTRAVENOUS
  Filled 2018-06-21: qty 200

## 2018-06-21 MED ORDER — OXYCODONE HCL 5 MG PO TABS
5.0000 mg | ORAL_TABLET | Freq: Four times a day (QID) | ORAL | Status: DC | PRN
  Administered 2018-06-22 – 2018-06-26 (×10): 5 mg via ORAL
  Filled 2018-06-21 (×10): qty 1

## 2018-06-21 NOTE — Progress Notes (Signed)
The patient is calling out at least every 2 hours for IV pain medication and receiving it if it is available/due.  The patient is laughing with the safety sitter while playing cards.
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
## 2018-06-22 LAB — CBC
HCT: 28.5 % — ABNORMAL LOW (ref 39.0–52.0)
Hemoglobin: 9.1 g/dL — ABNORMAL LOW (ref 13.0–17.0)
MCH: 28 pg (ref 26.0–34.0)
MCHC: 31.9 g/dL (ref 30.0–36.0)
MCV: 87.7 fL (ref 80.0–100.0)
Platelets: 281 10*3/uL (ref 150–400)
RBC: 3.25 MIL/uL — ABNORMAL LOW (ref 4.22–5.81)
RDW: 15.8 % — ABNORMAL HIGH (ref 11.5–15.5)
WBC: 8.9 10*3/uL (ref 4.0–10.5)
nRBC: 0 % (ref 0.0–0.2)

## 2018-06-22 MED ORDER — VANCOMYCIN HCL 10 G IV SOLR
1500.0000 mg | Freq: Once | INTRAVENOUS | Status: AC
  Administered 2018-06-22: 1500 mg via INTRAVENOUS
  Filled 2018-06-22: qty 1500

## 2018-06-22 MED ORDER — VANCOMYCIN HCL 10 G IV SOLR
1250.0000 mg | Freq: Two times a day (BID) | INTRAVENOUS | Status: DC
  Administered 2018-06-23: 1250 mg via INTRAVENOUS
  Filled 2018-06-22 (×2): qty 1250

## 2018-06-22 NOTE — Progress Notes (Signed)
Pharmacy Antibiotic Note  David Mcconnell is a 38 y.o. male admitted on 06/13/2018 with enterococcal  sepsis.  Pharmacy has been consulted for Vancomycin dosing.  Also on Eraxis for candida glabrata  Plan: Vancomycin 1500 mg iv x 1 then 1250 mg iv Q 12 hours Follow up Scr, antibiotic course  Height: 6\' 2"  (188 cm) Weight: 184 lb 15.5 oz (83.9 kg) IBW/kg (Calculated) : 82.2  Temp (24hrs), Avg:99.2 F (37.3 C), Min:99 F (37.2 C), Max:99.3 F (37.4 C)  Recent Labs  Lab 06/17/18 0103 06/18/18 0307 06/19/18 0328 06/19/18 2041 06/20/18 1037 06/22/18 0343  WBC 5.6 5.7 3.7*  --  7.4 8.9  CREATININE  --   --   --  1.18  --   --     Estimated Creatinine Clearance: 100.6 mL/min (by C-G formula based on SCr of 1.18 mg/dL).    Allergies  Allergen Reactions  . Tegretol [Carbamazepine] Rash    Thank you for allowing pharmacy to be a part of this patient's care. Okey Regal, PharmD [phone] 06/22/2018 1:58 PM

## 2018-06-22 NOTE — Plan of Care (Signed)
  Problem: Education: Goal: Knowledge of General Education information will improve Description Including pain rating scale, medication(s)/side effects and non-pharmacologic comfort measures Outcome: Progressing   Problem: Clinical Measurements: Goal: Ability to maintain clinical measurements within normal limits will improve Outcome: Progressing   Problem: Clinical Measurements: Goal: Will remain free from infection Outcome: Progressing   Problem: Activity: Goal: Risk for activity intolerance will decrease Outcome: Progressing   Problem: Safety: Goal: Ability to remain free from injury will improve Outcome: Progressing   Problem: Skin Integrity: Goal: Risk for impaired skin integrity will decrease Outcome: Progressing   
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
## 2018-06-23 ENCOUNTER — Inpatient Hospital Stay (HOSPITAL_COMMUNITY): Payer: Medicaid Other

## 2018-06-23 DIAGNOSIS — D62 Acute posthemorrhagic anemia: Secondary | ICD-10-CM

## 2018-06-23 DIAGNOSIS — F1410 Cocaine abuse, uncomplicated: Secondary | ICD-10-CM

## 2018-06-23 LAB — COMPREHENSIVE METABOLIC PANEL
ALK PHOS: 179 U/L — AB (ref 38–126)
ALT: 89 U/L — ABNORMAL HIGH (ref 0–44)
AST: 217 U/L — ABNORMAL HIGH (ref 15–41)
Albumin: 2.6 g/dL — ABNORMAL LOW (ref 3.5–5.0)
Anion gap: 12 (ref 5–15)
BILIRUBIN TOTAL: 0.8 mg/dL (ref 0.3–1.2)
BUN: 16 mg/dL (ref 6–20)
CO2: 17 mmol/L — ABNORMAL LOW (ref 22–32)
CREATININE: 2.56 mg/dL — AB (ref 0.61–1.24)
Calcium: 8.2 mg/dL — ABNORMAL LOW (ref 8.9–10.3)
Chloride: 107 mmol/L (ref 98–111)
GFR calc Af Amer: 36 mL/min — ABNORMAL LOW (ref >60)
GFR calc non Af Amer: 31 mL/min — ABNORMAL LOW (ref >60)
Glucose, Bld: 94 mg/dL (ref 70–99)
Potassium: 4.2 mmol/L (ref 3.5–5.1)
Sodium: 136 mmol/L (ref 135–145)
TOTAL PROTEIN: 5.4 g/dL — AB (ref 6.5–8.1)

## 2018-06-23 LAB — CBC
HCT: 28.7 % — ABNORMAL LOW (ref 39.0–52.0)
Hemoglobin: 8.8 g/dL — ABNORMAL LOW (ref 13.0–17.0)
MCH: 26.7 pg (ref 26.0–34.0)
MCHC: 30.7 g/dL (ref 30.0–36.0)
MCV: 87 fL (ref 80.0–100.0)
Platelets: 240 10*3/uL (ref 150–400)
RBC: 3.3 MIL/uL — ABNORMAL LOW (ref 4.22–5.81)
RDW: 16.1 % — ABNORMAL HIGH (ref 11.5–15.5)
WBC: 11.6 10*3/uL — ABNORMAL HIGH (ref 4.0–10.5)
nRBC: 0.3 % — ABNORMAL HIGH (ref 0.0–0.2)

## 2018-06-23 LAB — PROCALCITONIN: Procalcitonin: 93.23 ng/mL

## 2018-06-23 LAB — GLUCOSE, CAPILLARY: GLUCOSE-CAPILLARY: 92 mg/dL (ref 70–99)

## 2018-06-23 LAB — APTT: aPTT: 31 seconds (ref 24–36)

## 2018-06-23 LAB — LACTIC ACID, PLASMA
Lactic Acid, Venous: 5.7 mmol/L (ref 0.5–1.9)
Lactic Acid, Venous: 3.1 mmol/L (ref 0.5–1.9)

## 2018-06-23 LAB — PROTIME-INR
INR: 1.24
Prothrombin Time: 15.5 seconds — ABNORMAL HIGH (ref 11.4–15.2)

## 2018-06-23 MED ORDER — VANCOMYCIN HCL 10 G IV SOLR
1250.0000 mg | INTRAVENOUS | Status: DC
  Administered 2018-06-24 – 2018-06-26 (×3): 1250 mg via INTRAVENOUS
  Filled 2018-06-23 (×5): qty 1250

## 2018-06-23 MED ORDER — SODIUM CHLORIDE 0.9 % IV BOLUS
1000.0000 mL | Freq: Once | INTRAVENOUS | Status: AC
  Administered 2018-06-23: 1000 mL via INTRAVENOUS

## 2018-06-23 MED ORDER — ALUM & MAG HYDROXIDE-SIMETH 200-200-20 MG/5ML PO SUSP
30.0000 mL | Freq: Four times a day (QID) | ORAL | Status: DC | PRN
  Administered 2018-06-23: 30 mL via ORAL
  Filled 2018-06-23: qty 30

## 2018-06-23 MED ORDER — KCL IN DEXTROSE-NACL 20-5-0.45 MEQ/L-%-% IV SOLN
INTRAVENOUS | Status: DC
  Administered 2018-06-23: 12:00:00 via INTRAVENOUS
  Filled 2018-06-23: qty 1000

## 2018-06-23 MED ORDER — PANTOPRAZOLE SODIUM 40 MG PO TBEC
40.0000 mg | DELAYED_RELEASE_TABLET | Freq: Two times a day (BID) | ORAL | Status: DC
  Administered 2018-06-23 – 2018-07-08 (×29): 40 mg via ORAL
  Filled 2018-06-23 (×30): qty 1

## 2018-06-23 MED ORDER — LACTATED RINGERS IV BOLUS
1000.0000 mL | Freq: Once | INTRAVENOUS | Status: AC
  Administered 2018-06-23: 1000 mL via INTRAVENOUS

## 2018-06-23 MED ORDER — SODIUM CHLORIDE 0.9 % IV SOLN
INTRAVENOUS | Status: DC
  Administered 2018-06-23 – 2018-07-01 (×12): via INTRAVENOUS
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
## 2018-06-23 NOTE — Progress Notes (Signed)
CRITICAL VALUE ALERT  Critical Value:  Lactic Acid 5.7  Date & Time Notied:  06/23/2017 1340  Provider Notified: Dr. Kendrick Fries  Orders Received/Actions taken: Central Line placed and fluid ordered.  Erick Blinks, RN

## 2018-06-23 NOTE — Procedures (Addendum)
Central Venous Catheter Insertion Procedure Note NEVYN GARG [id number] 1981/04/01  Procedure: Insertion of Central Venous Catheter Indications: Assessment of intravascular volume, Drug and/or fluid administration and Frequent blood sampling  Procedure Details Consent: Risks of procedure as well as the alternatives and risks of each were explained to the (patient/caregiver).  Consent for procedure obtained. Time Out: Verified patient identification, verified procedure, site/side was marked, verified correct patient position, special equipment/implants available, medications/allergies/relevent history reviewed, required imaging and test results available.  Performed  Maximum sterile technique was used including antiseptics, cap, gloves, gown, hand hygiene, mask and sheet. Skin prep: Chlorhexidine; local anesthetic administered A antimicrobial bonded/coated triple lumen catheter was placed in the right internal jugular vein to 17 cm using the Seldinger technique.  Evaluation Blood flow good Complications: No apparent complications Patient did tolerate procedure well. Chest X-ray ordered to verify placement.  CXR: pending.  Procedure performed under direct supervision of Dr. Kendrick Fries and with ultrasound guidance for real time vessel cannulation.      Canary Brim, NP-C [facility] Pgr: [phone] or if no answer [phone] 06/23/2018, 3:23 PM
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
## 2018-06-23 NOTE — Progress Notes (Signed)
Patient transferring to 3M07. Report called in to [facility].

## 2018-06-23 NOTE — Plan of Care (Signed)

## 2018-06-23 NOTE — Progress Notes (Addendum)
Pt alert and verbal. Talking and laughing with a visitor at bedside. VS rechecked post fluid bolus, B/P 70/40 manually, Temp 100.9, R18, P 98. Patient denies SOB, resp distress or dizziness. Writer had a second nurse verify B/P. MD notified, new orders noted. Rapid response nurse notified. Patient mother was also notified of patients progress.

## 2018-06-23 NOTE — Progress Notes (Signed)
RN called regarding pt hypotensive, SBP 60-70's. MD made aware, 650 mg Tylenol, 1 L NS and transferred to 49m07. No interventions from RRT other than transport to unit

## 2018-06-23 NOTE — Progress Notes (Signed)
RN summoned to the patient's room with report from the suicide sitter that the patient has vomited. RN went to gather materials so that the patient can be cleaned. RN also gathered nausea medication at the same time.   When RN entered the room she monitored the NT as he poured the contents of the basin into the toilet. Visible contents of the vomit consisted of food particles.   As RN was preparing to leave the room NT alerted the RN to food wrapped in foil on the bathroom sink. When RN asked patient about how the food was brought into the room the patient stated that it was (1) the same food that the current RN saw at the patient's bedside on 06/18/2018 when she provided care for him and (2) the food was brought in by his girfriend's daughter which he identified her as being named Lexicographer.   RN reminded the patient that this was the second occurrence of this nature that had occurred during this week and that outside food is not allowed. Patient rolled his eyes and then closed his eyes and proceeded to breathe deeply.   RN made the charge nurse aware of the occurrence. RN also asked the NT to sit by the door and to monitor the patient's visitor (his girlfriend) and the patient at all times including hands. RN also told the NT that the patient's door should remain open until the girlfriend leaves. The girlfriend arrived at 0100 and left at 0330. Nursing continued to monitor.   RN has completed care for the patient and reported off to the receiving nurse. RN is signing off. Please refer to same RN's progress note from 06/19/2018.
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
## 2018-06-23 NOTE — Progress Notes (Signed)
Pharmacy Antibiotic Note  David MoleOliver T Mcconnell is a 38 y.o. male admitted on 06/13/2018 with enterococcal  sepsis.  Pharmacy has been consulted for Vancomycin dosing.  Also on Eraxis for candida glabrata  Scr up to 2.56  Plan: Decrease Vancomycin to 1250 mg iv Q 24 starting 1/6 at 1600 pm Eraxis 100 mg iv Q 24 hours Follow up Scr, antibiotic course  Height: 6\' 2"  (188 cm) Weight: 184 lb 15.5 oz (83.9 kg) IBW/kg (Calculated) : 82.2  Temp (24hrs), Avg:99.8 F (37.7 C), Min:98.4 F (36.9 C), Max:102 F (38.9 C)  Recent Labs  Lab 06/18/18 0307 06/19/18 0328 06/19/18 2041 06/20/18 1037 06/22/18 0343 06/23/18 1005  WBC 5.7 3.7*  --  7.4 8.9 11.6*  CREATININE  --   --  1.18  --   --  2.56*    Estimated Creatinine Clearance: 46.4 mL/min (A) (by C-G formula based on SCr of 2.56 mg/dL (H)).    Allergies  Allergen Reactions  . Tegretol [Carbamazepine] Rash    Thank you for allowing pharmacy to be a part of this patient's care. Okey RegalLisa Aya Geisel, PharmD [phone] 06/23/2018 11:39 AM

## 2018-06-23 NOTE — Progress Notes (Signed)
Patient noted to be diaphoretic. Alert and verbal.  VS checked T102, P102, R18, manual B/P 76/40. C/o of mid abdominal pain at site of injury. ABD soft, non distended and non tender. Bowel sounds active. No c/o nausea/vomiting. Reported BM yesterday. No SOB or resp distress noted. MD notified. New order for blood work and fluid bolus. Will cont to monitor.

## 2018-06-24 ENCOUNTER — Inpatient Hospital Stay (HOSPITAL_COMMUNITY): Payer: Medicaid Other

## 2018-06-24 DIAGNOSIS — S3991XD Unspecified injury of abdomen, subsequent encounter: Secondary | ICD-10-CM

## 2018-06-24 DIAGNOSIS — Y289XXD Contact with unspecified sharp object, undetermined intent, subsequent encounter: Secondary | ICD-10-CM

## 2018-06-24 DIAGNOSIS — B49 Unspecified mycosis: Secondary | ICD-10-CM

## 2018-06-24 DIAGNOSIS — R509 Fever, unspecified: Secondary | ICD-10-CM

## 2018-06-24 DIAGNOSIS — Z1611 Resistance to penicillins: Secondary | ICD-10-CM

## 2018-06-24 LAB — URINALYSIS, ROUTINE W REFLEX MICROSCOPIC
Bilirubin Urine: NEGATIVE
Glucose, UA: NEGATIVE mg/dL
Ketones, ur: NEGATIVE mg/dL
Leukocytes, UA: NEGATIVE
Nitrite: NEGATIVE
Protein, ur: NEGATIVE mg/dL
Specific Gravity, Urine: 1.006 (ref 1.005–1.030)
pH: 5 (ref 5.0–8.0)

## 2018-06-24 LAB — RENAL FUNCTION PANEL
Albumin: 2.2 g/dL — ABNORMAL LOW (ref 3.5–5.0)
Anion gap: 7 (ref 5–15)
BUN: 28 mg/dL — ABNORMAL HIGH (ref 6–20)
CO2: 22 mmol/L (ref 22–32)
Calcium: 7.2 mg/dL — ABNORMAL LOW (ref 8.9–10.3)
Chloride: 108 mmol/L (ref 98–111)
Creatinine, Ser: 3.24 mg/dL — ABNORMAL HIGH (ref 0.61–1.24)
GFR calc Af Amer: 27 mL/min — ABNORMAL LOW (ref >60)
GFR calc non Af Amer: 23 mL/min — ABNORMAL LOW (ref >60)
Glucose, Bld: 78 mg/dL (ref 70–99)
Phosphorus: 3.8 mg/dL (ref 2.5–4.6)
Potassium: 4.3 mmol/L (ref 3.5–5.1)
Sodium: 137 mmol/L (ref 135–145)

## 2018-06-24 LAB — TYPE AND SCREEN
ABO/RH(D): O POS
Antibody Screen: NEGATIVE

## 2018-06-24 LAB — CBC
HCT: 23.1 % — ABNORMAL LOW (ref 39.0–52.0)
Hemoglobin: 7.2 g/dL — ABNORMAL LOW (ref 13.0–17.0)
MCH: 27.2 pg (ref 26.0–34.0)
MCHC: 31.2 g/dL (ref 30.0–36.0)
MCV: 87.2 fL (ref 80.0–100.0)
Platelets: 219 10*3/uL (ref 150–400)
RBC: 2.65 MIL/uL — ABNORMAL LOW (ref 4.22–5.81)
RDW: 16.5 % — ABNORMAL HIGH (ref 11.5–15.5)
WBC: 24.2 10*3/uL — ABNORMAL HIGH (ref 4.0–10.5)
nRBC: 0 % (ref 0.0–0.2)

## 2018-06-24 MED ORDER — SODIUM CHLORIDE 0.9 % IV SOLN
3.0000 g | Freq: Two times a day (BID) | INTRAVENOUS | Status: DC
  Administered 2018-06-24 – 2018-06-25 (×3): 3 g via INTRAVENOUS
  Filled 2018-06-24 (×3): qty 3
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
## 2018-06-24 NOTE — Progress Notes (Signed)
CSW called [facility], [place] and verified to see if they have a bed available for the patient. They stated that they do not have a bed available today.   CSW will continue to follow.   Drucilla Schmidt, MSW, LCSW-A Clinical Social Worker [employer]

## 2018-06-24 NOTE — Progress Notes (Signed)
Pt having 7/10 pain to shoulder and abdomen. BP low. Pt has morphine and oxy prn. Pt asked for Toradol. MD paged. Md advised to give pt 5mg  of OXY prn as ordered and no Toradol d/t Kidney function. Will continue to monitor patient.
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
## 2018-06-24 NOTE — Progress Notes (Signed)
CRITICAL VALUE ALERT  Critical Value: Lactic 3.1  Date & Time Notied:  06/23/18  1920  Provider Notified: MD Andrey Campanile  Orders Received/Actions taken: No new orders recieved
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
## 2018-06-25 ENCOUNTER — Inpatient Hospital Stay (HOSPITAL_COMMUNITY): Payer: Medicaid Other

## 2018-06-25 DIAGNOSIS — R51 Headache: Secondary | ICD-10-CM

## 2018-06-25 DIAGNOSIS — I340 Nonrheumatic mitral (valve) insufficiency: Secondary | ICD-10-CM

## 2018-06-25 DIAGNOSIS — I361 Nonrheumatic tricuspid (valve) insufficiency: Secondary | ICD-10-CM

## 2018-06-25 LAB — CBC
HCT: 23.8 % — ABNORMAL LOW (ref 39.0–52.0)
Hemoglobin: 7.2 g/dL — ABNORMAL LOW (ref 13.0–17.0)
MCH: 26.7 pg (ref 26.0–34.0)
MCHC: 30.3 g/dL (ref 30.0–36.0)
MCV: 88.1 fL (ref 80.0–100.0)
NRBC: 0 % (ref 0.0–0.2)
Platelets: 229 10*3/uL (ref 150–400)
RBC: 2.7 MIL/uL — ABNORMAL LOW (ref 4.22–5.81)
RDW: 16.4 % — ABNORMAL HIGH (ref 11.5–15.5)
WBC: 25.5 10*3/uL — ABNORMAL HIGH (ref 4.0–10.5)

## 2018-06-25 LAB — BASIC METABOLIC PANEL
Anion gap: 7 (ref 5–15)
BUN: 19 mg/dL (ref 6–20)
CO2: 23 mmol/L (ref 22–32)
Calcium: 7.9 mg/dL — ABNORMAL LOW (ref 8.9–10.3)
Chloride: 109 mmol/L (ref 98–111)
Creatinine, Ser: 2.43 mg/dL — ABNORMAL HIGH (ref 0.61–1.24)
GFR calc Af Amer: 38 mL/min — ABNORMAL LOW (ref >60)
GFR calc non Af Amer: 33 mL/min — ABNORMAL LOW (ref >60)
Glucose, Bld: 93 mg/dL (ref 70–99)
Potassium: 4 mmol/L (ref 3.5–5.1)
Sodium: 139 mmol/L (ref 135–145)

## 2018-06-25 LAB — ECHOCARDIOGRAM COMPLETE
Height: 74 in
Weight: 2959.46 oz

## 2018-06-25 MED ORDER — SODIUM CHLORIDE 0.9 % IV SOLN
3.0000 g | Freq: Four times a day (QID) | INTRAVENOUS | Status: DC
  Administered 2018-06-25 – 2018-07-01 (×23): 3 g via INTRAVENOUS
  Filled 2018-06-25 (×24): qty 3
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
## 2018-06-25 NOTE — Clinical Social Work Note (Signed)
Clinical Social Worker continuing to follow patient for support and discharge planning needs.  Patient is not yet medically stable for discharge, however remains on the radar for [facility] once patient is medically cleared.  CSW updated PA and will remain available for support.  Macario Golds, [place] [phone]

## 2018-06-26 LAB — BASIC METABOLIC PANEL
ANION GAP: 6 (ref 5–15)
BUN: 10 mg/dL (ref 6–20)
CALCIUM: 8.1 mg/dL — AB (ref 8.9–10.3)
CO2: 25 mmol/L (ref 22–32)
CREATININE: 1.74 mg/dL — AB (ref 0.61–1.24)
Chloride: 109 mmol/L (ref 98–111)
GFR calc non Af Amer: 49 mL/min — ABNORMAL LOW (ref >60)
GFR, EST AFRICAN AMERICAN: 57 mL/min — AB (ref >60)
Glucose, Bld: 95 mg/dL (ref 70–99)
Potassium: 3.6 mmol/L (ref 3.5–5.1)
Sodium: 140 mmol/L (ref 135–145)

## 2018-06-26 LAB — CBC
HCT: 23.2 % — ABNORMAL LOW (ref 39.0–52.0)
Hemoglobin: 7.3 g/dL — ABNORMAL LOW (ref 13.0–17.0)
MCH: 27.8 pg (ref 26.0–34.0)
MCHC: 31.5 g/dL (ref 30.0–36.0)
MCV: 88.2 fL (ref 80.0–100.0)
Platelets: 299 10*3/uL (ref 150–400)
RBC: 2.63 MIL/uL — ABNORMAL LOW (ref 4.22–5.81)
RDW: 16.1 % — ABNORMAL HIGH (ref 11.5–15.5)
WBC: 21.8 10*3/uL — ABNORMAL HIGH (ref 4.0–10.5)
nRBC: 0 % (ref 0.0–0.2)

## 2018-06-26 LAB — CULTURE, BLOOD (ROUTINE X 2)
CULTURE: NO GROWTH
Culture: NO GROWTH
SPECIAL REQUESTS: ADEQUATE
Special Requests: ADEQUATE

## 2018-06-26 LAB — URINALYSIS, ROUTINE W REFLEX MICROSCOPIC
Bilirubin Urine: NEGATIVE
Glucose, UA: NEGATIVE mg/dL
Hgb urine dipstick: NEGATIVE
Ketones, ur: NEGATIVE mg/dL
LEUKOCYTES UA: NEGATIVE
Nitrite: NEGATIVE
Protein, ur: NEGATIVE mg/dL
Specific Gravity, Urine: 1.006 (ref 1.005–1.030)
pH: 6 (ref 5.0–8.0)

## 2018-06-26 MED ORDER — SODIUM CHLORIDE 0.9 % IV SOLN
100.0000 mg | INTRAVENOUS | Status: DC
  Administered 2018-06-26 – 2018-06-30 (×5): 100 mg via INTRAVENOUS
  Filled 2018-06-26 (×5): qty 100

## 2018-06-26 MED ORDER — METHOCARBAMOL 500 MG PO TABS
750.0000 mg | ORAL_TABLET | Freq: Four times a day (QID) | ORAL | Status: DC | PRN
  Administered 2018-06-26 – 2018-06-27 (×3): 750 mg via ORAL
  Filled 2018-06-26 (×3): qty 2

## 2018-06-26 MED ORDER — GABAPENTIN 300 MG PO CAPS
300.0000 mg | ORAL_CAPSULE | Freq: Three times a day (TID) | ORAL | Status: DC
  Administered 2018-06-26 – 2018-07-02 (×20): 300 mg via ORAL
  Filled 2018-06-26 (×21): qty 1

## 2018-06-26 MED ORDER — GABAPENTIN 600 MG PO TABS
300.0000 mg | ORAL_TABLET | Freq: Three times a day (TID) | ORAL | Status: DC
  Administered 2018-06-26: 300 mg via ORAL
  Filled 2018-06-26 (×2): qty 0.5

## 2018-06-26 MED ORDER — MORPHINE SULFATE (PF) 2 MG/ML IV SOLN
2.0000 mg | INTRAVENOUS | Status: DC | PRN
  Administered 2018-06-26 – 2018-06-27 (×5): 2 mg via INTRAVENOUS
  Filled 2018-06-26 (×5): qty 1

## 2018-06-26 MED ORDER — DOCUSATE SODIUM 100 MG PO CAPS
100.0000 mg | ORAL_CAPSULE | Freq: Two times a day (BID) | ORAL | Status: DC
  Administered 2018-06-26 – 2018-07-08 (×24): 100 mg via ORAL
  Filled 2018-06-26 (×25): qty 1

## 2018-06-26 MED ORDER — METHOCARBAMOL 500 MG PO TABS
500.0000 mg | ORAL_TABLET | Freq: Three times a day (TID) | ORAL | Status: DC
  Administered 2018-06-26 (×3): 500 mg via ORAL
  Filled 2018-06-26 (×3): qty 1
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
## 2018-06-27 LAB — BASIC METABOLIC PANEL
Anion gap: 8 (ref 5–15)
BUN: 8 mg/dL (ref 6–20)
CHLORIDE: 106 mmol/L (ref 98–111)
CO2: 25 mmol/L (ref 22–32)
Calcium: 8.4 mg/dL — ABNORMAL LOW (ref 8.9–10.3)
Creatinine, Ser: 1.7 mg/dL — ABNORMAL HIGH (ref 0.61–1.24)
GFR calc Af Amer: 59 mL/min — ABNORMAL LOW (ref >60)
GFR calc non Af Amer: 51 mL/min — ABNORMAL LOW (ref >60)
Glucose, Bld: 90 mg/dL (ref 70–99)
Potassium: 3.5 mmol/L (ref 3.5–5.1)
Sodium: 139 mmol/L (ref 135–145)

## 2018-06-27 LAB — CBC
HEMATOCRIT: 25.9 % — AB (ref 39.0–52.0)
Hemoglobin: 7.8 g/dL — ABNORMAL LOW (ref 13.0–17.0)
MCH: 25.9 pg — ABNORMAL LOW (ref 26.0–34.0)
MCHC: 30.1 g/dL (ref 30.0–36.0)
MCV: 86 fL (ref 80.0–100.0)
Platelets: 358 10*3/uL (ref 150–400)
RBC: 3.01 MIL/uL — ABNORMAL LOW (ref 4.22–5.81)
RDW: 16.1 % — ABNORMAL HIGH (ref 11.5–15.5)
WBC: 13 10*3/uL — ABNORMAL HIGH (ref 4.0–10.5)
nRBC: 0 % (ref 0.0–0.2)

## 2018-06-27 LAB — VANCOMYCIN, TROUGH: Vancomycin Tr: 7 ug/mL — ABNORMAL LOW (ref 15–20)

## 2018-06-27 MED ORDER — MORPHINE SULFATE (PF) 2 MG/ML IV SOLN
2.0000 mg | Freq: Four times a day (QID) | INTRAVENOUS | Status: DC | PRN
  Administered 2018-06-27 – 2018-07-02 (×16): 2 mg via INTRAVENOUS
  Filled 2018-06-27 (×19): qty 1

## 2018-06-27 MED ORDER — SODIUM CHLORIDE 0.9% FLUSH
10.0000 mL | INTRAVENOUS | Status: DC | PRN
  Administered 2018-06-28: 30 mL
  Administered 2018-07-01: 20 mL
  Administered 2018-07-02: 40 mL
  Filled 2018-06-27 (×3): qty 40

## 2018-06-27 MED ORDER — OXYCODONE HCL 5 MG PO TABS
5.0000 mg | ORAL_TABLET | Freq: Four times a day (QID) | ORAL | Status: DC | PRN
  Administered 2018-06-27: 10 mg via ORAL
  Administered 2018-06-27: 5 mg via ORAL
  Administered 2018-06-28 – 2018-07-05 (×23): 10 mg via ORAL
  Administered 2018-07-05: 5 mg via ORAL
  Administered 2018-07-06 (×2): 10 mg via ORAL
  Administered 2018-07-07: 5 mg via ORAL
  Administered 2018-07-07 – 2018-07-08 (×3): 10 mg via ORAL
  Filled 2018-06-27 (×2): qty 2
  Filled 2018-06-27: qty 1
  Filled 2018-06-27 (×8): qty 2
  Filled 2018-06-27: qty 1
  Filled 2018-06-27 (×23): qty 2

## 2018-06-27 MED ORDER — METHOCARBAMOL 750 MG PO TABS
750.0000 mg | ORAL_TABLET | Freq: Three times a day (TID) | ORAL | Status: DC
  Administered 2018-06-27 – 2018-07-08 (×45): 750 mg via ORAL
  Filled 2018-06-27 (×45): qty 1

## 2018-06-27 MED ORDER — METHOCARBAMOL 500 MG PO TABS: 750.0000 mg | ORAL_TABLET | Freq: Three times a day (TID) | ORAL | Status: DC

## 2018-06-27 MED ORDER — VANCOMYCIN HCL 10 G IV SOLR
1250.0000 mg | Freq: Two times a day (BID) | INTRAVENOUS | Status: DC
  Administered 2018-06-27 – 2018-06-30 (×6): 1250 mg via INTRAVENOUS
  Filled 2018-06-27 (×6): qty 1250

## 2018-06-27 MED ORDER — SODIUM CHLORIDE 0.9% FLUSH
10.0000 mL | Freq: Two times a day (BID) | INTRAVENOUS | Status: DC
  Administered 2018-06-29 – 2018-07-01 (×2): 10 mL
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
## 2018-06-28 LAB — CBC
HCT: 25.3 % — ABNORMAL LOW (ref 39.0–52.0)
Hemoglobin: 7.8 g/dL — ABNORMAL LOW (ref 13.0–17.0)
MCH: 26.7 pg (ref 26.0–34.0)
MCHC: 30.8 g/dL (ref 30.0–36.0)
MCV: 86.6 fL (ref 80.0–100.0)
Platelets: 353 10*3/uL (ref 150–400)
RBC: 2.92 MIL/uL — ABNORMAL LOW (ref 4.22–5.81)
RDW: 15.9 % — ABNORMAL HIGH (ref 11.5–15.5)
WBC: 10.6 10*3/uL — AB (ref 4.0–10.5)
nRBC: 0 % (ref 0.0–0.2)

## 2018-06-28 LAB — CULTURE, BLOOD (ROUTINE X 2)
Culture: NO GROWTH
Special Requests: ADEQUATE

## 2018-06-28 LAB — BASIC METABOLIC PANEL
Anion gap: 9 (ref 5–15)
BUN: 10 mg/dL (ref 6–20)
CO2: 25 mmol/L (ref 22–32)
Calcium: 8.3 mg/dL — ABNORMAL LOW (ref 8.9–10.3)
Chloride: 104 mmol/L (ref 98–111)
Creatinine, Ser: 1.83 mg/dL — ABNORMAL HIGH (ref 0.61–1.24)
GFR calc Af Amer: 54 mL/min — ABNORMAL LOW (ref >60)
GFR calc non Af Amer: 46 mL/min — ABNORMAL LOW (ref >60)
Glucose, Bld: 121 mg/dL — ABNORMAL HIGH (ref 70–99)
POTASSIUM: 3.4 mmol/L — AB (ref 3.5–5.1)
SODIUM: 138 mmol/L (ref 135–145)

## 2018-06-28 MED ORDER — MORPHINE SULFATE (PF) 2 MG/ML IV SOLN
2.0000 mg | Freq: Once | INTRAVENOUS | Status: AC
  Administered 2018-06-28: 2 mg via INTRAVENOUS
  Filled 2018-06-28: qty 1

## 2018-06-28 MED ORDER — WHITE PETROLATUM EX OINT
TOPICAL_OINTMENT | CUTANEOUS | Status: AC
  Filled 2018-06-28: qty 28.35
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
## 2018-06-28 NOTE — Plan of Care (Signed)
  Problem: Education: Goal: Knowledge of General Education information will improve Description: Including pain rating scale, medication(s)/side effects and non-pharmacologic comfort measures Outcome: Progressing   Problem: Clinical Measurements: Goal: Ability to maintain clinical measurements within normal limits will improve Outcome: Progressing   

## 2018-06-29 LAB — BASIC METABOLIC PANEL
Anion gap: 8 (ref 5–15)
BUN: 13 mg/dL (ref 6–20)
CO2: 26 mmol/L (ref 22–32)
CREATININE: 1.67 mg/dL — AB (ref 0.61–1.24)
Calcium: 8.5 mg/dL — ABNORMAL LOW (ref 8.9–10.3)
Chloride: 105 mmol/L (ref 98–111)
GFR calc Af Amer: >60 mL/min (ref >60)
GFR calc non Af Amer: 52 mL/min — ABNORMAL LOW (ref >60)
Glucose, Bld: 86 mg/dL (ref 70–99)
Potassium: 3.7 mmol/L (ref 3.5–5.1)
Sodium: 139 mmol/L (ref 135–145)

## 2018-06-29 LAB — CBC
HCT: 25.6 % — ABNORMAL LOW (ref 39.0–52.0)
Hemoglobin: 7.9 g/dL — ABNORMAL LOW (ref 13.0–17.0)
MCH: 26.4 pg (ref 26.0–34.0)
MCHC: 30.9 g/dL (ref 30.0–36.0)
MCV: 85.6 fL (ref 80.0–100.0)
NRBC: 0 % (ref 0.0–0.2)
Platelets: 397 10*3/uL (ref 150–400)
RBC: 2.99 MIL/uL — ABNORMAL LOW (ref 4.22–5.81)
RDW: 15.9 % — ABNORMAL HIGH (ref 11.5–15.5)
WBC: 7.9 10*3/uL (ref 4.0–10.5)
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
## 2018-06-30 LAB — VANCOMYCIN, TROUGH: Vancomycin Tr: 22 ug/mL (ref 15–20)

## 2018-06-30 MED ORDER — VANCOMYCIN HCL 10 G IV SOLR
2000.0000 mg | INTRAVENOUS | Status: DC
  Administered 2018-06-30: 2000 mg via INTRAVENOUS
  Filled 2018-06-30: qty 2000

## 2018-06-30 MED ORDER — POLYETHYLENE GLYCOL 3350 17 G PO PACK
17.0000 g | PACK | Freq: Every day | ORAL | Status: DC
  Administered 2018-06-30 – 2018-07-08 (×9): 17 g via ORAL
  Filled 2018-06-30 (×9): qty 1

## 2018-06-30 MED ORDER — MAGNESIUM HYDROXIDE 400 MG/5ML PO SUSP
30.0000 mL | Freq: Every day | ORAL | Status: DC | PRN
  Administered 2018-06-30: 30 mL via ORAL
  Filled 2018-06-30: qty 30

## 2018-06-30 NOTE — Progress Notes (Signed)
Pharmacy Antibiotic Note  David Mcconnell is a 38 y.o. male admitted on 06/13/2018 with bacteremia.  Pharmacy has been consulted for Vanco dosing.   ID: Abx #11 - Enterococcus bacteremia + C.glabrata fungemia  TEE negative for endocarditis. - Opthamology: no fungal balls or vitritis on dilated exam. Retinal hemorrhages OS. Plans to reevaluated in 1 week - Dr. Orvan Falconer recommends 3 drug therapy for 2 wks from from 1st negative BC.  - Afebrile, WBC down 10.6>7.9, Scr 1.67 down (stable since 1/7), BC still pending  Eraxis 1/3 >>  Unasyn 1/2 >>1/4; 1/6 >>  Vanc 1/4 >>   1/1 UCx - negative 1/1 BCx - C.glabrata (right), E.faecium (left, S to vanc only) 1/3 BCx - negative 1/5 BCx - 1 of 2 candida glabrata 1/8 BCx - NGTD  1/9: VT (not AUC) 7: Incr dose to 1250mg /12h 1/12: VT 22 (drawn 1.5hrs early): con't same  Vancomycin 2000 mg IV Q 24 hrs. Goal AUC 400-550. Expected AUC: 460 SCr used: 1.67  Plan: Vanc 1250mg  IV q12h (watch Scr fluctuations). Dose ok to give this AM Will convert patient today (1/12) to AUC dosing at 2000mg /24hrs Unasyn 3gm IV Q6H. Eraxis 100mg  IV Q24H per MD   Height: 6\' 2"  (188 cm) Weight: 209 lb 10.5 oz (95.1 kg) IBW/kg (Calculated) : 82.2  Temp (24hrs), Avg:98.3 F (36.8 C), Min:98.3 F (36.8 C), Max:98.3 F (36.8 C)  Recent Labs  Lab 06/23/18 1233 06/23/18 1821  06/25/18 0439 06/26/18 0457 06/27/18 0354 06/27/18 1657 06/28/18 0252 06/29/18 0320 06/30/18 0426  WBC  --   --    < > 25.5* 21.8* 13.0*  --  10.6* 7.9  --   CREATININE  --   --    < > 2.43* 1.74* 1.70*  --  1.83* 1.67*  --   LATICACIDVEN 5.7* 3.1*  --   --   --   --   --   --   --   --   VANCOTROUGH  --   --   --   --   --   --  7*  --   --  22*   < > = values in this interval not displayed.    Estimated Creatinine Clearance: 71.1 mL/min (A) (by C-G formula based on SCr of 1.67 mg/dL (H)).    Allergies  Allergen Reactions  . Tegretol [Carbamazepine] Rash    Jamond Neels S.  Merilynn Finland, PharmD, BCPS Clinical Staff Pharmacist Misty [name] Stillinger 06/30/2018 7:20 AM
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
## 2018-06-30 NOTE — Progress Notes (Signed)
Critical lab was called for Vancomycin level is 22. Pharmacy was called, talked to Vernard Gambles and she said that its okay to give vanc at scheduled time.

## 2018-07-01 DIAGNOSIS — F99 Mental disorder, not otherwise specified: Secondary | ICD-10-CM

## 2018-07-01 DIAGNOSIS — B379 Candidiasis, unspecified: Secondary | ICD-10-CM

## 2018-07-01 LAB — CULTURE, BLOOD (ROUTINE X 2)
CULTURE: NO GROWTH
Culture: NO GROWTH
SPECIAL REQUESTS: ADEQUATE
Special Requests: ADEQUATE
Special Requests: ADEQUATE

## 2018-07-01 LAB — ANTIFUNGAL SUSCEP, FLUCONAZOLE: Fluconazole Islt MIC: 32

## 2018-07-01 MED ORDER — FLUCONAZOLE 100 MG PO TABS
800.0000 mg | ORAL_TABLET | Freq: Every day | ORAL | Status: DC
  Administered 2018-07-01 – 2018-07-08 (×8): 800 mg via ORAL
  Filled 2018-07-01 (×8): qty 8
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
## 2018-07-01 NOTE — Clinical Social Work Note (Signed)
Clinical Social Worker continuing to follow patient for support and discharge planning needs.  Patient has been transitioned from IV antibiotics to PO and is now medically cleared for behavioral health.  Per psych note, patient remains appropriate for inpatient psych placement.  CSW contacted [facility] at [facility] to inquire about potential bed availability- await return call.  CSW remains available for support and to facilitate patient discharge plans once bed available.  MD/PA updated.  Macario Golds, [place] [phone]
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
## 2018-07-02 LAB — CBC
HCT: 26.4 % — ABNORMAL LOW (ref 39.0–52.0)
Hemoglobin: 8.1 g/dL — ABNORMAL LOW (ref 13.0–17.0)
MCH: 26.3 pg (ref 26.0–34.0)
MCHC: 30.7 g/dL (ref 30.0–36.0)
MCV: 85.7 fL (ref 80.0–100.0)
Platelets: 420 10*3/uL — ABNORMAL HIGH (ref 150–400)
RBC: 3.08 MIL/uL — ABNORMAL LOW (ref 4.22–5.81)
RDW: 15.9 % — AB (ref 11.5–15.5)
WBC: 8.1 10*3/uL (ref 4.0–10.5)
nRBC: 0 % (ref 0.0–0.2)

## 2018-07-02 LAB — BASIC METABOLIC PANEL
Anion gap: 7 (ref 5–15)
BUN: 15 mg/dL (ref 6–20)
CALCIUM: 8.8 mg/dL — AB (ref 8.9–10.3)
CO2: 27 mmol/L (ref 22–32)
Chloride: 105 mmol/L (ref 98–111)
Creatinine, Ser: 1.72 mg/dL — ABNORMAL HIGH (ref 0.61–1.24)
GFR calc Af Amer: 58 mL/min — ABNORMAL LOW (ref >60)
GFR calc non Af Amer: 50 mL/min — ABNORMAL LOW (ref >60)
Glucose, Bld: 115 mg/dL — ABNORMAL HIGH (ref 70–99)
Potassium: 3.7 mmol/L (ref 3.5–5.1)
Sodium: 139 mmol/L (ref 135–145)

## 2018-07-02 MED ORDER — LORAZEPAM 1 MG PO TABS
1.0000 mg | ORAL_TABLET | Freq: Once | ORAL | Status: AC
  Administered 2018-07-02: 1 mg via ORAL
  Filled 2018-07-02: qty 1

## 2018-07-02 MED ORDER — LORAZEPAM 2 MG/ML IJ SOLN
2.0000 mg | Freq: Once | INTRAMUSCULAR | Status: AC
  Administered 2018-07-02: 2 mg via INTRAVENOUS
  Filled 2018-07-02: qty 1

## 2018-07-02 MED ORDER — LORAZEPAM 1 MG PO TABS
1.0000 mg | ORAL_TABLET | ORAL | Status: DC | PRN
  Administered 2018-07-02 – 2018-07-08 (×25): 1 mg via ORAL
  Filled 2018-07-02 (×24): qty 1
  Filled 2018-07-02: qty 2

## 2018-07-02 NOTE — Progress Notes (Signed)
Pt disconnected IJ IV tubing. States he "refuses all medical care" because nursing staff cannot allow him to keep [employer] phone at bedside. I explained to pt that staff need their phones to give patient care and offerred to allow pt to use my phone to make a call. He states that he needs to keep a phone at his bedside at all times to "conduct business."
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
## 2018-07-02 NOTE — Social Work (Signed)
No answer from [facility] regarding bed availability at this time.  CSW continuing to follow for support with disposition.  Octavio Graves, MSW, [facility] Clinical Social Work [phone]
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
## 2018-07-03 LAB — BASIC METABOLIC PANEL
Anion gap: 9 (ref 5–15)
BUN: 12 mg/dL (ref 6–20)
CALCIUM: 9.4 mg/dL (ref 8.9–10.3)
CO2: 28 mmol/L (ref 22–32)
Chloride: 102 mmol/L (ref 98–111)
Creatinine, Ser: 1.64 mg/dL — ABNORMAL HIGH (ref 0.61–1.24)
GFR calc Af Amer: >60 mL/min (ref >60)
GFR calc non Af Amer: 53 mL/min — ABNORMAL LOW (ref >60)
Glucose, Bld: 92 mg/dL (ref 70–99)
Potassium: 4 mmol/L (ref 3.5–5.1)
Sodium: 139 mmol/L (ref 135–145)

## 2018-07-03 LAB — VITAMIN B12: Vitamin B-12: 786 pg/mL (ref 180–914)

## 2018-07-03 MED ORDER — GABAPENTIN 400 MG PO CAPS
400.0000 mg | ORAL_CAPSULE | Freq: Three times a day (TID) | ORAL | Status: DC
  Administered 2018-07-03 – 2018-07-08 (×17): 400 mg via ORAL
  Filled 2018-07-03 (×17): qty 1

## 2018-07-03 MED ORDER — TAB-A-VITE/IRON PO TABS
1.0000 | ORAL_TABLET | Freq: Every day | ORAL | Status: DC
  Administered 2018-07-03 – 2018-07-08 (×6): 1 via ORAL
  Filled 2018-07-03 (×6): qty 1

## 2018-07-03 NOTE — Clinical Social Work Note (Signed)
Clinical Social Worker continuing to follow patient for support and discharge planning needs.  Patient has been referred to both [facility] and [facility] at this time.  [facility] states that due to patient acuity and potential to become aggressive - not eligible for placement at this time.  [facility] continuing to try and make a bed available for patient.  CSW remains available for support and will facilitate patient discharge once bed available.  Macario Golds, [place] [phone]
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
## 2018-07-05 MED ORDER — NICOTINE 14 MG/24HR TD PT24
14.0000 mg | MEDICATED_PATCH | Freq: Every day | TRANSDERMAL | Status: DC
  Administered 2018-07-05 – 2018-07-08 (×5): 14 mg via TRANSDERMAL
  Filled 2018-07-05 (×4): qty 1
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
## 2018-07-05 NOTE — Progress Notes (Signed)
   07/05/18 1100  Clinical Encounter Type  Visited With Health care provider;Patient not available  Visit Type Initial   Attempted introductory visit, but per sitter, pt was asleep.  Asked sitter to let pt know chaplain came by to say hello when pt awakes.  Margretta Sidle resident, [phone]

## 2018-07-05 NOTE — Progress Notes (Signed)
CSW completed [facility] referral form due to patient being declined by multiple psychiatric hospitals in the area. CSW completed the initial [facility]   CSW will continue to follow up with [facility] to determine if bed is available.   Stacy Gardner, LCSW Clinical Social Worker  [facility]  [phone]

## 2018-07-06 MED ORDER — DIPHENHYDRAMINE HCL 25 MG PO CAPS
25.0000 mg | ORAL_CAPSULE | Freq: Four times a day (QID) | ORAL | Status: DC | PRN
  Administered 2018-07-07: 25 mg via ORAL
  Filled 2018-07-06: qty 1
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
## 2018-07-08 DIAGNOSIS — X789XXA Intentional self-harm by unspecified sharp object, initial encounter: Secondary | ICD-10-CM

## 2018-07-08 DIAGNOSIS — F332 Major depressive disorder, recurrent severe without psychotic features: Secondary | ICD-10-CM

## 2018-07-08 DIAGNOSIS — T1491XA Suicide attempt, initial encounter: Secondary | ICD-10-CM

## 2018-07-08 LAB — BASIC METABOLIC PANEL
Anion gap: 11 (ref 5–15)
BUN: 21 mg/dL — ABNORMAL HIGH (ref 6–20)
CO2: 28 mmol/L (ref 22–32)
Calcium: 9.4 mg/dL (ref 8.9–10.3)
Chloride: 98 mmol/L (ref 98–111)
Creatinine, Ser: 1.7 mg/dL — ABNORMAL HIGH (ref 0.61–1.24)
GFR calc Af Amer: 59 mL/min — ABNORMAL LOW (ref >60)
GFR calc non Af Amer: 51 mL/min — ABNORMAL LOW (ref >60)
Glucose, Bld: 94 mg/dL (ref 70–99)
Potassium: 4.7 mmol/L (ref 3.5–5.1)
SODIUM: 137 mmol/L (ref 135–145)

## 2018-07-08 LAB — CBC
HCT: 34 % — ABNORMAL LOW (ref 39.0–52.0)
Hemoglobin: 10.3 g/dL — ABNORMAL LOW (ref 13.0–17.0)
MCH: 25.6 pg — ABNORMAL LOW (ref 26.0–34.0)
MCHC: 30.3 g/dL (ref 30.0–36.0)
MCV: 84.6 fL (ref 80.0–100.0)
Platelets: 412 10*3/uL — ABNORMAL HIGH (ref 150–400)
RBC: 4.02 MIL/uL — ABNORMAL LOW (ref 4.22–5.81)
RDW: 16 % — ABNORMAL HIGH (ref 11.5–15.5)
WBC: 5.9 10*3/uL (ref 4.0–10.5)
nRBC: 0 % (ref 0.0–0.2)

## 2018-07-08 MED ORDER — GABAPENTIN 400 MG PO CAPS: 400.0000 mg | ORAL_CAPSULE | Freq: Three times a day (TID) | ORAL | 0 refills | Status: DC

## 2018-07-08 MED ORDER — FLUCONAZOLE 200 MG PO TABS: 800.0000 mg | ORAL_TABLET | Freq: Every day | ORAL | 0 refills | Status: AC

## 2018-07-08 MED ORDER — BUPROPION HCL ER (XL) 150 MG PO TB24: 150.0000 mg | ORAL_TABLET | Freq: Every day | ORAL | 0 refills | Status: AC
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
## 2018-07-08 NOTE — Progress Notes (Signed)
CSW contacted [facility] psych regarding referral. They are requesting updated medical notes. CSW will fax requested updates.   Osborne Casco Alontae Chaloux LCSW [phone]

## 2018-07-08 NOTE — Progress Notes (Signed)
CSW provided outpatient psych resources (including Intensive Outpatient contact info).   CSW signing off as no further needs present at this time.   Osborne Casco Olaoluwa Grieder LCSW [phone]

## 2018-07-08 NOTE — Plan of Care (Signed)
  Problem: Education: Goal: Knowledge of General Education information will improve Description Including pain rating scale, medication(s)/side effects and non-pharmacologic comfort measures Outcome: Adequate for Discharge   Problem: Health Behavior/Discharge Planning: Goal: Ability to manage health-related needs will improve Outcome: Adequate for Discharge   Problem: Clinical Measurements: Goal: Ability to maintain clinical measurements within normal limits will improve Outcome: Adequate for Discharge Goal: Will remain free from infection Outcome: Adequate for Discharge Goal: Diagnostic test results will improve Outcome: Adequate for Discharge Goal: Respiratory complications will improve Outcome: Adequate for Discharge Goal: Cardiovascular complication will be avoided Outcome: Adequate for Discharge   Problem: Activity: Goal: Risk for activity intolerance will decrease Outcome: Adequate for Discharge   Problem: Nutrition: Goal: Adequate nutrition will be maintained Outcome: Adequate for Discharge   Problem: Coping: Goal: Level of anxiety will decrease Outcome: Adequate for Discharge   Problem: Elimination: Goal: Will not experience complications related to bowel motility Outcome: Adequate for Discharge Goal: Will not experience complications related to urinary retention Outcome: Adequate for Discharge   Problem: Skin Integrity: Goal: Risk for impaired skin integrity will decrease Outcome: Adequate for Discharge   Problem: Spiritual Needs Goal: Ability to function at adequate level Outcome: Adequate for Discharge

## 2018-07-08 NOTE — Discharge Instructions (Signed)
1. PAIN CONTROL:  1. Pain is best controlled by a usual combination of three different methods TOGETHER:  i. Ice/Heat ii. Over the counter pain medication iii. Prescription pain medication 2. You may experience some swelling and bruising in area of wounds. Ice packs or heating pads (30-60 minutes up to 6 times a day) will help. Use ice for the first few days to help decrease swelling and bruising, then switch to heat to help relax tight/sore spots and speed recovery. Some people prefer to use ice alone, heat alone, alternating between ice & heat. Experiment to what works for you. Swelling and bruising can take several weeks to resolve.  3. It is helpful to take an over-the-counter pain medication regularly for the first few weeks. Choose one of the following that works best for you:  i. Naproxen (Aleve, etc) Two 220mg  tabs twice a day ii. Ibuprofen (Advil, etc) Three 200mg  tabs four times a day (every meal & bedtime) iii. Acetaminophen (Tylenol, etc) 500-650mg  four times a day (every meal & bedtime) 4. A prescription for pain medication (such as oxycodone, hydrocodone, etc) may be given to you upon discharge. Take your pain medication as prescribed.  i. If you are having problems/concerns with the prescription medicine (does not control pain, nausea, vomiting, rash, itching, etc), please call us [phone] to see if we need to switch you to a different pain medicine that will work better for you and/or control your side effect better. ii. If you need a refill on your pain medication, please contact your pharmacy. They will contact our office to request authorization. Prescriptions will not be filled after 5 pm or on week-ends. 1. Avoid getting constipated. When taking pain medications, it is common to experience some constipation. Increasing fluid intake and taking a fiber supplement (such as Metamucil, Citrucel, FiberCon, MiraLax, etc) 1-2 times a day regularly will usually help prevent this  problem from occurring. A mild laxative (prune juice, Milk of Magnesia, MiraLax, etc) should be taken according to package directions if there are no bowel movements after 48 hours.  2. Watch out for diarrhea. If you have many loose bowel movements, simplify your diet to bland foods & liquids for a few days. Stop any stool softeners and decrease your fiber supplement. Switching to mild anti-diarrheal medications (Kayopectate, Pepto Bismol) can help. If this worsens or does not improve, please call us. 3. Shower daily but do not bathe until your wounds heal. Cover your wounds with clean gauze and tape after showering.  4. FOLLOW UP  a. If a follow up appointment is needed one will be scheduled for you. If none is needed with our trauma team, please follow up with your primary care provider within 2-3 weeks from discharge. Please call [facility] at [phone] if you have any questions about follow up.   WHEN TO CALL us [phone]:  1. Poor pain control 2. Reactions / problems with new medications (rash/itching, nausea, etc)  3. Fever over 101.5 F (38.5 C) 4. Worsening swelling or bruising 5. Redness, swelling, foul discharge or increased pain from wounds 6. Productive cough, difficulty breathing or any other concerning symptoms  The clinic staff is available to answer your questions during regular business hours (8:30am-5pm). Please dont hesitate to call and ask to speak to one of our nurses for clinical concerns.  If you have a medical emergency, go to the nearest emergency room or call 911.  A surgeon from [facility] is always on call  the hospitals  ° °[facility]  °[address], [address], [place], [place] [address] ?  °MAIN: [phone] ? TOLL FREE: [phone] ?  °FAX [phone]  °[email or web address] ° °
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
## 2018-07-08 NOTE — Progress Notes (Signed)
Patient discharged home with girlfriend, discharge instructions given to patient/girlfriend, all questions answered and concerns addressed. Patient/family verbalized understanding.

## 2018-07-09 ENCOUNTER — Telehealth (HOSPITAL_COMMUNITY): Payer: Self-pay | Admitting: Psychiatry

## 2018-07-09 ENCOUNTER — Encounter (HOSPITAL_COMMUNITY): Payer: Self-pay | Admitting: Gastroenterology

## 2018-07-09 NOTE — Telephone Encounter (Signed)
DBrayton Caves, LCSW from [facility] called to refer pt.  A:  Placed call to pt; but there was no answer.  Left vm for him to call writer back.

## 2018-08-08 ENCOUNTER — Other Ambulatory Visit: Payer: Self-pay

## 2018-08-08 ENCOUNTER — Inpatient Hospital Stay (HOSPITAL_COMMUNITY)
Admission: EM | Admit: 2018-08-08 | Discharge: 2018-08-13 | DRG: 917 | Attending: Pulmonary Disease | Admitting: Pulmonary Disease

## 2018-08-08 DIAGNOSIS — F603 Borderline personality disorder: Secondary | ICD-10-CM | POA: Diagnosis present

## 2018-08-08 DIAGNOSIS — R1310 Dysphagia, unspecified: Secondary | ICD-10-CM | POA: Diagnosis not present

## 2018-08-08 DIAGNOSIS — Z915 Personal history of self-harm: Secondary | ICD-10-CM

## 2018-08-08 DIAGNOSIS — Z888 Allergy status to other drugs, medicaments and biological substances status: Secondary | ICD-10-CM

## 2018-08-08 DIAGNOSIS — R45851 Suicidal ideations: Secondary | ICD-10-CM | POA: Diagnosis present

## 2018-08-08 DIAGNOSIS — R198 Other specified symptoms and signs involving the digestive system and abdomen: Secondary | ICD-10-CM

## 2018-08-08 DIAGNOSIS — Z781 Physical restraint status: Secondary | ICD-10-CM

## 2018-08-08 DIAGNOSIS — S27818A Other injury of esophagus (thoracic part), initial encounter: Secondary | ICD-10-CM | POA: Diagnosis present

## 2018-08-08 DIAGNOSIS — Z4659 Encounter for fitting and adjustment of other gastrointestinal appliance and device: Secondary | ICD-10-CM

## 2018-08-08 DIAGNOSIS — Z978 Presence of other specified devices: Secondary | ICD-10-CM

## 2018-08-08 DIAGNOSIS — T6592XA Toxic effect of unspecified substance, intentional self-harm, initial encounter: Secondary | ICD-10-CM

## 2018-08-08 DIAGNOSIS — G4700 Insomnia, unspecified: Secondary | ICD-10-CM | POA: Diagnosis present

## 2018-08-08 DIAGNOSIS — X838XXA Intentional self-harm by other specified means, initial encounter: Secondary | ICD-10-CM

## 2018-08-08 DIAGNOSIS — F329 Major depressive disorder, single episode, unspecified: Secondary | ICD-10-CM | POA: Diagnosis present

## 2018-08-08 DIAGNOSIS — E871 Hypo-osmolality and hyponatremia: Secondary | ICD-10-CM | POA: Diagnosis present

## 2018-08-08 DIAGNOSIS — T18198A Other foreign object in esophagus causing other injury, initial encounter: Secondary | ICD-10-CM | POA: Diagnosis not present

## 2018-08-08 DIAGNOSIS — T182XXA Foreign body in stomach, initial encounter: Secondary | ICD-10-CM | POA: Diagnosis present

## 2018-08-08 DIAGNOSIS — R001 Bradycardia, unspecified: Secondary | ICD-10-CM | POA: Diagnosis not present

## 2018-08-08 DIAGNOSIS — I959 Hypotension, unspecified: Secondary | ICD-10-CM | POA: Diagnosis not present

## 2018-08-08 DIAGNOSIS — T189XXA Foreign body of alimentary tract, part unspecified, initial encounter: Secondary | ICD-10-CM | POA: Diagnosis present

## 2018-08-08 DIAGNOSIS — F17210 Nicotine dependence, cigarettes, uncomplicated: Secondary | ICD-10-CM | POA: Diagnosis present

## 2018-08-08 DIAGNOSIS — L299 Pruritus, unspecified: Secondary | ICD-10-CM | POA: Diagnosis not present

## 2018-08-08 DIAGNOSIS — R0689 Other abnormalities of breathing: Secondary | ICD-10-CM | POA: Diagnosis present

## 2018-08-08 DIAGNOSIS — T604X2A Toxic effect of rodenticides, intentional self-harm, initial encounter: Secondary | ICD-10-CM | POA: Diagnosis not present

## 2018-08-08 DIAGNOSIS — F1410 Cocaine abuse, uncomplicated: Secondary | ICD-10-CM | POA: Diagnosis present

## 2018-08-08 DIAGNOSIS — F1210 Cannabis abuse, uncomplicated: Secondary | ICD-10-CM | POA: Diagnosis present

## 2018-08-08 DIAGNOSIS — Z79899 Other long term (current) drug therapy: Secondary | ICD-10-CM

## 2018-08-08 DIAGNOSIS — E876 Hypokalemia: Secondary | ICD-10-CM | POA: Diagnosis present

## 2018-08-08 DIAGNOSIS — D649 Anemia, unspecified: Secondary | ICD-10-CM | POA: Diagnosis present

## 2018-08-08 DIAGNOSIS — T189XXD Foreign body of alimentary tract, part unspecified, subsequent encounter: Secondary | ICD-10-CM

## 2018-08-08 DIAGNOSIS — Z7289 Other problems related to lifestyle: Secondary | ICD-10-CM

## 2018-08-08 LAB — COMPREHENSIVE METABOLIC PANEL
ALT: 21 U/L (ref 0–44)
AST: 30 U/L (ref 15–41)
Albumin: 4.2 g/dL (ref 3.5–5.0)
Alkaline Phosphatase: 63 U/L (ref 38–126)
Anion gap: 12 (ref 5–15)
BILIRUBIN TOTAL: 0.7 mg/dL (ref 0.3–1.2)
BUN: 9 mg/dL (ref 6–20)
CHLORIDE: 98 mmol/L (ref 98–111)
CO2: 24 mmol/L (ref 22–32)
Calcium: 9.2 mg/dL (ref 8.9–10.3)
Creatinine, Ser: 1.38 mg/dL — ABNORMAL HIGH (ref 0.61–1.24)
GFR calc Af Amer: >60 mL/min (ref >60)
GFR calc non Af Amer: >60 mL/min (ref >60)
Glucose, Bld: 95 mg/dL (ref 70–99)
POTASSIUM: 2.8 mmol/L — AB (ref 3.5–5.1)
Sodium: 134 mmol/L — ABNORMAL LOW (ref 135–145)
TOTAL PROTEIN: 7.2 g/dL (ref 6.5–8.1)

## 2018-08-08 LAB — CBC WITH DIFFERENTIAL/PLATELET
Abs Immature Granulocytes: 0.05 10*3/uL (ref 0.00–0.07)
Basophils Absolute: 0 10*3/uL (ref 0.0–0.1)
Basophils Relative: 0 %
Eosinophils Absolute: 0 10*3/uL (ref 0.0–0.5)
Eosinophils Relative: 0 %
HCT: 32.8 % — ABNORMAL LOW (ref 39.0–52.0)
Hemoglobin: 9.9 g/dL — ABNORMAL LOW (ref 13.0–17.0)
IMMATURE GRANULOCYTES: 0 %
Lymphocytes Relative: 5 %
Lymphs Abs: 0.6 10*3/uL — ABNORMAL LOW (ref 0.7–4.0)
MCH: 23.7 pg — ABNORMAL LOW (ref 26.0–34.0)
MCHC: 30.2 g/dL (ref 30.0–36.0)
MCV: 78.5 fL — ABNORMAL LOW (ref 80.0–100.0)
MONOS PCT: 6 %
Monocytes Absolute: 0.7 10*3/uL (ref 0.1–1.0)
Neutro Abs: 10 10*3/uL — ABNORMAL HIGH (ref 1.7–7.7)
Neutrophils Relative %: 89 %
Platelets: 265 10*3/uL (ref 150–400)
RBC: 4.18 MIL/uL — ABNORMAL LOW (ref 4.22–5.81)
RDW: 15.7 % — AB (ref 11.5–15.5)
WBC: 11.4 10*3/uL — ABNORMAL HIGH (ref 4.0–10.5)
nRBC: 0 % (ref 0.0–0.2)

## 2018-08-08 LAB — ACETAMINOPHEN LEVEL: Acetaminophen (Tylenol), Serum: <10 ug/mL — ABNORMAL LOW (ref 10–30)

## 2018-08-08 LAB — URINALYSIS, ROUTINE W REFLEX MICROSCOPIC
Bilirubin Urine: NEGATIVE
Glucose, UA: NEGATIVE mg/dL
KETONES UR: NEGATIVE mg/dL
LEUKOCYTE UA: NEGATIVE
Nitrite: NEGATIVE
Protein, ur: 30 mg/dL — AB
Specific Gravity, Urine: 1.01 (ref 1.005–1.030)
pH: 6 (ref 5.0–8.0)

## 2018-08-08 LAB — I-STAT TROPONIN, ED: TROPONIN I, POC: 0 ng/mL (ref 0.00–0.08)

## 2018-08-08 LAB — PROTIME-INR
INR: 1.11
Prothrombin Time: 14.2 seconds (ref 11.4–15.2)

## 2018-08-08 LAB — RAPID URINE DRUG SCREEN, HOSP PERFORMED
Amphetamines: NOT DETECTED
BARBITURATES: NOT DETECTED
Benzodiazepines: NOT DETECTED
Cocaine: POSITIVE — AB
Opiates: NOT DETECTED
Tetrahydrocannabinol: POSITIVE — AB

## 2018-08-08 LAB — SALICYLATE LEVEL: Salicylate Lvl: <7 mg/dL (ref 2.8–30.0)

## 2018-08-08 MED ORDER — POTASSIUM CHLORIDE CRYS ER 20 MEQ PO TBCR
40.0000 meq | EXTENDED_RELEASE_TABLET | Freq: Once | ORAL | Status: AC
  Administered 2018-08-09: 40 meq via ORAL
  Filled 2018-08-08: qty 2
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
## 2018-08-09 ENCOUNTER — Emergency Department (HOSPITAL_COMMUNITY): Admitting: Critical Care Medicine

## 2018-08-09 ENCOUNTER — Emergency Department (HOSPITAL_COMMUNITY)

## 2018-08-09 ENCOUNTER — Encounter (HOSPITAL_COMMUNITY): Admission: EM | Payer: Self-pay | Attending: Pulmonary Disease

## 2018-08-09 ENCOUNTER — Encounter (HOSPITAL_COMMUNITY): Payer: Self-pay

## 2018-08-09 DIAGNOSIS — D649 Anemia, unspecified: Secondary | ICD-10-CM | POA: Diagnosis present

## 2018-08-09 DIAGNOSIS — T182XXA Foreign body in stomach, initial encounter: Secondary | ICD-10-CM | POA: Diagnosis present

## 2018-08-09 DIAGNOSIS — X838XXA Intentional self-harm by other specified means, initial encounter: Secondary | ICD-10-CM | POA: Diagnosis not present

## 2018-08-09 DIAGNOSIS — S27818A Other injury of esophagus (thoracic part), initial encounter: Secondary | ICD-10-CM | POA: Diagnosis present

## 2018-08-09 DIAGNOSIS — E871 Hypo-osmolality and hyponatremia: Secondary | ICD-10-CM | POA: Diagnosis present

## 2018-08-09 DIAGNOSIS — R1310 Dysphagia, unspecified: Secondary | ICD-10-CM | POA: Diagnosis not present

## 2018-08-09 DIAGNOSIS — T604X2A Toxic effect of rodenticides, intentional self-harm, initial encounter: Secondary | ICD-10-CM | POA: Diagnosis present

## 2018-08-09 DIAGNOSIS — F603 Borderline personality disorder: Secondary | ICD-10-CM | POA: Diagnosis present

## 2018-08-09 DIAGNOSIS — R45851 Suicidal ideations: Secondary | ICD-10-CM | POA: Diagnosis present

## 2018-08-09 DIAGNOSIS — F17210 Nicotine dependence, cigarettes, uncomplicated: Secondary | ICD-10-CM | POA: Diagnosis present

## 2018-08-09 DIAGNOSIS — I959 Hypotension, unspecified: Secondary | ICD-10-CM | POA: Diagnosis not present

## 2018-08-09 DIAGNOSIS — T189XXA Foreign body of alimentary tract, part unspecified, initial encounter: Secondary | ICD-10-CM | POA: Diagnosis present

## 2018-08-09 DIAGNOSIS — F1410 Cocaine abuse, uncomplicated: Secondary | ICD-10-CM | POA: Diagnosis present

## 2018-08-09 DIAGNOSIS — G4700 Insomnia, unspecified: Secondary | ICD-10-CM | POA: Diagnosis present

## 2018-08-09 DIAGNOSIS — F1210 Cannabis abuse, uncomplicated: Secondary | ICD-10-CM | POA: Diagnosis present

## 2018-08-09 DIAGNOSIS — T18198A Other foreign object in esophagus causing other injury, initial encounter: Secondary | ICD-10-CM | POA: Diagnosis not present

## 2018-08-09 DIAGNOSIS — Z79899 Other long term (current) drug therapy: Secondary | ICD-10-CM | POA: Diagnosis not present

## 2018-08-09 DIAGNOSIS — T6592XA Toxic effect of unspecified substance, intentional self-harm, initial encounter: Secondary | ICD-10-CM | POA: Diagnosis present

## 2018-08-09 DIAGNOSIS — T189XXD Foreign body of alimentary tract, part unspecified, subsequent encounter: Secondary | ICD-10-CM

## 2018-08-09 DIAGNOSIS — R0689 Other abnormalities of breathing: Secondary | ICD-10-CM | POA: Diagnosis present

## 2018-08-09 DIAGNOSIS — J9601 Acute respiratory failure with hypoxia: Secondary | ICD-10-CM

## 2018-08-09 DIAGNOSIS — E876 Hypokalemia: Secondary | ICD-10-CM | POA: Diagnosis present

## 2018-08-09 DIAGNOSIS — Z915 Personal history of self-harm: Secondary | ICD-10-CM | POA: Diagnosis not present

## 2018-08-09 DIAGNOSIS — L299 Pruritus, unspecified: Secondary | ICD-10-CM | POA: Diagnosis not present

## 2018-08-09 DIAGNOSIS — F329 Major depressive disorder, single episode, unspecified: Secondary | ICD-10-CM | POA: Diagnosis present

## 2018-08-09 DIAGNOSIS — Z781 Physical restraint status: Secondary | ICD-10-CM | POA: Diagnosis not present

## 2018-08-09 DIAGNOSIS — Z888 Allergy status to other drugs, medicaments and biological substances status: Secondary | ICD-10-CM | POA: Diagnosis not present

## 2018-08-09 DIAGNOSIS — R001 Bradycardia, unspecified: Secondary | ICD-10-CM | POA: Diagnosis not present

## 2018-08-09 HISTORY — PX: ESOPHAGOGASTRODUODENOSCOPY (EGD) WITH PROPOFOL: SHX5813

## 2018-08-09 HISTORY — PX: FOREIGN BODY REMOVAL: SHX962

## 2018-08-09 LAB — PHOSPHORUS: Phosphorus: 2.8 mg/dL (ref 2.5–4.6)

## 2018-08-09 LAB — MRSA PCR SCREENING: MRSA by PCR: POSITIVE — AB

## 2018-08-09 LAB — POCT I-STAT 7, (LYTES, BLD GAS, ICA,H+H)
ACID-BASE EXCESS: 2 mmol/L (ref 0.0–2.0)
Bicarbonate: 26.1 mmol/L (ref 20.0–28.0)
Calcium, Ion: 1.14 mmol/L — ABNORMAL LOW (ref 1.15–1.40)
HEMATOCRIT: 28 % — AB (ref 39.0–52.0)
Hemoglobin: 9.5 g/dL — ABNORMAL LOW (ref 13.0–17.0)
O2 Saturation: 71 %
PO2 ART: 35 mmHg — AB (ref 83.0–108.0)
Patient temperature: 98.6
Potassium: 3.2 mmol/L — ABNORMAL LOW (ref 3.5–5.1)
SODIUM: 139 mmol/L (ref 135–145)
TCO2: 27 mmol/L (ref 22–32)
pCO2 arterial: 37.6 mmHg (ref 32.0–48.0)
pH, Arterial: 7.449 (ref 7.350–7.450)

## 2018-08-09 LAB — PROTIME-INR
INR: 1.09
Prothrombin Time: 14 seconds (ref 11.4–15.2)

## 2018-08-09 LAB — TRIGLYCERIDES: Triglycerides: 72 mg/dL (ref <150)

## 2018-08-09 LAB — GLUCOSE, CAPILLARY
Glucose-Capillary: 145 mg/dL — ABNORMAL HIGH (ref 70–99)
Glucose-Capillary: 61 mg/dL — ABNORMAL LOW (ref 70–99)
Glucose-Capillary: 66 mg/dL — ABNORMAL LOW (ref 70–99)
Glucose-Capillary: 69 mg/dL — ABNORMAL LOW (ref 70–99)
Glucose-Capillary: 73 mg/dL (ref 70–99)
Glucose-Capillary: 74 mg/dL (ref 70–99)

## 2018-08-09 LAB — MAGNESIUM: Magnesium: 2.5 mg/dL — ABNORMAL HIGH (ref 1.7–2.4)

## 2018-08-09 SURGERY — ESOPHAGOGASTRODUODENOSCOPY (EGD) WITH PROPOFOL
Anesthesia: Monitor Anesthesia Care

## 2018-08-09 SURGERY — ESOPHAGOGASTRODUODENOSCOPY (EGD) WITH PROPOFOL
Anesthesia: General

## 2018-08-09 MED ORDER — LACTATED RINGERS IV SOLN
INTRAVENOUS | Status: DC
  Administered 2018-08-09: 1000 mL via INTRAVENOUS

## 2018-08-09 MED ORDER — HEPARIN SODIUM (PORCINE) 5000 UNIT/ML IJ SOLN
5000.0000 [IU] | Freq: Three times a day (TID) | INTRAMUSCULAR | Status: DC
  Administered 2018-08-09 – 2018-08-10 (×5): 5000 [IU] via SUBCUTANEOUS
  Filled 2018-08-09 (×5): qty 1

## 2018-08-09 MED ORDER — SUCCINYLCHOLINE CHLORIDE 200 MG/10ML IV SOSY
PREFILLED_SYRINGE | INTRAVENOUS | Status: DC | PRN
  Administered 2018-08-09: 140 mg via INTRAVENOUS

## 2018-08-09 MED ORDER — ROCURONIUM BROMIDE 10 MG/ML (PF) SYRINGE
PREFILLED_SYRINGE | INTRAVENOUS | Status: DC | PRN
  Administered 2018-08-09: 30 mg via INTRAVENOUS

## 2018-08-09 MED ORDER — CHLORHEXIDINE GLUCONATE 0.12% ORAL RINSE (MEDLINE KIT)
15.0000 mL | Freq: Two times a day (BID) | OROMUCOSAL | Status: DC
  Administered 2018-08-09 – 2018-08-10 (×2): 15 mL via OROMUCOSAL

## 2018-08-09 MED ORDER — PHENYLEPHRINE 40 MCG/ML (10ML) SYRINGE FOR IV PUSH (FOR BLOOD PRESSURE SUPPORT)
PREFILLED_SYRINGE | INTRAVENOUS | Status: DC | PRN
  Administered 2018-08-09 (×2): 80 ug via INTRAVENOUS

## 2018-08-09 MED ORDER — MIDAZOLAM HCL 2 MG/2ML IJ SOLN
2.0000 mg | INTRAMUSCULAR | Status: DC | PRN
  Administered 2018-08-09 (×2): 2 mg via INTRAVENOUS
  Filled 2018-08-09 (×5): qty 2

## 2018-08-09 MED ORDER — VITAL HIGH PROTEIN PO LIQD
1000.0000 mL | ORAL | Status: DC
  Administered 2018-08-09: 1000 mL

## 2018-08-09 MED ORDER — ONDANSETRON 4 MG PO TBDP
8.0000 mg | ORAL_TABLET | Freq: Once | ORAL | Status: AC
  Administered 2018-08-09: 8 mg via ORAL
  Filled 2018-08-09: qty 2

## 2018-08-09 MED ORDER — SODIUM CHLORIDE 0.9 % IV SOLN: INTRAVENOUS | Status: DC

## 2018-08-09 MED ORDER — PROPOFOL 500 MG/50ML IV EMUL
INTRAVENOUS | Status: DC | PRN
  Administered 2018-08-09: 75 ug/kg/min via INTRAVENOUS

## 2018-08-09 MED ORDER — SODIUM CHLORIDE 0.9 % IV SOLN
INTRAVENOUS | Status: DC
  Administered 2018-08-09 – 2018-08-12 (×6): via INTRAVENOUS

## 2018-08-09 MED ORDER — PROPOFOL 10 MG/ML IV BOLUS
INTRAVENOUS | Status: DC | PRN
  Administered 2018-08-09: 200 mg via INTRAVENOUS

## 2018-08-09 MED ORDER — FENTANYL CITRATE (PF) 100 MCG/2ML IJ SOLN
100.0000 ug | INTRAMUSCULAR | Status: DC | PRN
  Administered 2018-08-09 (×2): 100 ug via INTRAVENOUS
  Filled 2018-08-09 (×4): qty 2

## 2018-08-09 MED ORDER — PROPOFOL 1000 MG/100ML IV EMUL
0.0000 ug/kg/min | INTRAVENOUS | Status: DC
  Administered 2018-08-09 – 2018-08-10 (×4): 50 ug/kg/min via INTRAVENOUS
  Administered 2018-08-10: 35 ug/kg/min via INTRAVENOUS
  Administered 2018-08-10: 50 ug/kg/min via INTRAVENOUS
  Filled 2018-08-09: qty 100
  Filled 2018-08-09: qty 200
  Filled 2018-08-09 (×4): qty 100

## 2018-08-09 MED ORDER — ONDANSETRON HCL 4 MG/2ML IJ SOLN
INTRAMUSCULAR | Status: DC | PRN
  Administered 2018-08-09: 4 mg via INTRAVENOUS

## 2018-08-09 MED ORDER — ORAL CARE MOUTH RINSE
15.0000 mL | OROMUCOSAL | Status: DC
  Administered 2018-08-09 – 2018-08-10 (×6): 15 mL via OROMUCOSAL

## 2018-08-09 MED ORDER — PRO-STAT SUGAR FREE PO LIQD
30.0000 mL | Freq: Two times a day (BID) | ORAL | Status: DC
  Administered 2018-08-09 – 2018-08-10 (×2): 30 mL
  Filled 2018-08-09 (×2): qty 30

## 2018-08-09 MED ORDER — LACTATED RINGERS IV SOLN
INTRAVENOUS | Status: DC | PRN
  Administered 2018-08-09: 13:00:00 via INTRAVENOUS

## 2018-08-09 MED ORDER — MIDAZOLAM HCL 2 MG/2ML IJ SOLN
2.0000 mg | INTRAMUSCULAR | Status: DC | PRN
  Administered 2018-08-09 – 2018-08-10 (×4): 2 mg via INTRAVENOUS
  Filled 2018-08-09 (×3): qty 2

## 2018-08-09 MED ORDER — FENTANYL CITRATE (PF) 100 MCG/2ML IJ SOLN
100.0000 ug | INTRAMUSCULAR | Status: DC | PRN
  Administered 2018-08-09 – 2018-08-10 (×5): 100 ug via INTRAVENOUS
  Filled 2018-08-09 (×4): qty 2

## 2018-08-09 MED ORDER — DEXTROSE 50 % IV SOLN
INTRAVENOUS | Status: AC
  Administered 2018-08-09: 50 mL
  Filled 2018-08-09: qty 50

## 2018-08-09 SURGICAL SUPPLY — 15 items

## 2018-08-09 NOTE — Discharge Instructions (Signed)
YOU HAD AN ENDOSCOPIC PROCEDURE TODAY: Refer to the procedure report and other information in the discharge instructions given to you for any specific questions about what was found during the examination. If this information does not answer your questions, please call [place] office at [phone] to clarify.   YOU SHOULD EXPECT: Some feelings of bloating in the abdomen. Passage of more gas than usual. Walking can help get rid of the air that was put into your GI tract during the procedure and reduce the bloating. Some abdominal soreness may be present for a day or two, also.  DIET: Your first meal following the procedure should be a light meal and then it is ok to progress to your normal diet. A half-sandwich or bowl of soup is an example of a good first meal. Heavy or fried foods are harder to digest and may make you feel nauseous or bloated. Drink plenty of fluids but you should avoid alcoholic beverages for 24 hours. If you had a esophageal dilation, please see attached instructions for diet.    ACTIVITY: Your care partner should take you home directly after the procedure. You should plan to take it easy, moving slowly for the rest of the day. You can resume normal activity the day after the procedure however YOU SHOULD NOT DRIVE, use power tools, machinery or perform tasks that involve climbing or major physical exertion for 24 hours (because of the sedation medicines used during the test).   SYMPTOMS TO REPORT IMMEDIATELY: A gastroenterologist can be reached at any hour. Please call [phone]  for any of the following symptoms:   Following upper endoscopy (EGD, EUS, ERCP, esophageal dilation) Vomiting of blood or coffee ground material  New, significant abdominal pain  New, significant chest pain or pain under the shoulder blades  Painful or persistently difficult swallowing  New shortness of breath  Black, tarry-looking or red, bloody stools  FOLLOW UP:  If any biopsies were taken you  will be contacted by phone or by letter within the next 1-3 weeks. Call [phone]  if you have not heard about the biopsies in 3 weeks.  Please also call with any specific questions about appointments or follow up tests.  

## 2018-08-09 NOTE — Anesthesia Procedure Notes (Signed)
Procedure Name: Intubation Date/Time: 08/09/2018 12:49 PM Performed by: Rachel Moulds, CRNA Pre-anesthesia Checklist: Patient identified, Emergency Drugs available, Suction available, Patient being monitored and Timeout performed Patient Re-evaluated:Patient Re-evaluated prior to induction Oxygen Delivery Method: Circle system utilized Preoxygenation: Pre-oxygenation with 100% oxygen Induction Type: IV induction and Rapid sequence Grade View: Grade II Tube type: Oral Tube size: 7.5 mm Number of attempts: 1 Airway Equipment and Method: Stylet Placement Confirmation: ETT inserted through vocal cords under direct vision,  positive ETCO2,  CO2 detector and breath sounds checked- equal and bilateral Secured at: 23 cm Tube secured with: Tape Dental Injury: Teeth and Oropharynx as per pre-operative assessment
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
## 2018-08-09 NOTE — Anesthesia Postprocedure Evaluation (Signed)
Anesthesia Post Note  Patient: David Mcconnell  Procedure(s) Performed: ESOPHAGOGASTRODUODENOSCOPY (EGD) WITH PROPOFOL (N/A ) FOREIGN BODY REMOVAL     Patient location during evaluation: PACU Anesthesia Type: General Level of consciousness: sedated and patient cooperative Pain management: pain level controlled Vital Signs Assessment: post-procedure vital signs reviewed and stable Respiratory status: spontaneous breathing Cardiovascular status: stable Anesthetic complications: no    Last Vitals:  Vitals:   08/09/18 1220 08/09/18 1230  BP: (!) 153/61 (!) 151/77  Pulse:    Resp: 20 10  Temp:    SpO2:      Last Pain:  Vitals:   08/09/18 1150  TempSrc:   PainSc: 0-No pain                 Lewie Loron
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
## 2018-08-09 NOTE — Transfer of Care (Signed)
Immediate Anesthesia Transfer of Care Note  Patient: David Mcconnell  Procedure(s) Performed: ESOPHAGOGASTRODUODENOSCOPY (EGD) WITH PROPOFOL (N/A ) FOREIGN BODY REMOVAL  Patient Location: ICU  Anesthesia Type:General  Level of Consciousness: Patient remains intubated per anesthesia plan  Airway & Oxygen Therapy: Patient remains intubated per anesthesia plan and Patient placed on Ventilator (see vital sign flow sheet for setting)  Post-op Assessment: Report given to RN and Post -op Vital signs reviewed and stable  Post vital signs: Reviewed and stable  Last Vitals:  Vitals Value Taken Time  BP 129/69 08/09/2018  1:22 PM  Temp    Pulse 83 08/09/2018  1:28 PM  Resp 18 08/09/2018  1:28 PM  SpO2 100 % 08/09/2018  1:28 PM  Vitals shown include unvalidated device data.  Last Pain:  Vitals:   08/09/18 1150  TempSrc:   PainSc: 0-No pain         Complications: No apparent anesthesia complications

## 2018-08-09 NOTE — Progress Notes (Signed)
Hypoglycemic Event  CBG: 61  Treatment: 1 amp D 50  Symptoms: Unknown  Follow-up CBG: Time: 0822   CBG Result: 145  Possible Reasons for Event:  Tube feeds not started   Comments/MD notified: ELINK    David Mcconnell E Ein Rijo
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
## 2018-08-09 NOTE — Progress Notes (Addendum)
1134 MD in room to speak with patient.  1137 return to room to transport patient to ED. Officers at door of room within view of patient. No O2 Sat registering on monitor.  Proceeding to look at leads and discovered O2 sat probe chewed through.  Patient states swallowed part of the probe.  Missing bottom half of finger probe.  Wrist restraints remain in place. Immediately notified charge RN and MD.  1139 MD to room to assess.  Orders received.

## 2018-08-09 NOTE — Op Note (Addendum)
[facility] Patient Name: David Mcconnell Procedure Date : 08/09/2018 MRN: [id number] Attending MD: Meryl Dare , MD Date of Birth: 11-27-1980 CSN: [id number] Age: 38 Admit Type: Emergency Department Procedure:                Upper GI endoscopy Indications:              Foreign body in the stomach. Patient intentionally                            ingested half of a pulse oximeter following EGD                            earlier today. Providers:                Venita Lick. Russella Dar, MD, Norman Clay, RN, Harrington Challenger,                            Technician, Glo Herring, CRNA Referring MD:             Dr. Patria Mane Medicines:                General Anesthesia Complications:            No immediate complications. Estimated Blood Loss:     Estimated blood loss: none. Procedure:                Pre-Anesthesia Assessment:                           - Prior to the procedure, a History and Physical                            was performed, and patient medications and                            allergies were reviewed. The patient's tolerance of                            previous anesthesia was also reviewed. The risks                            and benefits of the procedure and the sedation                            options and risks were discussed with the patient.                            All questions were answered, and informed consent                            was obtained. Prior Anticoagulants: The patient has                            taken no previous anticoagulant or antiplatelet  agents. ASA Grade Assessment: II - A patient with                            mild systemic disease. After reviewing the risks                            and benefits, the patient was deemed in                            satisfactory condition to undergo the procedure.                           After obtaining informed consent, the endoscope was   passed under direct vision. Throughout the                            procedure, the patient's blood pressure, pulse, and                            oxygen saturations were monitored continuously. The                            GIF-H190 ([id number]) Olympus gastroscope was                            introduced through the mouth, and advanced to the                            second part of duodenum. The upper GI endoscopy was                            technically difficult and complex. The patient                            tolerated the procedure well. GA, intubated. Scope In: Scope Out: Findings:      A few non-bleeding superficial mucosal tears were found in the mid       esophagus in the distal esophagus. This was small to medium in size. No       additional mid or distal esophageal tears following pulse ox removal.      Two non-bleeding superficial mucosal tears were found in the proximal       esophagus just below the UES. They measured 3 to 4 mm in length and       appeared to be caused by removing the half pulse ox.      The exam of the esophagus was otherwise normal.      Pulse oximeter (half) was found in the gastric body. Removal was       accomplished with a Raptor grasping device.      The exam of the stomach was otherwise normal.      The duodenal bulb and second portion of the duodenum were normal. Impression:               - Superficial mucosal tears in the mid esophagus in  the distal esophagus.                           - Superficial mucosal tears in the proximal                            esophagus.                           - Pulse oximeter (half) was found in the stomach.                            Removal was successful.                           - Normal duodenal bulb and second portion of the                            duodenum. Recommendation:           - Admit the patient to ICU for ongoing care.                           - NPO today.                            - Discussed with CCM attending, Dr. Kendrick FriesMcQuaid, to                            consider ongoing sedation and restraint to protect                            the patient from further self harm while                            hospitalized to monitor for delayed effect of rat                            poison ingestion. Procedure Code(s):        --- Professional ---                           [phone], Esophagogastroduodenoscopy, flexible,                            transoral; with removal of foreign body(s) Diagnosis Code(s):        --- Professional ---                           U04.540JS27.818A, Other injury of esophagus (thoracic                            part), initial encounter                           T18.2XXA, Foreign body in stomach, initial encounter CPT copyright 2018 American Medical Association. All rights reserved. The codes  documented in this report are preliminary and upon coder review may  be revised to meet current compliance requirements. Meryl Dare, MD 08/09/2018 1:20:11 PM This report has been signed electronically. Number of Addenda: 0

## 2018-08-09 NOTE — Progress Notes (Signed)
Violent restraints d/c Non-violent restraints reapplied.

## 2018-08-09 NOTE — Anesthesia Postprocedure Evaluation (Signed)
Anesthesia Post Note  Patient: David Mcconnell  Procedure(s) Performed: ESOPHAGOGASTRODUODENOSCOPY (EGD) WITH PROPOFOL (N/A ) FOREIGN BODY REMOVAL     Patient location during evaluation: PACU Anesthesia Type: General Level of consciousness: sedated and patient remains intubated per anesthesia plan Pain management: pain level controlled Vital Signs Assessment: post-procedure vital signs reviewed and stable Respiratory status: patient remains intubated per anesthesia plan and patient on ventilator - see flowsheet for VS Cardiovascular status: stable Anesthetic complications: no    Last Vitals:  Vitals:   08/09/18 1300 08/09/18 1332  BP:    Pulse:    Resp:    Temp:    SpO2: 100% 100%    Last Pain:  Vitals:   08/09/18 1150  TempSrc:   PainSc: 0-No pain                 Lewie Loron

## 2018-08-09 NOTE — Progress Notes (Signed)
Non-violent restraints applied at 1330.  Violent restraints applied at 1530.

## 2018-08-09 NOTE — ED Notes (Signed)
Patient vomited on floor and himself (clear liquid); patient reminded to let staff know when he needed help.
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
## 2018-08-09 NOTE — Transfer of Care (Signed)
Immediate Anesthesia Transfer of Care Note  Patient: David Mcconnell  Procedure(s) Performed: ESOPHAGOGASTRODUODENOSCOPY (EGD) WITH PROPOFOL (N/A ) FOREIGN BODY REMOVAL  Patient Location: Endoscopy Unit  Anesthesia Type:General  Level of Consciousness: awake, alert  and oriented  Airway & Oxygen Therapy: Patient Spontanous Breathing and Patient connected to nasal cannula oxygen  Post-op Assessment: Report given to RN, Post -op Vital signs reviewed and stable and Patient moving all extremities X 4  Post vital signs: Reviewed and stable  Last Vitals:  Vitals Value Taken Time  BP 158/98 08/09/2018 11:21 AM  Temp    Pulse 73 08/09/2018 11:21 AM  Resp 10 08/09/2018 11:21 AM  SpO2 100 % 08/09/2018 11:21 AM  Vitals shown include unvalidated device data.  Last Pain:  Vitals:   08/09/18 1121  TempSrc: Oral  PainSc: 0-No pain         Complications: No apparent anesthesia complications

## 2018-08-09 NOTE — Anesthesia Procedure Notes (Signed)
Procedure Name: Intubation Date/Time: 08/09/2018 10:48 AM Performed by: Wilburn Cornelia, CRNA Pre-anesthesia Checklist: Patient identified, Emergency Drugs available, Suction available, Patient being monitored and Timeout performed Patient Re-evaluated:Patient Re-evaluated prior to induction Oxygen Delivery Method: Circle system utilized Preoxygenation: Pre-oxygenation with 100% oxygen Induction Type: IV induction, Rapid sequence and Cricoid Pressure applied Laryngoscope Size: Mac and 4 Grade View: Grade II Tube type: Oral Tube size: 7.5 mm Number of attempts: 2 Airway Equipment and Method: Stylet Placement Confirmation: ETT inserted through vocal cords under direct vision,  positive ETCO2,  CO2 detector and breath sounds checked- equal and bilateral Secured at: 23 cm Tube secured with: Tape Dental Injury: Teeth and Oropharynx as per pre-operative assessment

## 2018-08-09 NOTE — Anesthesia Preprocedure Evaluation (Signed)
Anesthesia Evaluation  Patient identified by MRN, date of birth, ID band Patient awake    Reviewed: Allergy & Precautions, NPO status , Patient's Chart, lab work & pertinent test results  Airway Mallampati: II  TM Distance: >3 FB Neck ROM: Full    Dental  (+) Teeth Intact, Dental Advisory Given   Pulmonary Current Smoker,    breath sounds clear to auscultation       Cardiovascular negative cardio ROS   Rhythm:Regular Rate:Bradycardia     Neuro/Psych PSYCHIATRIC DISORDERS Anxiety Depression Bipolar Disorder    GI/Hepatic negative GI ROS, Neg liver ROS,   Endo/Other  negative endocrine ROS  Renal/GU negative Renal ROS     Musculoskeletal negative musculoskeletal ROS (+)   Abdominal Normal abdominal exam  (+)   Peds  Hematology negative hematology ROS (+) anemia ,   Anesthesia Other Findings   Reproductive/Obstetrics                             Anesthesia Physical  Anesthesia Plan  ASA: II  Anesthesia Plan: General   Post-op Pain Management:    Induction: Intravenous, Rapid sequence and Cricoid pressure planned  PONV Risk Score and Plan: Propofol infusion and Ondansetron  Airway Management Planned: Oral ETT  Additional Equipment: None  Intra-op Plan:   Post-operative Plan: Extubation in OR  Informed Consent: I have reviewed the patients History and Physical, chart, labs and discussed the procedure including the risks, benefits and alternatives for the proposed anesthesia with the patient or authorized representative who has indicated his/her understanding and acceptance.     Dental advisory given  Plan Discussed with: CRNA  Anesthesia Plan Comments:         Anesthesia Quick Evaluation

## 2018-08-09 NOTE — Anesthesia Preprocedure Evaluation (Signed)
Anesthesia Evaluation  Patient identified by MRN, date of birth, ID band Patient awake    Reviewed: Allergy & Precautions, NPO status , Patient's Chart, lab work & pertinent test results  Airway Mallampati: II  TM Distance: >3 FB Neck ROM: Full    Dental  (+) Teeth Intact, Dental Advisory Given   Pulmonary Current Smoker,    breath sounds clear to auscultation       Cardiovascular negative cardio ROS   Rhythm:Regular Rate:Bradycardia     Neuro/Psych PSYCHIATRIC DISORDERS Anxiety Depression Bipolar Disorder    GI/Hepatic negative GI ROS, Neg liver ROS,   Endo/Other  negative endocrine ROS  Renal/GU negative Renal ROS     Musculoskeletal negative musculoskeletal ROS (+)   Abdominal Normal abdominal exam  (+)   Peds  Hematology negative hematology ROS (+) anemia ,   Anesthesia Other Findings   Reproductive/Obstetrics                             Anesthesia Physical  Anesthesia Plan  ASA: II  Anesthesia Plan: General   Post-op Pain Management:    Induction: Intravenous, Rapid sequence and Cricoid pressure planned  PONV Risk Score and Plan: Propofol infusion and Ondansetron  Airway Management Planned: Oral ETT  Additional Equipment:   Intra-op Plan:   Post-operative Plan: Post-operative intubation/ventilation  Informed Consent: I have reviewed the patients History and Physical, chart, labs and discussed the procedure including the risks, benefits and alternatives for the proposed anesthesia with the patient or authorized representative who has indicated his/her understanding and acceptance.     Dental advisory given  Plan Discussed with: CRNA  Anesthesia Plan Comments: (Dr. Kendrick Fries and Dr. Russella Dar request the patient to remain intubated post procedure.)        Anesthesia Quick Evaluation
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
## 2018-08-09 NOTE — Op Note (Addendum)
[facility] Patient Name: David Mcconnell Procedure Date : 08/09/2018 MRN: [id number] Attending MD: Meryl Dare , MD Date of Birth: 1981/02/08 CSN: [id number] Age: 38 Admit Type: Emergency Department Procedure:                Upper GI endoscopy Indications:              Foreign body in the esophagus, Foreign body in the                            stomach. Pt swallowed 2 EKG leads with wires                            attached in [facility] ED. Providers:                Venita Lick. Russella Dar, MD, Norman Clay, RN, Harrington Challenger,                            Technician, Glo Herring, CRNA Referring MD:             Dr. Patria Mcconnell Medicines:                General Anesthesia Complications:            No immediate complications. Estimated Blood Loss:     Estimated blood loss: none. Procedure:                Pre-Anesthesia Assessment:                           - Prior to the procedure, a History and Physical                            was performed, and patient medications and                            allergies were reviewed. The patient's tolerance of                            previous anesthesia was also reviewed. The risks                            and benefits of the procedure and the sedation                            options and risks were discussed with the patient.                            All questions were answered, and informed consent                            was obtained. Prior Anticoagulants: The patient has                            taken no previous anticoagulant or antiplatelet  agents. ASA Grade Assessment: II - A patient with                            mild systemic disease. After reviewing the risks                            and benefits, the patient was deemed in                            satisfactory condition to undergo the procedure.                           After obtaining informed consent, the endoscope was                             passed under direct vision. Throughout the                            procedure, the patient's blood pressure, pulse, and                            oxygen saturations were monitored continuously. The                            GIF-H190 ([id number]) Olympus gastroscope was                            introduced through the mouth, and advanced to the                            second part of duodenum. The upper GI endoscopy was                            technically difficult and complex. The patient                            tolerated the procedure well. GA with patient                            intubated. Scope In: Scope Out: Findings:      EKG lead wires (red and black) were found in the distal esophagus.      A few small non-bleeding superficial mucosal tears were found in the mid       and distal esophagus. They were small to medium in size. No additional       tears noted after foreign body removal.      The exam of the esophagus was otherwise normal.      Two EKG leads each with wires attached (red and black) were found in the       gastric fundus and in the gastric body with wires extending into the       esophagus. Removal of each separately was accomplished with a Raptor       grasping device. The plastic lead portion of each was grasped and  removed orally.      The exam of the stomach was otherwise normal.      The duodenal bulb and second portion of the duodenum were normal. Impression:               - EKG lead wires were found in the distal esophagus.                           - Superficial mucosal tears in the mid and distal                            esophagus.                           - Two EKG leads each with wires attached (red and                            black) were found in the stomach. Removal was                            successful.                           - Normal duodenal bulb and second portion of the                             duodenum. Recommendation:           - Observe patient in emergency room until patient                            is stable.                           - Clear liquid diet for the next few hours then                            advance to soft diet for next few days. Resume                            prior diet after that.                           - Continue present medications.                           - He may have mild odynophagia from esophageal                            abrasions for a few days.                           - Keep patient away from any object that he could                            ingest. Restrain patient so he won't injury himself.  Procedure Code(s):        --- Professional ---                           [phone], Esophagogastroduodenoscopy, flexible,                            transoral; with removal of foreign body(s) Diagnosis Code(s):        --- Professional ---                           [phone], Other foreign object in esophagus causing                            other injury, initial encounter                           T18.2XXA, Foreign body in stomach, initial encounter                           S27.818A, Other injury of esophagus (thoracic                            part), initial encounter                           T18.108A, Unspecified foreign body in esophagus                            causing other injury, initial encounter CPT copyright 2018 [employer] Association. All rights reserved. The codes documented in this report are preliminary and upon coder review may  be revised to meet current compliance requirements. Meryl DareMalcolm T Khadijatou Borak, MD 08/09/2018 11:53:10 AM This report has been signed electronically. Number of Addenda: 0

## 2018-08-10 ENCOUNTER — Inpatient Hospital Stay (HOSPITAL_COMMUNITY)

## 2018-08-10 DIAGNOSIS — T189XXD Foreign body of alimentary tract, part unspecified, subsequent encounter: Secondary | ICD-10-CM

## 2018-08-10 DIAGNOSIS — T189XXA Foreign body of alimentary tract, part unspecified, initial encounter: Secondary | ICD-10-CM

## 2018-08-10 DIAGNOSIS — T182XXA Foreign body in stomach, initial encounter: Secondary | ICD-10-CM

## 2018-08-10 LAB — CBC
HCT: 31.5 % — ABNORMAL LOW (ref 39.0–52.0)
Hemoglobin: 9.4 g/dL — ABNORMAL LOW (ref 13.0–17.0)
MCH: 23.7 pg — ABNORMAL LOW (ref 26.0–34.0)
MCHC: 29.8 g/dL — ABNORMAL LOW (ref 30.0–36.0)
MCV: 79.5 fL — ABNORMAL LOW (ref 80.0–100.0)
Platelets: 211 10*3/uL (ref 150–400)
RBC: 3.96 MIL/uL — ABNORMAL LOW (ref 4.22–5.81)
RDW: 15.6 % — AB (ref 11.5–15.5)
WBC: 6.9 10*3/uL (ref 4.0–10.5)
nRBC: 0 % (ref 0.0–0.2)

## 2018-08-10 LAB — BASIC METABOLIC PANEL
Anion gap: 12 (ref 5–15)
BUN: 7 mg/dL (ref 6–20)
CO2: 24 mmol/L (ref 22–32)
Calcium: 8.7 mg/dL — ABNORMAL LOW (ref 8.9–10.3)
Chloride: 104 mmol/L (ref 98–111)
Creatinine, Ser: 1.2 mg/dL (ref 0.61–1.24)
GFR calc Af Amer: >60 mL/min (ref >60)
GFR calc non Af Amer: >60 mL/min (ref >60)
Glucose, Bld: 78 mg/dL (ref 70–99)
POTASSIUM: 3.5 mmol/L (ref 3.5–5.1)
SODIUM: 140 mmol/L (ref 135–145)

## 2018-08-10 LAB — GLUCOSE, CAPILLARY
Glucose-Capillary: 104 mg/dL — ABNORMAL HIGH (ref 70–99)
Glucose-Capillary: 56 mg/dL — ABNORMAL LOW (ref 70–99)
Glucose-Capillary: 67 mg/dL — ABNORMAL LOW (ref 70–99)
Glucose-Capillary: 75 mg/dL (ref 70–99)
Glucose-Capillary: 93 mg/dL (ref 70–99)
Glucose-Capillary: 97 mg/dL (ref 70–99)

## 2018-08-10 LAB — PROTIME-INR
INR: 1.03
Prothrombin Time: 13.4 seconds (ref 11.4–15.2)

## 2018-08-10 LAB — PHOSPHORUS: Phosphorus: 4.2 mg/dL (ref 2.5–4.6)

## 2018-08-10 LAB — MAGNESIUM: MAGNESIUM: 2.4 mg/dL (ref 1.7–2.4)

## 2018-08-10 MED ORDER — SODIUM CHLORIDE 0.9 % IV BOLUS
500.0000 mL | INTRAVENOUS | Status: DC | PRN
  Administered 2018-08-11: 500 mL via INTRAVENOUS

## 2018-08-10 MED ORDER — ORAL CARE MOUTH RINSE
15.0000 mL | Freq: Two times a day (BID) | OROMUCOSAL | Status: DC
  Administered 2018-08-10 – 2018-08-11 (×3): 15 mL via OROMUCOSAL

## 2018-08-10 MED ORDER — DEXTROSE 5 % IV SOLN
INTRAVENOUS | Status: DC
  Administered 2018-08-10 – 2018-08-11 (×3): via INTRAVENOUS

## 2018-08-10 MED ORDER — DEXMEDETOMIDINE HCL IN NACL 400 MCG/100ML IV SOLN
0.4000 ug/kg/h | INTRAVENOUS | Status: DC
  Administered 2018-08-10 – 2018-08-11 (×3): 0.6 ug/kg/h via INTRAVENOUS
  Filled 2018-08-10 (×4): qty 100

## 2018-08-10 MED ORDER — MIDAZOLAM HCL 2 MG/2ML IJ SOLN: 2.0000 mg | Freq: Once | INTRAMUSCULAR | Status: DC

## 2018-08-10 MED ORDER — MIDAZOLAM HCL 2 MG/2ML IJ SOLN
1.0000 mg | Freq: Once | INTRAMUSCULAR | Status: AC
  Administered 2018-08-10: 1 mg via INTRAVENOUS

## 2018-08-10 MED ORDER — DEXTROSE 50 % IV SOLN
INTRAVENOUS | Status: AC
  Filled 2018-08-10: qty 50

## 2018-08-10 MED ORDER — SODIUM CHLORIDE 0.9 % IV BOLUS
500.0000 mL | Freq: Once | INTRAVENOUS | Status: AC
  Administered 2018-08-10: 500 mL via INTRAVENOUS

## 2018-08-10 MED ORDER — DEXTROSE 50 % IV SOLN
25.0000 mL | Freq: Once | INTRAVENOUS | Status: AC
  Administered 2018-08-10: 25 mL via INTRAVENOUS

## 2018-08-10 MED ORDER — PRO-STAT SUGAR FREE PO LIQD: 50.0000 mL | Freq: Two times a day (BID) | ORAL | Status: DC
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
## 2018-08-10 NOTE — Progress Notes (Signed)
Bladder scan showed >966ml of urine in the bladder. ELink notified. Foley ordered and inserted.

## 2018-08-10 NOTE — Progress Notes (Signed)
Upon assessing the patient about 1840 and attempting to place the pulse ox back on (pt. has taken it off several times throughout the day) the blue end of the wire appeared to have been ripped off. Pt. then stated he swallowed it. No evidence left in bed after attempting to locate the rest of the pulse ox...pt. then stated again that he had swallowed it. Elink notified after 7 and passed on to night shift RN.

## 2018-08-10 NOTE — Plan of Care (Signed)
Called to evaluate patient at bedside. Patient had self extubated earlier in the day. Respiratory status stable with no distress. In the evening, patient swallowed pulse ox again. GI informed. Planned for procedure in AM but requesting intubation now.   Upon my assessment, patient is sleeping in bed. He is on precedex gtt. No acute indication for intubation at this time. Can perform elective intubation in AM for procedure. Will titrate up the precedex to prevent patient from swallowing more foreign objects. Can consider addition of other IV/IM antipsychotics.   Griffin Basil, M.D. [facility] Pulmonary/Critical Care Medicine After hours pager: [phone].

## 2018-08-10 NOTE — Progress Notes (Addendum)
Nutrition Consult/Brief Note  RD consulted for enteral/TF management.  Vital High Protein initiated 2/21 via Adult TF Protocol. Pt self extubated 1214 today; TF discontinued via OGT.  Spoke with Lequita Halt, RN. RD to discontinue remaining Adult TF Protocol orders.  No nutrition interventions warranted at this time.  Please re-consult as needed.   Maureen Chatters, RD, LDN Pager #: [phone] After-Hours Pager #: [phone]

## 2018-08-10 NOTE — Progress Notes (Addendum)
Patient self extubated He was sedated Literally just sat up straight in bed and pulled the tube out  He is awake, agitated Stable hemodynamics Clear breath sounds  We will leave him off the vent Sedation  We will give him 1 of Versed Start him on Precedex  Placed him on supplemental oxygen, saturations was 100% Heart rate in the 70s Systolic blood pressure 130s

## 2018-08-11 ENCOUNTER — Inpatient Hospital Stay (HOSPITAL_COMMUNITY)

## 2018-08-11 ENCOUNTER — Encounter (HOSPITAL_COMMUNITY): Admission: EM | Payer: Self-pay | Attending: Pulmonary Disease

## 2018-08-11 ENCOUNTER — Encounter (HOSPITAL_COMMUNITY): Payer: Self-pay | Admitting: *Deleted

## 2018-08-11 DIAGNOSIS — Z9189 Other specified personal risk factors, not elsewhere classified: Secondary | ICD-10-CM

## 2018-08-11 HISTORY — PX: ESOPHAGOGASTRODUODENOSCOPY (EGD) WITH PROPOFOL: SHX5813

## 2018-08-11 HISTORY — PX: FOREIGN BODY REMOVAL: SHX962

## 2018-08-11 LAB — COMPREHENSIVE METABOLIC PANEL
ALT: 16 U/L (ref 0–44)
AST: 16 U/L (ref 15–41)
Albumin: 3 g/dL — ABNORMAL LOW (ref 3.5–5.0)
Alkaline Phosphatase: 48 U/L (ref 38–126)
Anion gap: 7 (ref 5–15)
BUN: 7 mg/dL (ref 6–20)
CO2: 23 mmol/L (ref 22–32)
Calcium: 8.6 mg/dL — ABNORMAL LOW (ref 8.9–10.3)
Chloride: 109 mmol/L (ref 98–111)
Creatinine, Ser: 1.11 mg/dL (ref 0.61–1.24)
GFR calc Af Amer: >60 mL/min (ref >60)
GFR calc non Af Amer: >60 mL/min (ref >60)
Glucose, Bld: 120 mg/dL — ABNORMAL HIGH (ref 70–99)
Potassium: 3.7 mmol/L (ref 3.5–5.1)
Sodium: 139 mmol/L (ref 135–145)
Total Bilirubin: 0.5 mg/dL (ref 0.3–1.2)
Total Protein: 5.6 g/dL — ABNORMAL LOW (ref 6.5–8.1)

## 2018-08-11 LAB — GLUCOSE, CAPILLARY
Glucose-Capillary: 114 mg/dL — ABNORMAL HIGH (ref 70–99)
Glucose-Capillary: 110 mg/dL — ABNORMAL HIGH (ref 70–99)
Glucose-Capillary: 111 mg/dL — ABNORMAL HIGH (ref 70–99)
Glucose-Capillary: 100 mg/dL — ABNORMAL HIGH (ref 70–99)
Glucose-Capillary: 100 mg/dL — ABNORMAL HIGH (ref 70–99)
Glucose-Capillary: 72 mg/dL (ref 70–99)

## 2018-08-11 LAB — PROTIME-INR
INR: 1.03
Prothrombin Time: 13.4 seconds (ref 11.4–15.2)

## 2018-08-11 LAB — POCT I-STAT 7, (LYTES, BLD GAS, ICA,H+H)
Acid-base deficit: 1 mmol/L (ref 0.0–2.0)
Bicarbonate: 23.4 mmol/L (ref 20.0–28.0)
Calcium, Ion: 1.23 mmol/L (ref 1.15–1.40)
HCT: 27 % — ABNORMAL LOW (ref 39.0–52.0)
Hemoglobin: 9.2 g/dL — ABNORMAL LOW (ref 13.0–17.0)
O2 Saturation: 83 %
POTASSIUM: 3.7 mmol/L (ref 3.5–5.1)
Patient temperature: 98.6
Sodium: 138 mmol/L (ref 135–145)
TCO2: 24 mmol/L (ref 22–32)
pCO2 arterial: 36.8 mmHg (ref 32.0–48.0)
pH, Arterial: 7.411 (ref 7.350–7.450)
pO2, Arterial: 47 mmHg — ABNORMAL LOW (ref 83.0–108.0)

## 2018-08-11 SURGERY — ESOPHAGOGASTRODUODENOSCOPY (EGD) WITH PROPOFOL
Anesthesia: Moderate Sedation

## 2018-08-11 MED ORDER — ROCURONIUM BROMIDE 50 MG/5ML IV SOLN
90.0000 mg | Freq: Once | INTRAVENOUS | Status: AC
  Administered 2018-08-11: 90 mg via INTRAVENOUS

## 2018-08-11 MED ORDER — FENTANYL CITRATE (PF) 100 MCG/2ML IJ SOLN
50.0000 ug | Freq: Once | INTRAMUSCULAR | Status: AC
  Administered 2018-08-11: 50 ug via INTRAVENOUS

## 2018-08-11 MED ORDER — MIDAZOLAM HCL 2 MG/2ML IJ SOLN
INTRAMUSCULAR | Status: AC
  Administered 2018-08-11: 2 mg via INTRAVENOUS
  Filled 2018-08-11: qty 2

## 2018-08-11 MED ORDER — ETOMIDATE 2 MG/ML IV SOLN
20.0000 mg | Freq: Once | INTRAVENOUS | Status: AC
  Administered 2018-08-11: 20 mg via INTRAVENOUS

## 2018-08-11 MED ORDER — MIDAZOLAM 50MG/50ML (1MG/ML) PREMIX INFUSION
0.5000 mg/h | INTRAVENOUS | Status: DC
  Administered 2018-08-11: 2 mg/h via INTRAVENOUS
  Administered 2018-08-11 – 2018-08-12 (×2): 5 mg/h via INTRAVENOUS
  Filled 2018-08-11 (×3): qty 50

## 2018-08-11 MED ORDER — MIDAZOLAM HCL (PF) 5 MG/ML IJ SOLN
INTRAMUSCULAR | Status: AC
  Filled 2018-08-11: qty 3

## 2018-08-11 MED ORDER — PHENYLEPHRINE HCL-NACL 10-0.9 MG/250ML-% IV SOLN: 0.0000 ug/min | INTRAVENOUS | Status: DC

## 2018-08-11 MED ORDER — MIDAZOLAM HCL 2 MG/2ML IJ SOLN
2.0000 mg | Freq: Once | INTRAMUSCULAR | Status: AC
  Administered 2018-08-11: 2 mg via INTRAVENOUS

## 2018-08-11 MED ORDER — ORAL CARE MOUTH RINSE
15.0000 mL | OROMUCOSAL | Status: DC
  Administered 2018-08-11 – 2018-08-12 (×4): 15 mL via OROMUCOSAL

## 2018-08-11 MED ORDER — CHLORHEXIDINE GLUCONATE 0.12% ORAL RINSE (MEDLINE KIT)
15.0000 mL | Freq: Two times a day (BID) | OROMUCOSAL | Status: DC
  Administered 2018-08-12: 15 mL via OROMUCOSAL

## 2018-08-11 MED ORDER — ETOMIDATE 2 MG/ML IV SOLN: 20.0000 mg | Freq: Once | INTRAVENOUS | Status: DC

## 2018-08-11 MED ORDER — ROCURONIUM BROMIDE 50 MG/5ML IV SOLN: 90.0000 mg | Freq: Once | INTRAVENOUS | Status: DC

## 2018-08-11 MED ORDER — SODIUM CHLORIDE 0.9 % IV BOLUS
500.0000 mL | Freq: Once | INTRAVENOUS | Status: AC
  Administered 2018-08-12: 500 mL via INTRAVENOUS

## 2018-08-11 MED ORDER — SODIUM CHLORIDE 0.9 % IV SOLN
0.5000 mg/h | INTRAVENOUS | Status: DC
  Administered 2018-08-11: 5 mg/h via INTRAVENOUS
  Administered 2018-08-11: 3 mg/h via INTRAVENOUS
  Filled 2018-08-11 (×3): qty 5

## 2018-08-11 MED ORDER — MIDAZOLAM HCL (PF) 10 MG/2ML IJ SOLN
INTRAMUSCULAR | Status: DC | PRN
  Administered 2018-08-11: 1 mg via INTRAVENOUS
  Administered 2018-08-11 (×2): 2 mg via INTRAVENOUS

## 2018-08-11 MED ORDER — DIPHENHYDRAMINE HCL 50 MG/ML IJ SOLN
INTRAMUSCULAR | Status: AC
  Filled 2018-08-11: qty 1

## 2018-08-11 MED ORDER — FAMOTIDINE IN NACL 20-0.9 MG/50ML-% IV SOLN
20.0000 mg | Freq: Two times a day (BID) | INTRAVENOUS | Status: DC
  Administered 2018-08-11 – 2018-08-12 (×3): 20 mg via INTRAVENOUS
  Filled 2018-08-11 (×3): qty 50

## 2018-08-11 MED ORDER — HEPARIN SODIUM (PORCINE) 5000 UNIT/ML IJ SOLN
5000.0000 [IU] | Freq: Three times a day (TID) | INTRAMUSCULAR | Status: DC
  Administered 2018-08-11 – 2018-08-13 (×4): 5000 [IU] via SUBCUTANEOUS
  Filled 2018-08-11 (×4): qty 1

## 2018-08-11 MED ORDER — DEXMEDETOMIDINE HCL IN NACL 400 MCG/100ML IV SOLN
0.0000 ug/kg/h | INTRAVENOUS | Status: DC
  Administered 2018-08-11: 0.596 ug/kg/h via INTRAVENOUS

## 2018-08-11 MED ORDER — FENTANYL CITRATE (PF) 100 MCG/2ML IJ SOLN
INTRAMUSCULAR | Status: AC
  Filled 2018-08-11: qty 4

## 2018-08-11 MED ORDER — MIDAZOLAM HCL 2 MG/2ML IJ SOLN
2.0000 mg | INTRAMUSCULAR | Status: DC | PRN
  Administered 2018-08-11 – 2018-08-12 (×3): 2 mg via INTRAVENOUS
  Filled 2018-08-11: qty 2

## 2018-08-11 MED ORDER — MIDAZOLAM HCL 2 MG/2ML IJ SOLN: 2.0000 mg | INTRAMUSCULAR | Status: DC | PRN

## 2018-08-11 MED ORDER — FENTANYL CITRATE (PF) 100 MCG/2ML IJ SOLN
INTRAMUSCULAR | Status: AC
  Administered 2018-08-11: 50 ug via INTRAVENOUS
  Filled 2018-08-11: qty 2

## 2018-08-11 SURGICAL SUPPLY — 15 items

## 2018-08-11 NOTE — Progress Notes (Addendum)
Patient has current warrants for his arrest for breaking his parole after charges of assault on 08/09/18 and "will most likely be returning to prison" based on these and other charges on 08/08/18.  08/08/18 Charges prior to these entail kidnapping, larceny after breaking and entering, assault by strangulation.   Information provided by police as this was obtained to see what the motive of the patient is at this time as he has a history of ingesting foreign objects to get what he wants (per patient report from a psychiatrist at [facility]).   See highlighted sections from his note below.  Caveat:  Antisocial traits of being very charming and manipulative, yet dangerous.  David Mcconnell is a 38 y.o. male patient admitted with intentional overdose.  He reports, "I did overdose on purpose to see how much my girlfriend loved me."  Needs admission to psychiatric hospital after medically clear.  Dr Lucianne Muss reviewed this patient and concurs with the plan.  This patient has been several times by this provider for swallowing things to manipulate what he wants.  The information below was obtained from her note David Mcconnell) on 11/2 and has the patient's motive quoted and witnessed by a NP student and Neurosurgeon.  04/20/18:  38 yo male who came to the ED after swallowing metal to get the facility to remove a nail and pen he placed in his laparoscopic old wound.  He is not suicidal/homicidal, hallucinating, or withdrawing.  Reports he purposefully swallowed metal only to get the nail out.  Unfortunately we cannot stop him from swallowing things but encouraged him to use his coping skills versus swallowing things.  Stable for discharge.  38 yo male who presented to the ED   History of placing things in his abdomen and swallowing things intentionally since 2012 when he was in prison.  He intentionally overdosed on 10/12 to see if his girlfriend loved him but denied trying to kill himself.  Federick also swallowed something at that  time.  On this admission, he swallowed additional things and calmly asks for specific narcotics but no signs of pain and wanted an increase to a solid diet shortly after surgery. Continues to deny suicidal ideations.    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  Cocaine abuse but minimizes his use.  Today, he is busy talking to his sitter in a flirtatious manner, up to the shower without issues.  He demonstrates traits of antisocial and borderline personality disorder. The last time this provider saw him, he threatened to tear up [facility] if he was admitted because he did not want to go--calmly said this as he was lying in his bed.     This afternoon on assessment, he is pleasantly eating heartily and chatting with his sitter.  Continues to deny he was suicidal or threats to hurt himself.  Calmly tells this provider and PMHNP student that he started this behavior is prison "because they wanted to give me forty years."  Then, I continued the behaviors whenever he wanted to move prisons.  "A psychiatrist at [facility] said it was manipulation but I was doing it on purpose to get what I wanted."  When this provider let him know this was a definition of manipulation he laughed heartily and agreed.  Then asked what his motive was at this time.  He said he was upset with himself and stuck the nail and pen in an old endoscopic scar.  Then when he did not get  it removed, intentionally swallowed metal pieces he hid in the ED so they would have to do surgery and remove everything.  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  Now, he is claiming he swallowed additionally things so they will remove everything.  Psychiatry, nor medications, can control this behavior.  No threats to others or self or hallucinations.  Plan is to discharge him from psychiatry tomorrow when the psychiatrist returns.  His sentence for habitual  breaking and entering (felony) was reduced from 10 to 5 years due to his swallowing objects.  [name] told this provider this information in the presences of a NP student and sitter.  David Mcconnell, PMHNP

## 2018-08-11 NOTE — Progress Notes (Signed)
[facility] PCCM  Called for psychiatry consult this morning to behavioral health, was instructed to place epic order, they did not know who was on call for psychiatry.  Called [facility] at 8 AM to ask for assistance, still have not heard from psychiatry.  Asking for assistance again.  Heber [place], MD [place] PCCM Pager: [phone] Cell: [phone] If no response, call [phone]

## 2018-08-11 NOTE — Progress Notes (Signed)
eLink Physician-Brief Progress Note Patient Name: David Mcconnell DOB: [birth year] MRN: [id number]   Date of Service  08/11/2018  HPI/Events of Note  Hypotension = BP = 83/48 with MAP = 60. LVEF = 50-55%  eICU Interventions  Will order: 1. Bolus with 0.9 NaCl 500 mL IV over 30 minutes now.  2. Phenylephrine IV infusion. Titrate to MAP > = 65.      Intervention Category Major Interventions: Hypotension - evaluation and management  Sommer,Steven Dennard Nip 08/11/2018, 11:46 PM

## 2018-08-11 NOTE — Interval H&P Note (Signed)
History and Physical Interval Note:  08/11/2018 2:37 PM  David Mcconnell  has presented today for surgery, with the diagnosis of swallowed pulse ox, again!!!  The various methods of treatment have been discussed with the patient and family. After consideration of risks, benefits and other options for treatment, the patient has consented to  Procedure(s): ESOPHAGOGASTRODUODENOSCOPY (EGD) WITH PROPOFOL (N/A) FOREIGN BODY REMOVAL (N/A) as a surgical intervention .  The patient's history has been reviewed, patient examined, no change in status, stable for surgery.  I have reviewed the patient's chart and labs.  Questions were answered to the patient's satisfaction.     Venita Lick. Russella Dar

## 2018-08-11 NOTE — Consult Note (Addendum)
Per Dr. Kendrick Fries request, I saw [name] in room [phone] with police officer at his bedside but I am unable to do a psychiatric interview because he is intubated.   Recommendation: Re-consult psychiatric service when patient is alert, awake and can hold conversation.  Thedore Mins, MD Attending psychiatrist

## 2018-08-11 NOTE — Procedures (Addendum)
Intubation Procedure Note David Mcconnell [id number] 1981/02/12  Procedure: Intubation Indications: To facilitate endoscopy to retrieve foreign body  Procedure Details Consent: Unable to obtain consent because of emergent medical necessity. Time Out: Verified patient identification, verified procedure, site/side was marked, verified correct patient position, special equipment/implants available, medications/allergies/relevent history reviewed, required imaging and test results available.  Performed  Drugs Etomidate 20mg , Versed 2mg , Fentanyl IV DL x 1 with GS4 blade Grade 1 view 8.0 ET tube passed through cords under direct visualization Placement confirmed with bilateral breath sounds, positive EtCO2 change and smoke in tube   Evaluation Hemodynamic Status: BP stable throughout; O2 sats: stable throughout Patient's Current Condition: stable Complications: No apparent complications Patient did tolerate procedure well. Chest X-ray ordered to verify placement.  CXR: pending.   [name] 08/11/2018

## 2018-08-11 NOTE — Progress Notes (Signed)
Late Entry:  Clarified orders about suicide precautions. Orders received. Communicated with staffing office and [facility] about sitter needs.
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
## 2018-08-11 NOTE — Progress Notes (Addendum)
Daily Rounding Note  08/11/2018, 8:07 AM  LOS: 2 days   SUBJECTIVE:   Chief complaint:   Swallowed pulse ox probe.    Self extubated yesterday and swallowed pulse ox wire despite police and staff safety monitors.  .    OBJECTIVE:         Vital signs in last 24 hours:    Temp:  [97.6 F (36.4 C)-98.8 F (37.1 C)] 97.8 F (36.6 C) (02/23 0630) Pulse Rate:  [48-79] 52 (02/23 0630) Resp:  [10-21] 14 (02/23 0630) BP: (85-142)/(49-90) 98/61 (02/23 0630) SpO2:  [87 %-100 %] 98 % (02/23 0630) FiO2 (%):  [30 %] 30 % (02/22 1123) Last BM Date: (PTA) Filed Weights   08/08/18 2140 08/09/18 1332 08/10/18 0500  Weight: 97.5 kg 89 kg 82.5 kg   General: healthy, comfortable   Heart: RRR Chest: clear bil.   Abdomen: + BS, firm with palpable surface remodelling from multiple surgical incisions.    Extremities: no CCE Neuro/Psych:  Oriented x 3.  Appropriate.  Moves all 4s.  Limbs restrained.    Intake/Output from previous day: 02/22 0701 - 02/23 0700 In: 4495.6 [I.V.:3690.5; NG/GT:305; IV Piggyback:500.1] Out: 806 [Urine:806]  Intake/Output this shift: No intake/output data recorded.  Lab Results: Recent Labs    08/08/18 2155 08/09/18 1656 08/10/18 0944  WBC 11.4*  --  6.9  HGB 9.9* 9.5* 9.4*  HCT 32.8* 28.0* 31.5*  PLT 265  --  211   BMET Recent Labs    08/08/18 2155 08/09/18 1656 08/10/18 0523  NA 134* 139 140  K 2.8* 3.2* 3.5  CL 98  --  104  CO2 24  --  24  GLUCOSE 95  --  78  BUN 9  --  7  CREATININE 1.38*  --  1.20  CALCIUM 9.2  --  8.7*   LFT Recent Labs    08/08/18 2155  PROT 7.2  ALBUMIN 4.2  AST 30  ALT 21  ALKPHOS 63  BILITOT 0.7   PT/INR Recent Labs    08/10/18 0523 08/11/18 0234  LABPROT 13.4 13.4  INR 1.03 1.03   Hepatitis Panel No results for input(s): HEPBSAG, HCVAB, HEPAIGM, HEPBIGM in the last 72 hours.  Studies/Results: X-ray Chest Pa Or Ap  Result Date:  08/09/2018 CLINICAL DATA:  Swallowed foreign body.  EKG wire lead. EXAM: CHEST  1 VIEW COMPARISON:  August 09, 2018 FINDINGS: The heart size and mediastinal contours are within normal limits. Both lungs are clear. The visualized skeletal structures are unremarkable. No radiopaque foreign bodies noted. IMPRESSION: No definite radiopaque EKG wire lead identified in the esophagus or visualized stomach. Electronically Signed   By: Sherian Rein M.D.   On: 08/09/2018 12:43   Dg Abd 1 View - Kub  Result Date: 08/09/2018 CLINICAL DATA:  Swallowed EKG wire lead EXAM: ABDOMEN - 1 VIEW COMPARISON:  April 18, 2018 FINDINGS: The bowel gas pattern is normal. No definite EKG wire lead is identified. Surgical clips and sutures are identified in right upper quadrant unchanged compared prior exam. IMPRESSION: No EKG wire early noted. Electronically Signed   By: Sherian Rein M.D.   On: 08/09/2018 12:44   Dg Chest Port 1 View  Result Date: 08/10/2018 CLINICAL DATA:  Respiratory failure EXAM: PORTABLE CHEST 1 VIEW COMPARISON:  08/09/2018 FINDINGS: Endotracheal tube terminates 8.4 cm above carina. Nasogastric tube extends beyond the inferior aspect of the film. Patient rotated left. Normal heart size.  Left costophrenic angle excluded. No pleural effusion or pneumothorax. Clear lungs. IMPRESSION: No acute cardiopulmonary disease. Endotracheal tube borderline high in position. Consider advancement 3-4 cm. These results will be called to the ordering clinician or representative by the Radiologist Assistant, and communication documented in the PACS or zVision Dashboard. Electronically Signed   By: Jeronimo Greaves M.D.   On: 08/10/2018 07:55   Scheduled Meds: . heparin  5,000 Units Subcutaneous Q8H  . mouth rinse  15 mL Mouth Rinse BID   Continuous Infusions: . sodium chloride 100 mL/hr at 08/11/18 0500  . dexmedetomidine (PRECEDEX) IV infusion 0.6 mcg/kg/hr (08/11/18 0500)  . dextrose 75 mL/hr at 08/11/18 0500  . sodium  chloride     PRN Meds:.sodium chloride   ASSESMENT:   *  Recurrent swallowing of medical aparatus, pulse ox ingested last night.   Previous same behavior, swallowing rat poison, EKG wires, pulse ox.  Latter removed at EGD 2/21 PT/INR not deranged by rat poison.    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  Dr Kendrick Fries aware.    *  Needs physical restraint of hands 24/7 and probaly additional chemical sedative.  Psych consult pending.    *  Hold sq heparin for EGD, restart this evening.  Jennye Moccasin  08/11/2018, 8:07 AM Phone [phone]    Attending Physician Note   I have taken an interval history, reviewed the chart and examined the patient. I agree with the Advanced Practitioner's note, impression and recommendations. Pt self extubated and then swallowed a pulse ox. Repeatedly, intentionally swallowing medical equipment in hospital during this hospital stay. [employer] in room now. Severe behavioral problems. Recommend deep sedation and intubation for EGD today and continuing until he leaves the hospital. I have communicated my recommendations with Dr. Kendrick Fries.  I discussed GI and endoscopic aspects of mgmt including risks for bowel and esophageal perforation with repeated foreign body ingestions and attempts at endoscopic removal with Dr. Kendrick Fries.  Claudette Head, MD FACG [phone]

## 2018-08-11 NOTE — Progress Notes (Signed)
Received nurse report from [place], transfer of nursing care done. Pt sleeping soundly rr unlabored. 1 male [employer] and 1 male [employer] at pt bedside

## 2018-08-11 NOTE — H&P (View-Only) (Signed)
Daily Rounding Note  08/11/2018, 8:07 AM  LOS: 2 days   SUBJECTIVE:   Chief complaint:   Swallowed pulse ox probe.    Self extubated yesterday and swallowed pulse ox wire despite police and staff safety monitors.  .    OBJECTIVE:         Vital signs in last 24 hours:    Temp:  [97.6 F (36.4 C)-98.8 F (37.1 C)] 97.8 F (36.6 C) (02/23 0630) Pulse Rate:  [48-79] 52 (02/23 0630) Resp:  [10-21] 14 (02/23 0630) BP: (85-142)/(49-90) 98/61 (02/23 0630) SpO2:  [87 %-100 %] 98 % (02/23 0630) FiO2 (%):  [30 %] 30 % (02/22 1123) Last BM Date: (PTA) Filed Weights   08/08/18 2140 08/09/18 1332 08/10/18 0500  Weight: 97.5 kg 89 kg 82.5 kg   General: healthy, comfortable   Heart: RRR Chest: clear bil.   Abdomen: + BS, firm with palpable surface remodelling from multiple surgical incisions.    Extremities: no CCE Neuro/Psych:  Oriented x 3.  Appropriate.  Moves all 4s.  Limbs restrained.    Intake/Output from previous day: 02/22 0701 - 02/23 0700 In: 4495.6 [I.V.:3690.5; NG/GT:305; IV Piggyback:500.1] Out: 806 [Urine:806]  Intake/Output this shift: No intake/output data recorded.  Lab Results: Recent Labs    08/08/18 2155 08/09/18 1656 08/10/18 0944  WBC 11.4*  --  6.9  HGB 9.9* 9.5* 9.4*  HCT 32.8* 28.0* 31.5*  PLT 265  --  211   BMET Recent Labs    08/08/18 2155 08/09/18 1656 08/10/18 0523  NA 134* 139 140  K 2.8* 3.2* 3.5  CL 98  --  104  CO2 24  --  24  GLUCOSE 95  --  78  BUN 9  --  7  CREATININE 1.38*  --  1.20  CALCIUM 9.2  --  8.7*   LFT Recent Labs    08/08/18 2155  PROT 7.2  ALBUMIN 4.2  AST 30  ALT 21  ALKPHOS 63  BILITOT 0.7   PT/INR Recent Labs    08/10/18 0523 08/11/18 0234  LABPROT 13.4 13.4  INR 1.03 1.03   Hepatitis Panel No results for input(s): HEPBSAG, HCVAB, HEPAIGM, HEPBIGM in the last 72 hours.  Studies/Results: X-ray Chest Pa Or Ap  Result Date:  08/09/2018 CLINICAL DATA:  Swallowed foreign body.  EKG wire lead. EXAM: CHEST  1 VIEW COMPARISON:  August 09, 2018 FINDINGS: The heart size and mediastinal contours are within normal limits. Both lungs are clear. The visualized skeletal structures are unremarkable. No radiopaque foreign bodies noted. IMPRESSION: No definite radiopaque EKG wire lead identified in the esophagus or visualized stomach. Electronically Signed   By: Sherian Rein M.D.   On: 08/09/2018 12:43   Dg Abd 1 View - Kub  Result Date: 08/09/2018 CLINICAL DATA:  Swallowed EKG wire lead EXAM: ABDOMEN - 1 VIEW COMPARISON:  April 18, 2018 FINDINGS: The bowel gas pattern is normal. No definite EKG wire lead is identified. Surgical clips and sutures are identified in right upper quadrant unchanged compared prior exam. IMPRESSION: No EKG wire early noted. Electronically Signed   By: Sherian Rein M.D.   On: 08/09/2018 12:44   Dg Chest Port 1 View  Result Date: 08/10/2018 CLINICAL DATA:  Respiratory failure EXAM: PORTABLE CHEST 1 VIEW COMPARISON:  08/09/2018 FINDINGS: Endotracheal tube terminates 8.4 cm above carina. Nasogastric tube extends beyond the inferior aspect of the film. Patient rotated left. Normal heart size.  Left costophrenic angle excluded. No pleural effusion or pneumothorax. Clear lungs. IMPRESSION: No acute cardiopulmonary disease. Endotracheal tube borderline high in position. Consider advancement 3-4 cm. These results will be called to the ordering clinician or representative by the Radiologist Assistant, and communication documented in the PACS or zVision Dashboard. Electronically Signed   By: Kyle  Talbot M.D.   On: 08/10/2018 07:55   Scheduled Meds: . heparin  5,000 Units Subcutaneous Q8H  . mouth rinse  15 mL Mouth Rinse BID   Continuous Infusions: . sodium chloride 100 mL/hr at 08/11/18 0500  . dexmedetomidine (PRECEDEX) IV infusion 0.6 mcg/kg/hr (08/11/18 0500)  . dextrose 75 mL/hr at 08/11/18 0500  . sodium  chloride     PRN Meds:.sodium chloride   ASSESMENT:   *  Recurrent swallowing of medical aparatus, pulse ox ingested last night.   Previous same behavior, swallowing rat poison, EKG wires, pulse ox.  Latter removed at EGD 2/21 PT/INR not deranged by rat poison.    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  Dr McQuaid aware.    *  Needs physical restraint of hands 24/7 and probaly additional chemical sedative.  Psych consult pending.    *  Hold sq heparin for EGD, restart this evening.  .     Sarah Gribbin  08/11/2018, 8:07 AM Phone [phone]    Attending Physician Note   I have taken an interval history, reviewed the chart and examined the patient. I agree with the Advanced Practitioner's note, impression and recommendations. Pt self extubated and then swallowed a pulse ox. Repeatedly, intentionally swallowing medical equipment in hospital during this hospital stay. [place] [employer] in room now. Severe behavioral problems. Recommend deep sedation and intubation for EGD today and continuing until he leaves the hospital. I have communicated my recommendations with Dr. McQuaid.  I discussed GI and endoscopic aspects of mgmt including risks for bowel and esophageal perforation with repeated foreign body ingestions and attempts at endoscopic removal with Dr. McQuaid.  Garison Genova, MD FACG [phone]    

## 2018-08-11 NOTE — Progress Notes (Signed)
[facility]  I spoke to Dr. Koren Shiver who will see the patient in consultation.  Heber , MD [place] [facility] Pager: [phone] Cell: [phone] If no response, call [phone]

## 2018-08-11 NOTE — Op Note (Signed)
[facility] Patient Name: Burdette [place] Procedure Date : 08/11/2018 MRN: [id number] Attending MD: Meryl Dare , MD Date of Birth: 1980/07/27 CSN: [id number] Age: 38 Admit Type: Inpatient Procedure:                Upper GI endoscopy Indications:              Foreign body in the stomach, intentionally swallowed Providers:                Venita Lick. Russella Dar, MD, Roselie Awkward, RN, Kandice Robinsons, Technician Referring MD:             Brooke Pace. Mcquaid, MD Medicines:                Midazolam 5 mg IV Complications:            No immediate complications. Estimated Blood Loss:     Estimated blood loss: none. Procedure:                Pre-Anesthesia Assessment:                           - Prior to the procedure, a History and Physical                            was performed, and patient medications and                            allergies were reviewed. The patient's tolerance of                            previous anesthesia was also reviewed. The risks                            and benefits of the procedure and the sedation                            options and risks were discussed with the patient.                            All questions were answered, and informed consent                            was obtained. Prior Anticoagulants: The patient has                            taken no previous anticoagulant or antiplatelet                            agents. ASA Grade Assessment: II - A patient with                            mild systemic disease. After reviewing the risks  and benefits, the patient was deemed in                            satisfactory condition to undergo the procedure.                            Patient was intubated, sedated in the ICU for the                            procedure.                           After obtaining informed consent, the endoscope was                            passed under  direct vision. Throughout the                            procedure, the patient's blood pressure, pulse, and                            oxygen saturations were monitored continuously. The                            GIF-H190 ([id number]) Olympus gastroscope was                            introduced through the mouth, and advanced to the                            second part of duodenum. The patient tolerated the                            procedure well. The upper GI endoscopy was somewhat                            difficult due foreign body removal. Scope In: Scope Out: Findings:      White wire attached to pulse ox plug was found in the mid esophagus and       in the distal esophagus.      Several healing non-bleeding superficial mucosal tears/abrasions that       were pre-existing (pre-procedure) were found in the proximal esophagus       in the mid esophagus in the distal esophagus. They were small in size       and most were improved compared to last EGD on 2/21 except for those in       the mid esophagus associated with the end of the white wire.      The exam of the esophagus was otherwise normal.      Pulse ox plug with white wire attached were found in the gastric body.       Removal was accomplished with a Raptor grasping device.      The exam of the stomach was otherwise normal.      The duodenal bulb and second portion of the duodenum were normal.  The EGD was repeated after removal of the foreign body to confirm no       residual foreign body and no injury from removal. Impression:               - Pulse ox wire was found in the mid and distal                            esophagus.                           - Superficial mucosal tears/abrasions in the                            proximal esophagus in the mid esophagus in the                            distal esophagus.                           - Pulse ox plug with white wire attached was found                            in  the stomach. Removal was successful.                           - Normal duodenal bulb and second portion of the                            duodenum. Moderate Sedation:      Moderate (conscious) sedation was administered by the endoscopy nurse       and supervised by the endoscopist. The following parameters were       monitored: oxygen saturation, heart rate, blood pressure, respiratory       rate, EKG, adequacy of pulmonary ventilation, and response to care.       Total physician intraservice time was 15 minutes. Recommendation:           - ICU for ongoing care.                           - OK to start enteral tube feeding while intubated.                           - Continue present medications.                           - See progress note for additional recommendations. Procedure Code(s):        --- Professional ---                           [id number], Esophagogastroduodenoscopy, flexible,                            transoral; with removal of foreign body(s)                           99152, 59,  Moderate sedation services provided by                            the same physician or other qualified health care                            professional performing the diagnostic or                            therapeutic service that the sedation supports,                            requiring the presence of an independent trained                            observer to assist in the monitoring of the                            patient's level of consciousness and physiological                            status; initial 15 minutes of intraservice time,                            patient age 19 years or older Diagnosis Code(s):        --- Professional ---                           [phone], Other foreign object in esophagus causing                            other injury, initial encounter                           T18.2XXA, Foreign body in stomach, initial encounter                            S27.818A, Other injury of esophagus (thoracic                            part), initial encounter CPT copyright 2018 [employer]. All rights reserved. The codes documented in this report are preliminary and upon coder review may  be revised to meet current compliance requirements. Meryl Dare, MD 08/11/2018 3:21:11 PM This report has been signed electronically. Number of Addenda: 0

## 2018-08-11 NOTE — Progress Notes (Signed)
[facility]  I spoke to the psychiatrist on staff at behavioral health.  Apparently there is a separate psychiatrist responsible for inpatient consultation at [facility]  The Behavioral health team plans to notify the physician responsible for inpatient consults at [facility] to come to bedside ASAP prior to intubation for pending endoscopy.   Heber Crucible, MD [place] PCCM Pager: [phone] Cell: [phone] If no response, call [phone]

## 2018-08-12 ENCOUNTER — Encounter (HOSPITAL_COMMUNITY): Payer: Self-pay | Admitting: Gastroenterology

## 2018-08-12 DIAGNOSIS — T182XXD Foreign body in stomach, subsequent encounter: Secondary | ICD-10-CM

## 2018-08-12 DIAGNOSIS — Z7289 Other problems related to lifestyle: Secondary | ICD-10-CM

## 2018-08-12 LAB — COMPREHENSIVE METABOLIC PANEL
ALK PHOS: 44 U/L (ref 38–126)
ALT: 13 U/L (ref 0–44)
ANION GAP: 7 (ref 5–15)
AST: 15 U/L (ref 15–41)
Albumin: 2.8 g/dL — ABNORMAL LOW (ref 3.5–5.0)
BUN: 5 mg/dL — ABNORMAL LOW (ref 6–20)
CALCIUM: 8.1 mg/dL — AB (ref 8.9–10.3)
CO2: 22 mmol/L (ref 22–32)
Chloride: 110 mmol/L (ref 98–111)
Creatinine, Ser: 1.15 mg/dL (ref 0.61–1.24)
GFR calc Af Amer: >60 mL/min (ref >60)
GFR calc non Af Amer: >60 mL/min (ref >60)
Glucose, Bld: 83 mg/dL (ref 70–99)
Potassium: 3.8 mmol/L (ref 3.5–5.1)
SODIUM: 139 mmol/L (ref 135–145)
Total Bilirubin: 0.8 mg/dL (ref 0.3–1.2)
Total Protein: 5.2 g/dL — ABNORMAL LOW (ref 6.5–8.1)

## 2018-08-12 LAB — GLUCOSE, CAPILLARY
GLUCOSE-CAPILLARY: 77 mg/dL (ref 70–99)
Glucose-Capillary: 100 mg/dL — ABNORMAL HIGH (ref 70–99)
Glucose-Capillary: 77 mg/dL (ref 70–99)
Glucose-Capillary: 86 mg/dL (ref 70–99)
Glucose-Capillary: 96 mg/dL (ref 70–99)
Glucose-Capillary: 97 mg/dL (ref 70–99)

## 2018-08-12 LAB — PROTIME-INR
INR: 1.16
Prothrombin Time: 14.7 seconds (ref 11.4–15.2)

## 2018-08-12 MED ORDER — VITAL 1.5 CAL PO LIQD
1000.0000 mL | ORAL | Status: DC
  Filled 2018-08-12: qty 1000

## 2018-08-12 MED ORDER — ACETAMINOPHEN 500 MG PO TABS
500.0000 mg | ORAL_TABLET | Freq: Four times a day (QID) | ORAL | Status: DC | PRN
  Administered 2018-08-12: 500 mg via ORAL
  Filled 2018-08-12: qty 1

## 2018-08-12 MED ORDER — CHLORHEXIDINE GLUCONATE CLOTH 2 % EX PADS
6.0000 | MEDICATED_PAD | Freq: Every day | CUTANEOUS | Status: DC
  Administered 2018-08-12 – 2018-08-13 (×2): 6 via TOPICAL

## 2018-08-12 MED ORDER — PRO-STAT SUGAR FREE PO LIQD: 30.0000 mL | Freq: Two times a day (BID) | ORAL | Status: DC

## 2018-08-12 MED ORDER — PHENOL 1.4 % MT LIQD
1.0000 | OROMUCOSAL | Status: DC | PRN
  Filled 2018-08-12: qty 177

## 2018-08-12 MED ORDER — MUPIROCIN 2 % EX OINT
1.0000 "application " | TOPICAL_OINTMENT | Freq: Two times a day (BID) | CUTANEOUS | Status: DC
  Administered 2018-08-12 – 2018-08-13 (×2): 1 via NASAL
  Filled 2018-08-12: qty 22

## 2018-08-12 MED ORDER — DIPHENHYDRAMINE HCL 25 MG PO CAPS
25.0000 mg | ORAL_CAPSULE | Freq: Four times a day (QID) | ORAL | Status: DC | PRN
  Administered 2018-08-12 – 2018-08-13 (×2): 25 mg via ORAL
  Filled 2018-08-12 (×2): qty 1

## 2018-08-12 NOTE — Social Work (Signed)
CSW consulted to assist with IVC. MD completed IVC petition and examination. Officers on site signed and served IVC paperwork. Originals and copies of paperwork placed on patient's chart.  Abigail Butts, LCSW [phone]
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
## 2018-08-12 NOTE — Progress Notes (Signed)
1 male [employer] and 1 male [employer] at pt bedside. Suicide sitter at pt bedside. Pt remains sedated. Pt remains orally intubated and in 4 point soft restraints.

## 2018-08-12 NOTE — Progress Notes (Signed)
Initial Nutrition Assessment  DOCUMENTATION CODES:   Not applicable  INTERVENTION:   Tube feeding:  -Vital 1.5 @ 55 ml/hr (1320 ml) via OGT -30 ml Prostat BID  Provides: 2180 kcals, 119 grams protein, 1008 ml free water. Meets 100% of needs.   NUTRITION DIAGNOSIS:   Inadequate oral intake related to acute illness as evidenced by NPO status.  GOAL:   Patient will meet greater than or equal to 90% of their needs  MONITOR:   Diet advancement, Vent status, TF tolerance, Weight trends, Labs, I & O's  REASON FOR ASSESSMENT:   Consult Enteral/tube feeding initiation and management  ASSESSMENT:   Patient with PMH significant for cocaine abuse, borderline personality disorder, anxiety, and depression. Presents this admission after swallowing rat poison while using cocaine.    2/21- swallowed two EKG leads, wires, O2 probe, removed by endoscopy-intubated 2/22- self extubated, swallowed 2nd O2 probe 2/23- probe removed by endoscopy- left intubated  RN reports possibility for extubation today. Pt alert and oriented on the vent, in four point restraints. RN wishes for tube feeding to be ordered in case he is unable to extubate. CBGs running 77 on D5.   No family at bedside to provide history. Pt denies he had unintentional wt loss PTA. Records show pt weighed 209 lb on 1/8 and 182 lb this admission (12.9% wt loss in 1 month, significant for time frame). Unable to diagnosis malnutrition without recall.   Patient is currently intubated on ventilator support MV: 16.0 L/min Temp (24hrs), Avg:98.3 F (36.8 C), Min:97.9 F (36.6 C), Max:98.6 F (37 C) BP: 129/66 MAP: 83  I/O: +7539 ml since admit UOP: 1400 ml x 24 hrs   Medications reviewed and include: NS @ 100 ml/hr, D5 @ 75 ml/hr, versed Labs reviewed: CBG 72-111  NUTRITION - FOCUSED PHYSICAL EXAM:    Most Recent Value  Orbital Region  No depletion  Upper Arm Region  No depletion  Thoracic and Lumbar Region  Unable to  assess  Buccal Region  Unable to assess  Temple Region  No depletion  Clavicle Bone Region  No depletion  Clavicle and Acromion Bone Region  No depletion  Scapular Bone Region  Unable to assess  Dorsal Hand  Unable to assess [mits]  Patellar Region  No depletion  Anterior Thigh Region  No depletion  Posterior Calf Region  No depletion  Edema (RD Assessment)  None  Hair  Reviewed  Eyes  Reviewed  Mouth  Unable to assess  Skin  Reviewed  Nails  Unable to assess     Diet Order:   Diet Order            Diet NPO time specified  Diet effective now              EDUCATION NEEDS:   Not appropriate for education at this time  Skin:  Skin Assessment: Reviewed RN Assessment  Last BM:  PTA  Height:   Ht Readings from Last 1 Encounters:  08/08/18 6\' 2"  (1.88 m)    Weight:   Wt Readings from Last 1 Encounters:  08/10/18 82.5 kg    Ideal Body Weight:  86.4 kg  BMI:  Body mass index is 23.35 kg/m.  Estimated Nutritional Needs:   Kcal:  2182 kcal  Protein:  115-130 grams  Fluid:  >/= 2 L/day   Vanessa Kick RD, LDN Clinical Nutrition Pager # - [phone]

## 2018-08-12 NOTE — Progress Notes (Signed)
Extubation Procedure Note  Patient Details:   Name: David Mcconnell DOB: [birth year] MRN: [id number]   Airway Documentation:    Vent end date: 08/10/18 Vent end time: 1200   Evaluation  O2 sats: stable throughout Complications: No apparent complications Patient did tolerate procedure well. Bilateral Breath Sounds: Clear   Yes  Patient soon thereafter began trying to hold his breath and states "I want to die" and "now I'm going to prison for the rest of my life", non- rebreather used as he continues to hold his breath periodically.  Casper Harrison Mikenzi Raysor RN

## 2018-08-12 NOTE — Procedures (Signed)
Extubation Procedure Note  Patient Details:   Name: David Mcconnell DOB: 10/24/1980 MRN: [id number]   Airway Documentation:    Vent end date: 08/10/18 Vent end time: 1200   Evaluation  O2 sats: stable throughout Complications: No apparent complications Patient did tolerate procedure well. Bilateral Breath Sounds: Clear   Yes   Pt extubated to room air due to safety concerns with cannula. SpO2 on RA 97%. Pt continues to hold his breath and desat   Derinda Late 08/12/2018, 12:32 PM

## 2018-08-12 NOTE — Progress Notes (Signed)
NAME:  David Mcconnell, MRN:  [id number], DOB:  1980-12-16, LOS: 3 ADMISSION DATE:  08/08/2018, CONSULTATION DATE:  08/09/2018 REFERRING MD:  Russella Dar, CHIEF COMPLAINT:   Intentional Foreign Body Ingestion   Brief History   38 y/o male admitted from jail after swallowing rat poison.  He then proceeded to swallow telemetry leads in the ER.  These were removed by endoscopy.  In recovery in the endoscopy suite he swallowed an O2 saturation probe.  He was left intubated and brought to the ICU on 2/21.  He self extubated on 2/22 and later swallowed another O2 saturation probe.  All of this occurred even with safety staff monitors and [facility] at the bedside.     Past Medical History  Cocaine abuse, borderline personality disorder, anxiety, depression  Significant Hospital Events   2/21 admitted from jail after swallowing rat poison.  He then proceeded to swallow telemetry leads in the ER.  These were removed by endoscopy.  In recovery in the endoscopy suite he swallowed an O2 saturation probe.  He was left intubated and brought to the ICU on 2/21 2/22 self extubated; swallowed an O2 saturation probe 2/23 reintubated for UGD   Consults:  GI PCCM Psychiatry  Procedures:  2/21 EGD: removed telemetry leads 2/21 EGD: removed O2 saturation probe 2/21 ETT > left intubated>>self extubated  2/23 ETT>>>  Significant Diagnostic Tests:    Micro Data:    Antimicrobials:     Interim history/subjective:  re intubated yesterday for EGD after swallowing another O2 sat probe immediately after self extubating despite multiple staff and [employer] at bedside.   Objective   Blood pressure 129/66, pulse 72, temperature 98.3 F (36.8 C), resp. rate 10, height 6\' 2"  (1.88 m), weight 82.5 kg, SpO2 100 %.    Vent Mode: PRVC FiO2 (%):  [40 %-100 %] 40 % Set Rate:  [14 bmp] 14 bmp Vt Set:  [650 mL] 650 mL PEEP:  [5 cmH20] 5 cmH20 Plateau Pressure:  [7 cmH20-18 cmH20] 7 cmH20   Intake/Output Summary  (Last 24 hours) at 08/12/2018 6213 Last data filed at 08/12/2018 0700 Gross per 24 hour  Intake 4505.97 ml  Output 1400 ml  Net 3105.97 ml   Filed Weights   08/08/18 2140 08/09/18 1332 08/10/18 0500  Weight: 97.5 kg 89 kg 82.5 kg    Examination:  General:  Resting comfortably in bed on vent  HENT: NCAT OP clear PULM: resps even non labored on vent, apneic on SBT - appears to be purposely holding his breath  CV: RRR, no mgr GI: BS+, soft, nontender MSK: warm and dry, multiple tattoos, normal bulk and tone Neuro: drowsy but easily wakes, follows commands, on dilaudid, versed gtts   Resolved Hospital Problem list     Assessment & Plan:  Ingestion of foreign body: multiple intentional episodes  S/p repeat EGD 2/23 PLAN -  GI following    Intentional self arm, repeated: he has demonstrated repeatedly that he is a risk to himself PLAN -  Psych to see but have been unable to adequately interview him r/t vent needs  Concern that he will self-harm immediately after extubation  Continue safety sitter AND [employer]  Suspect he will need [employer] to handcuff him after extubation - he will remain in their custody on d/c but will need some reasonable period of observation post extubation prior to d/c    Intentional ingestion of rat poison: fortunately no lab works signs or symptoms of bleeding dyscrasia > PT/INR  remains within normal limits PLAN -  Monitor cbc, cmet daily  2/24 will complete 72 hour of observation since ingestion   Vent needs - as above.   PLAN -  Vent support - 8cc/kg  F/u CXR  Wean sedation  PS wean as tol  Need to coordinate extubation with repeat psych eval   Best practice:  Diet: NPO - advance giet per GI recs  Pain/Anxiety/Delirium protocol (if indicated): PAD protocol after intubation VAP protocol (if indicated): yes DVT prophylaxis: Sub q hep GI prophylaxis: N/A Glucose control: monitor Mobility: bed rest Code Status: full Family Communication: none  bedside 2/24 Disposition: remain in ICU  Labs   CBC: Recent Labs  Lab 08/08/18 2155 08/09/18 1656 08/10/18 0944 08/11/18 1432  WBC 11.4*  --  6.9  --   NEUTROABS 10.0*  --   --   --   HGB 9.9* 9.5* 9.4* 9.2*  HCT 32.8* 28.0* 31.5* 27.0*  MCV 78.5*  --  79.5*  --   PLT 265  --  211  --     Basic Metabolic Panel: Recent Labs  Lab 08/08/18 2155 08/09/18 1656 08/09/18 1808 08/10/18 0523 08/11/18 0748 08/11/18 1432 08/12/18 0317  NA 134* 139  --  140 139 138 139  K 2.8* 3.2*  --  3.5 3.7 3.7 3.8  CL 98  --   --  104 109  --  110  CO2 24  --   --  24 23  --  22  GLUCOSE 95  --   --  78 120*  --  83  BUN 9  --   --  7 7  --  5*  CREATININE 1.38*  --   --  1.20 1.11  --  1.15  CALCIUM 9.2  --   --  8.7* 8.6*  --  8.1*  MG  --   --  2.5* 2.4  --   --   --   PHOS  --   --  2.8 4.2  --   --   --    GFR: Estimated Creatinine Clearance: 103.2 mL/min (by C-G formula based on SCr of 1.15 mg/dL). Recent Labs  Lab 08/08/18 2155 08/10/18 0944  WBC 11.4* 6.9    Liver Function Tests: Recent Labs  Lab 08/08/18 2155 08/11/18 0748 08/12/18 0317  AST ALT ALKPHOS 63 48 44  BILITOT 0.7 0.5 0.8  PROT 7.2 5.6* 5.2*  ALBUMIN 4.2 3.0* 2.8*   No results for input(s): LIPASE, AMYLASE in the last 168 hours. No results for input(s): AMMONIA in the last 168 hours.  ABG    Component Value Date/Time   PHART 7.411 08/11/2018 1432   PCO2ART 36.8 08/11/2018 1432   PO2ART 47.0 (L) 08/11/2018 1432   HCO3 23.4 08/11/2018 1432   TCO2 24 08/11/2018 1432   ACIDBASEDEF 1.0 08/11/2018 1432   O2SAT 83.0 08/11/2018 1432     Coagulation Profile: Recent Labs  Lab 08/08/18 2155 08/09/18 0953 08/10/18 0523 08/11/18 0234 08/12/18 0317  INR 1.11 1.09 1.03 1.03 1.16    Cardiac Enzymes: No results for input(s): CKTOTAL, CKMB, CKMBINDEX, TROPONINI in the last 168 hours.  HbA1C: No results found for: HGBA1C  CBG: Recent Labs  Lab 08/11/18 1542 08/11/18 1940  08/11/18 2301 08/12/18 0323 08/12/18 0729  GLUCAP 100* 100* 72 77 77     Critical care time: 35 minutes      Dirk Dress, NP 08/12/2018  9:25 AM Pager: [phone] or [phone]

## 2018-08-13 LAB — BASIC METABOLIC PANEL
Anion gap: 12 (ref 5–15)
CO2: 22 mmol/L (ref 22–32)
Calcium: 8.7 mg/dL — ABNORMAL LOW (ref 8.9–10.3)
Chloride: 105 mmol/L (ref 98–111)
Creatinine, Ser: 0.92 mg/dL (ref 0.61–1.24)
GFR calc Af Amer: >60 mL/min (ref >60)
GFR calc non Af Amer: >60 mL/min (ref >60)
Glucose, Bld: 88 mg/dL (ref 70–99)
Potassium: 3.5 mmol/L (ref 3.5–5.1)
Sodium: 139 mmol/L (ref 135–145)

## 2018-08-13 LAB — CBC
HCT: 30.3 % — ABNORMAL LOW (ref 39.0–52.0)
Hemoglobin: 8.9 g/dL — ABNORMAL LOW (ref 13.0–17.0)
MCH: 23.3 pg — AB (ref 26.0–34.0)
MCHC: 29.4 g/dL — ABNORMAL LOW (ref 30.0–36.0)
MCV: 79.3 fL — ABNORMAL LOW (ref 80.0–100.0)
Platelets: 199 10*3/uL (ref 150–400)
RBC: 3.82 MIL/uL — AB (ref 4.22–5.81)
RDW: 15.1 % (ref 11.5–15.5)
WBC: 5.6 10*3/uL (ref 4.0–10.5)
nRBC: 0 % (ref 0.0–0.2)

## 2018-08-13 LAB — GLUCOSE, CAPILLARY: GLUCOSE-CAPILLARY: 87 mg/dL (ref 70–99)

## 2018-08-13 MED ORDER — HYDROXYZINE HCL 10 MG PO TABS
10.0000 mg | ORAL_TABLET | Freq: Three times a day (TID) | ORAL | Status: DC | PRN
  Administered 2018-08-13 (×2): 10 mg via ORAL
  Filled 2018-08-13 (×3): qty 1

## 2018-08-13 NOTE — Progress Notes (Signed)
Patient reports swallowing IV that was in right forearm last night, provider notified, advises IV is a small piece of plastic and will pass, okay to discharge.

## 2018-08-13 NOTE — Progress Notes (Signed)
   08/13/18 1034  Urine Characteristics  Time patient last voided or urinary catheter emptied 0540  Urinary Interventions Bladder scan  Bladder Scan Volume (mL) 815 mL  Patient reports sudden inability to urinate, bladder scan reported to providers.

## 2018-08-13 NOTE — Discharge Summary (Addendum)
Physician Discharge Summary         Patient ID: CASSEY HURRELL MRN: [id number] DOB/AGE: [birth year] 37 y.o.  Admit date: 08/08/2018 Discharge date: 08/13/2018  Discharge Diagnoses:   Self reported ingestion of rat poison>> work up unremarkable Swallowed Foreign Body 08/08/2018>> removed 2/24    Discharge summary    Pt. was admitted 08/08/2018 after self report of swallowing rat poison . At the time of his self reporting, the patient was under arrest and in the custody of [facility]. Work up was unremarkable, UDS was + for cocaine. Hospitalization was complicated by intentional ingestion of EEG leads and sat probe monitor. He was intubated for endoscopic removal of the foreign bodies and has subsequently successfully  extubated 2/24. He is stable on RA with sats of 100%, lungs are clear. He has been cleared by Psychiatry. He is ready for discharge to the [facility] with the police officers who are currently maintaining custody at bedside.  Discharge Plan by Active Problems   Self reporting of Intentional ingestion of rat poison with  no lab work or bleeding problems consistent with this. PT and INR have remained normal. VS stable  Self ingestion of EKG leads and saturation probe Intubated and removed endoscopically 2022/09/10. Eating without issue  Healing non-bleeding superficial mucosal tears/abrasions that were pre-existing (pre-endoscopy) were found in the proximal esophagus in the mid esophagus in the distal esophagus. They were small in size and most were improved compared to last EGD on 2/21 Continue Protonix  Successful extubation 2/24 Saturations are 100 % on RA. CXR without acute process  No current suicidal Ideation Cleared by psychiatry 2.24 ( See Note)  Medically stable for discharge    Significant Hospital tests/ studies   Procedures   2/21, and 2022-09-10 EGD Culture data/antimicrobials      Consults  GI Psych    Discharge Exam: BP 118/77    Pulse 69   Temp 98.3 F (36.8 C) (Axillary)   Resp 14   Ht  (1.88 m)   Wt 84.8 kg   SpO2 100%   BMI 24.00 kg/m    General:  Awake and alert male, supine in bed on RA HEENT: MM pink/moist Neuro: MAE x 4, A&O x 3 CV: s1s2 rrr, no m/r/g PULM: even/non-labored, lungs bilaterally clear.  WU:JWJX, non-tender, bsx4 active  Extremities: warm/dry, no  edema , no obvious deformities Skin: no rashes or lesions, warm and dry Labs at discharge   Lab Results  Component Value Date   CREATININE 0.92 08/13/2018   BUN <5 (L) 08/13/2018   NA 139 08/13/2018   K 3.5 08/13/2018   CL 105 08/13/2018   CO2 22 08/13/2018   Lab Results  Component Value Date   WBC 5.6 08/13/2018   HGB 8.9 (L) 08/13/2018   HCT 30.3 (L) 08/13/2018   MCV 79.3 (L) 08/13/2018   PLT 199 08/13/2018   Lab Results  Component Value Date   ALT 13 08/12/2018   AST 15 08/12/2018   ALKPHOS 44 08/12/2018   BILITOT 0.8 08/12/2018   Lab Results  Component Value Date   INR 1.16 08/12/2018   INR 1.03 09-10-18   INR 1.03 08/10/2018    Current radiological studies    Dg Chest 1 View  Result Date: 2018-09-10 CLINICAL DATA:  OG tube placement. EXAM: CHEST  1 VIEW COMPARISON:  10-Sep-2018 FINDINGS: Endotracheal tube is 5 cm above the carina. OG tube enters the stomach. Heart is normal size. Lungs clear.  No effusions or acute bony abnormality. IMPRESSION: No acute cardiopulmonary disease. Electronically Signed   By: Charlett Nose M.D.   On: 08/11/2018 18:11   Dg Abd 1 View  Result Date: 08/11/2018 CLINICAL DATA:  OG tube placement EXAM: ABDOMEN - 1 VIEW COMPARISON:  08/11/2018 FINDINGS: OG tube tip is in the mid stomach. Mild gaseous distention of bowel, predominantly colon. IMPRESSION: OG tube tip in the mid stomach. Electronically Signed   By: Charlett Nose M.D.   On: 08/11/2018 18:11   Dg Chest Port 1 View  Result Date: 08/11/2018 CLINICAL DATA:  The patient swallowed a pulse ox cord today. Status post intubation  today. EXAM: PORTABLE CHEST 1 VIEW COMPARISON:  Single-view of the chest earlier today. FINDINGS: Endotracheal tube is in place with the tip in good position at the level of the clavicular heads. Lungs clear. Heart size normal. Pulse ox cord is again seen in the esophagus and coursing into the stomach. IMPRESSION: ETT in good position. Hole sites cord in the esophagus and stomach is unchanged. Lungs clear. Electronically Signed   By: Drusilla Kanner M.D.   On: 08/11/2018 14:56    Disposition:   To [facility] with  police who are currently maintaining custody.  Discharge disposition: [facility] Not Defined       Discharge Instructions    Call MD for:  difficulty breathing, headache or visual disturbances   Complete by:  As directed    Call MD for:  persistant nausea and vomiting   Complete by:  As directed    Call MD for:  severe uncontrolled pain   Complete by:  As directed    Call MD for:  temperature >100.4   Complete by:  As directed    Diet - low sodium heart healthy   Complete by:  As directed    Discharge instructions   Complete by:  As directed    Slowly advance activity.   Increase activity slowly   Complete by:  As directed       Allergies as of 08/13/2018      Reactions   Tegretol [carbamazepine] Rash, Other (See Comments)   Blisters in mouth   Tegretol [carbamazepine] Rash      Medication List    STOP taking these medications   acetaminophen 500 MG tablet Commonly known as:  TYLENOL     TAKE these medications   buPROPion 150 MG 24 hr tablet Commonly known as:  WELLBUTRIN XL Take 1 tablet (150 mg total) by mouth daily. What changed:  how much to take   gabapentin 400 MG capsule Commonly known as:  NEURONTIN Take 1 capsule (400 mg total) by mouth 3 (three) times daily.   lamoTRIgine 25 MG tablet Commonly known as:  LAMICTAL Take 25 mg by mouth 2 (two) times daily.   lurasidone 20 MG Tabs tablet Commonly known as:   LATUDA Take 20 mg by mouth daily with breakfast.   pantoprazole 20 MG tablet Commonly known as:  PROTONIX Take 1 tablet (20 mg total) by mouth daily.       Follow-up appointment    per [facility] Medical Team Discharge Condition:   Pt. Has achieved maximal in patient benefit and is medically stable for discharge. He has been cleared by psychiatry as safe for discharge.   Physician Statement:   The Patient was personally examined, the discharge assessment and plan has been personally reviewed and I agree with ACNP Groce's assessment and  plan. 40  minutes of time have been dedicated to discharge assessment, planning and discharge instructions.   Signed: Bevelyn Ngo, AGACNP-BC [facility] 08/13/2018, 11:11 AM   PCCM:  Patient seen and examined prior to discharge.  Patient is medically stable for discharge from the intensive care unit back to the custody of [place] city police.  He has continued ongoing statements that he could not urinate however has urinated twice this morning per nursing staff on his own.  He is also made statements that he has continued to try to swallow things.  He has been under 24-hour observation by bedside nursing as well as police officers and a sitter also in four-point restraints.  Patient has been psychiatrically cleared for discharge.  Please review Dr. Kirstie Peri consultation note on 08/12/2018.  She believes all of these statements of self-harm are in effort to prevent confinement in jail.  General appearance: 38 y.o., male, NAD, conversant  Eyes: anicteric sclerae, moist conjunctivae; no lid-lag; PERRLA, tracking appropriately HENT: NCAT; oropharynx, MMM, no mucosal ulcerations; normal hard and soft palate Neck: Trachea midline; FROM, supple, lymphadenopathy, no JVD Lungs: CTAB, no crackles, no wheeze, with normal respiratory effort and no intercostal retractions CV: RRR, S1, S2, no MRGs  Abdomen: Soft, non-tender;  non-distended, BS present  Extremities: No peripheral edema, radial and DP pulses present bilaterally  Skin: Multiple tattoos Psych: Flat affect, whispering under his breath Neuro: Alert and oriented to person and place, no focal deficit   Assessment: Self ingestion for potential self-harm and prevention of confinement in jail. Plan: All foreign bodies presumptively have been extracted via EGD and patient has been under observation for the past 24 hours post extubation. Discharge back into the custody of [place] city police.  Josephine Igo, DO [facility] 08/13/2018 2:09 PM  Personal pager: [phone] If unanswered, please page CCM On-call: #[phone]

## 2018-08-13 NOTE — Progress Notes (Signed)
eLink Physician-Brief Progress Note Patient Name: David Mcconnell DOB: Nov 07, 1980 MRN: [id number]   Date of Service  08/13/2018  HPI/Events of Note  Itching - No relief with Benadryl.  eICU Interventions  Will order: 1. Hydroxyzine 10 mg PO TID PRN itching.  2. D/C Benadryl     Intervention Category Major Interventions: Other:  Benton Tooker Dennard Nip 08/13/2018, 1:56 AM

## 2018-08-13 NOTE — Progress Notes (Addendum)
David Mcconnell to be D/C'd [employer] per MD order via police escort. Discussed with the patient and all questions fully answered.  VSS, Skin clean, dry and intact without evidence of skin break down, no evidence of skin tears noted. IV catheter discontinued intact. Site without signs and symptoms of complications. Dressing and pressure applied.  Bilateral wrist and ankle restraints removed, officers replaced with handcuffs. No evidence of breakdown.   An After Visit Summary was printed and given to the officers.   D/c education completed with patient/family including follow up instructions, medication list, d/c activities limitations if indicated, with other d/c instructions as indicated by MD - patient able to verbalize understanding, all questions fully answered.   Patient instructed to return to ED, call 911, or call MD for any changes in condition.   Clothing in bad and given to officers.   Patient escorted via [facility] to ED entrance with police officers.  [name] 08/13/2018 12:12 PM

## 2018-08-14 ENCOUNTER — Inpatient Hospital Stay (HOSPITAL_COMMUNITY)

## 2018-08-14 ENCOUNTER — Encounter (HOSPITAL_COMMUNITY): Payer: Self-pay

## 2018-08-14 ENCOUNTER — Emergency Department (HOSPITAL_COMMUNITY): Admitting: Certified Registered"

## 2018-08-14 ENCOUNTER — Encounter (HOSPITAL_COMMUNITY): Admission: EM | Disposition: A | Discharge status: Home / Self Care | Payer: Self-pay

## 2018-08-14 ENCOUNTER — Inpatient Hospital Stay (HOSPITAL_COMMUNITY)
Admission: EM | Admit: 2018-08-14 | Discharge: 2018-08-25 | DRG: 329 | Disposition: A | Discharge status: Home / Self Care | Attending: Surgery | Admitting: Surgery

## 2018-08-14 ENCOUNTER — Other Ambulatory Visit: Payer: Self-pay

## 2018-08-14 DIAGNOSIS — X838XXA Intentional self-harm by other specified means, initial encounter: Secondary | ICD-10-CM

## 2018-08-14 DIAGNOSIS — S36499A Other injury of unspecified part of small intestine, initial encounter: Principal | ICD-10-CM | POA: Diagnosis present

## 2018-08-14 DIAGNOSIS — Z915 Personal history of self-harm: Secondary | ICD-10-CM

## 2018-08-14 DIAGNOSIS — T189XXA Foreign body of alimentary tract, part unspecified, initial encounter: Secondary | ICD-10-CM | POA: Diagnosis not present

## 2018-08-14 DIAGNOSIS — F419 Anxiety disorder, unspecified: Secondary | ICD-10-CM | POA: Diagnosis present

## 2018-08-14 DIAGNOSIS — S31633A Puncture wound without foreign body of abdominal wall, right lower quadrant with penetration into peritoneal cavity, initial encounter: Secondary | ICD-10-CM | POA: Diagnosis present

## 2018-08-14 DIAGNOSIS — S31119A Laceration without foreign body of abdominal wall, unspecified quadrant without penetration into peritoneal cavity, initial encounter: Secondary | ICD-10-CM | POA: Diagnosis present

## 2018-08-14 DIAGNOSIS — Y92149 Unspecified place in prison as the place of occurrence of the external cause: Secondary | ICD-10-CM | POA: Diagnosis not present

## 2018-08-14 DIAGNOSIS — F603 Borderline personality disorder: Secondary | ICD-10-CM | POA: Diagnosis present

## 2018-08-14 DIAGNOSIS — K660 Peritoneal adhesions (postprocedural) (postinfection): Secondary | ICD-10-CM | POA: Diagnosis present

## 2018-08-14 DIAGNOSIS — S27819A Unspecified injury of esophagus (thoracic part), initial encounter: Secondary | ICD-10-CM

## 2018-08-14 DIAGNOSIS — T18198A Other foreign object in esophagus causing other injury, initial encounter: Secondary | ICD-10-CM | POA: Diagnosis not present

## 2018-08-14 DIAGNOSIS — T182XXA Foreign body in stomach, initial encounter: Secondary | ICD-10-CM | POA: Diagnosis not present

## 2018-08-14 DIAGNOSIS — K208 Other esophagitis: Secondary | ICD-10-CM | POA: Diagnosis not present

## 2018-08-14 DIAGNOSIS — Z9889 Other specified postprocedural states: Secondary | ICD-10-CM

## 2018-08-14 DIAGNOSIS — R0602 Shortness of breath: Secondary | ICD-10-CM

## 2018-08-14 DIAGNOSIS — Z22322 Carrier or suspected carrier of Methicillin resistant Staphylococcus aureus: Secondary | ICD-10-CM

## 2018-08-14 DIAGNOSIS — F329 Major depressive disorder, single episode, unspecified: Secondary | ICD-10-CM | POA: Diagnosis present

## 2018-08-14 DIAGNOSIS — F17210 Nicotine dependence, cigarettes, uncomplicated: Secondary | ICD-10-CM | POA: Diagnosis present

## 2018-08-14 DIAGNOSIS — Z0189 Encounter for other specified special examinations: Secondary | ICD-10-CM

## 2018-08-14 DIAGNOSIS — IMO0002 Reserved for concepts with insufficient information to code with codable children: Secondary | ICD-10-CM

## 2018-08-14 DIAGNOSIS — Z7289 Other problems related to lifestyle: Secondary | ICD-10-CM

## 2018-08-14 DIAGNOSIS — R109 Unspecified abdominal pain: Secondary | ICD-10-CM

## 2018-08-14 DIAGNOSIS — Z419 Encounter for procedure for purposes other than remedying health state, unspecified: Secondary | ICD-10-CM

## 2018-08-14 HISTORY — PX: LAPAROTOMY: SHX154

## 2018-08-14 LAB — BASIC METABOLIC PANEL
Anion gap: 10 (ref 5–15)
BUN: 5 mg/dL — ABNORMAL LOW (ref 6–20)
CO2: 26 mmol/L (ref 22–32)
Calcium: 9.3 mg/dL (ref 8.9–10.3)
Chloride: 105 mmol/L (ref 98–111)
Creatinine, Ser: 0.94 mg/dL (ref 0.61–1.24)
GFR calc Af Amer: >60 mL/min (ref >60)
GFR calc non Af Amer: >60 mL/min (ref >60)
Glucose, Bld: 85 mg/dL (ref 70–99)
Potassium: 3.4 mmol/L — ABNORMAL LOW (ref 3.5–5.1)
Sodium: 141 mmol/L (ref 135–145)

## 2018-08-14 LAB — CBC WITH DIFFERENTIAL/PLATELET
Abs Immature Granulocytes: 0.02 10*3/uL (ref 0.00–0.07)
Basophils Absolute: 0 10*3/uL (ref 0.0–0.1)
Basophils Relative: 0 %
Eosinophils Absolute: 0.1 10*3/uL (ref 0.0–0.5)
Eosinophils Relative: 2 %
HCT: 33.8 % — ABNORMAL LOW (ref 39.0–52.0)
Hemoglobin: 10 g/dL — ABNORMAL LOW (ref 13.0–17.0)
Immature Granulocytes: 0 %
Lymphocytes Relative: 17 %
Lymphs Abs: 1.1 10*3/uL (ref 0.7–4.0)
MCH: 23.5 pg — ABNORMAL LOW (ref 26.0–34.0)
MCHC: 29.6 g/dL — ABNORMAL LOW (ref 30.0–36.0)
MCV: 79.3 fL — ABNORMAL LOW (ref 80.0–100.0)
Monocytes Absolute: 0.6 10*3/uL (ref 0.1–1.0)
Monocytes Relative: 9 %
Neutro Abs: 4.7 10*3/uL (ref 1.7–7.7)
Neutrophils Relative %: 72 %
PLATELETS: 255 10*3/uL (ref 150–400)
RBC: 4.26 MIL/uL (ref 4.22–5.81)
RDW: 15.1 % (ref 11.5–15.5)
WBC: 6.5 10*3/uL (ref 4.0–10.5)
nRBC: 0 % (ref 0.0–0.2)

## 2018-08-14 LAB — TYPE AND SCREEN
ABO/RH(D): O POS
Antibody Screen: NEGATIVE

## 2018-08-14 SURGERY — LAPAROTOMY, EXPLORATORY
Anesthesia: General | Site: Abdomen

## 2018-08-14 MED ORDER — SUCCINYLCHOLINE CHLORIDE 200 MG/10ML IV SOSY
PREFILLED_SYRINGE | INTRAVENOUS | Status: AC
  Filled 2018-08-14: qty 10

## 2018-08-14 MED ORDER — ONDANSETRON HCL 4 MG/2ML IJ SOLN
INTRAMUSCULAR | Status: DC | PRN
  Administered 2018-08-14: 4 mg via INTRAVENOUS

## 2018-08-14 MED ORDER — ENOXAPARIN SODIUM 40 MG/0.4ML ~~LOC~~ SOLN
40.0000 mg | SUBCUTANEOUS | Status: DC
  Administered 2018-08-15 – 2018-08-23 (×7): 40 mg via SUBCUTANEOUS
  Filled 2018-08-14 (×9): qty 0.4

## 2018-08-14 MED ORDER — HYDROMORPHONE HCL 1 MG/ML IJ SOLN
0.5000 mg | INTRAMUSCULAR | Status: DC | PRN
  Administered 2018-08-14 – 2018-08-17 (×12): 0.5 mg via INTRAVENOUS
  Filled 2018-08-14 (×12): qty 1

## 2018-08-14 MED ORDER — DEXAMETHASONE SODIUM PHOSPHATE 10 MG/ML IJ SOLN
INTRAMUSCULAR | Status: AC
  Filled 2018-08-14: qty 1

## 2018-08-14 MED ORDER — LIDOCAINE 2% (20 MG/ML) 5 ML SYRINGE
INTRAMUSCULAR | Status: DC | PRN
  Administered 2018-08-14: 30 mg via INTRAVENOUS

## 2018-08-14 MED ORDER — VANCOMYCIN HCL 10 G IV SOLR: 20.0000 mg/kg | Freq: Once | INTRAVENOUS | Status: DC

## 2018-08-14 MED ORDER — OXYCODONE HCL 5 MG PO TABS
10.0000 mg | ORAL_TABLET | Freq: Four times a day (QID) | ORAL | Status: DC | PRN
  Administered 2018-08-14 – 2018-08-17 (×8): 10 mg via ORAL
  Filled 2018-08-14 (×9): qty 2

## 2018-08-14 MED ORDER — SUGAMMADEX SODIUM 200 MG/2ML IV SOLN
INTRAVENOUS | Status: DC | PRN
  Administered 2018-08-14: 300 mg via INTRAVENOUS

## 2018-08-14 MED ORDER — SODIUM CHLORIDE 0.9 % IV SOLN
INTRAVENOUS | Status: DC | PRN
  Administered 2018-08-14: 18:00:00 via INTRAVENOUS

## 2018-08-14 MED ORDER — ONDANSETRON 4 MG PO TBDP
4.0000 mg | ORAL_TABLET | Freq: Four times a day (QID) | ORAL | Status: DC | PRN
  Administered 2018-08-23: 4 mg via ORAL
  Filled 2018-08-14: qty 1

## 2018-08-14 MED ORDER — ROCURONIUM BROMIDE 50 MG/5ML IV SOSY
PREFILLED_SYRINGE | INTRAVENOUS | Status: AC
  Filled 2018-08-14: qty 10

## 2018-08-14 MED ORDER — PROPOFOL 10 MG/ML IV BOLUS
INTRAVENOUS | Status: AC
  Filled 2018-08-14: qty 20

## 2018-08-14 MED ORDER — ONDANSETRON HCL 4 MG/2ML IJ SOLN
4.0000 mg | Freq: Four times a day (QID) | INTRAMUSCULAR | Status: DC | PRN
  Administered 2018-08-14 – 2018-08-25 (×5): 4 mg via INTRAVENOUS
  Filled 2018-08-14 (×5): qty 2

## 2018-08-14 MED ORDER — ROCURONIUM BROMIDE 50 MG/5ML IV SOSY
PREFILLED_SYRINGE | INTRAVENOUS | Status: DC | PRN
  Administered 2018-08-14: 10 mg via INTRAVENOUS
  Administered 2018-08-14: 30 mg via INTRAVENOUS
  Administered 2018-08-14: 20 mg via INTRAVENOUS

## 2018-08-14 MED ORDER — ACETAMINOPHEN 10 MG/ML IV SOLN
INTRAVENOUS | Status: AC
  Filled 2018-08-14: qty 100

## 2018-08-14 MED ORDER — FENTANYL CITRATE (PF) 250 MCG/5ML IJ SOLN
INTRAMUSCULAR | Status: AC
  Filled 2018-08-14: qty 5

## 2018-08-14 MED ORDER — DIPHENHYDRAMINE HCL 50 MG/ML IJ SOLN
12.5000 mg | Freq: Four times a day (QID) | INTRAMUSCULAR | Status: DC | PRN
  Administered 2018-08-15 (×2): 12.5 mg via INTRAVENOUS
  Filled 2018-08-14 (×2): qty 1

## 2018-08-14 MED ORDER — LACTATED RINGERS IV SOLN
INTRAVENOUS | Status: DC
  Administered 2018-08-14 – 2018-08-18 (×6): via INTRAVENOUS

## 2018-08-14 MED ORDER — 0.9 % SODIUM CHLORIDE (POUR BTL) OPTIME
TOPICAL | Status: DC | PRN
  Administered 2018-08-14 (×2): 1000 mL

## 2018-08-14 MED ORDER — LIDOCAINE 2% (20 MG/ML) 5 ML SYRINGE
INTRAMUSCULAR | Status: AC
  Filled 2018-08-14: qty 5

## 2018-08-14 MED ORDER — HYDRALAZINE HCL 20 MG/ML IJ SOLN: 10.0000 mg | INTRAMUSCULAR | Status: DC | PRN

## 2018-08-14 MED ORDER — SUCCINYLCHOLINE CHLORIDE 20 MG/ML IJ SOLN
INTRAMUSCULAR | Status: DC | PRN
  Administered 2018-08-14: 100 mg via INTRAVENOUS

## 2018-08-14 MED ORDER — ACETAMINOPHEN 10 MG/ML IV SOLN
INTRAVENOUS | Status: DC | PRN
  Administered 2018-08-14: 1000 mg via INTRAVENOUS

## 2018-08-14 MED ORDER — LAMOTRIGINE 25 MG PO TABS
25.0000 mg | ORAL_TABLET | Freq: Two times a day (BID) | ORAL | Status: DC
  Administered 2018-08-15 – 2018-08-19 (×8): 25 mg via ORAL
  Filled 2018-08-14 (×16): qty 1

## 2018-08-14 MED ORDER — MIDAZOLAM HCL 5 MG/5ML IJ SOLN
INTRAMUSCULAR | Status: DC | PRN
  Administered 2018-08-14: 2 mg via INTRAVENOUS

## 2018-08-14 MED ORDER — PROMETHAZINE HCL 25 MG/ML IJ SOLN: 6.2500 mg | INTRAMUSCULAR | Status: DC | PRN

## 2018-08-14 MED ORDER — MIDAZOLAM HCL 2 MG/2ML IJ SOLN
INTRAMUSCULAR | Status: AC
  Filled 2018-08-14: qty 2

## 2018-08-14 MED ORDER — IBUPROFEN 200 MG PO TABS
600.0000 mg | ORAL_TABLET | Freq: Four times a day (QID) | ORAL | Status: DC | PRN
  Administered 2018-08-17: 600 mg via ORAL
  Filled 2018-08-14: qty 3

## 2018-08-14 MED ORDER — LACTATED RINGERS IV SOLN
INTRAVENOUS | Status: DC | PRN
  Administered 2018-08-14 (×2): via INTRAVENOUS

## 2018-08-14 MED ORDER — PIPERACILLIN-TAZOBACTAM 3.375 G IVPB 30 MIN
3.3750 g | Freq: Once | INTRAVENOUS | Status: AC
  Administered 2018-08-14: 3.375 g via INTRAVENOUS
  Filled 2018-08-14: qty 50

## 2018-08-14 MED ORDER — ONDANSETRON HCL 4 MG/2ML IJ SOLN
4.0000 mg | Freq: Once | INTRAMUSCULAR | Status: AC
  Administered 2018-08-14: 4 mg via INTRAVENOUS
  Filled 2018-08-14: qty 2

## 2018-08-14 MED ORDER — ACETAMINOPHEN 500 MG PO TABS
1000.0000 mg | ORAL_TABLET | Freq: Four times a day (QID) | ORAL | Status: DC
  Administered 2018-08-14 – 2018-08-18 (×12): 1000 mg via ORAL
  Filled 2018-08-14 (×14): qty 2

## 2018-08-14 MED ORDER — ONDANSETRON HCL 4 MG/2ML IJ SOLN
INTRAMUSCULAR | Status: AC
  Filled 2018-08-14: qty 2

## 2018-08-14 MED ORDER — ROCURONIUM BROMIDE 50 MG/5ML IV SOSY
PREFILLED_SYRINGE | INTRAVENOUS | Status: AC
  Filled 2018-08-14: qty 5

## 2018-08-14 MED ORDER — DEXMEDETOMIDINE HCL IN NACL 200 MCG/50ML IV SOLN
INTRAVENOUS | Status: AC
  Filled 2018-08-14: qty 50

## 2018-08-14 MED ORDER — EPHEDRINE 5 MG/ML INJ
INTRAVENOUS | Status: AC
  Filled 2018-08-14: qty 10

## 2018-08-14 MED ORDER — DEXMEDETOMIDINE HCL IN NACL 200 MCG/50ML IV SOLN
INTRAVENOUS | Status: DC | PRN
  Administered 2018-08-14: 40 ug via INTRAVENOUS

## 2018-08-14 MED ORDER — PROPOFOL 10 MG/ML IV BOLUS
INTRAVENOUS | Status: DC | PRN
  Administered 2018-08-14: 200 mg via INTRAVENOUS

## 2018-08-14 MED ORDER — SIMETHICONE 80 MG PO CHEW: 40.0000 mg | CHEWABLE_TABLET | Freq: Four times a day (QID) | ORAL | Status: DC | PRN

## 2018-08-14 MED ORDER — VANCOMYCIN HCL 10 G IV SOLR
2000.0000 mg | INTRAVENOUS | Status: AC
  Filled 2018-08-14 (×2): qty 2000

## 2018-08-14 MED ORDER — FENTANYL CITRATE (PF) 100 MCG/2ML IJ SOLN
25.0000 ug | INTRAMUSCULAR | Status: DC | PRN
  Administered 2018-08-14 (×2): 50 ug via INTRAVENOUS

## 2018-08-14 MED ORDER — PHENYLEPHRINE 40 MCG/ML (10ML) SYRINGE FOR IV PUSH (FOR BLOOD PRESSURE SUPPORT)
PREFILLED_SYRINGE | INTRAVENOUS | Status: AC
  Filled 2018-08-14: qty 10

## 2018-08-14 MED ORDER — SODIUM CHLORIDE 0.9% FLUSH: 10.0000 mL | INTRAVENOUS | Status: DC | PRN

## 2018-08-14 MED ORDER — DIPHENHYDRAMINE HCL 12.5 MG/5ML PO ELIX
12.5000 mg | ORAL_SOLUTION | Freq: Four times a day (QID) | ORAL | Status: DC | PRN
  Administered 2018-08-16 – 2018-08-18 (×2): 12.5 mg via ORAL
  Filled 2018-08-14 (×2): qty 5

## 2018-08-14 MED ORDER — BUPROPION HCL ER (XL) 150 MG PO TB24
150.0000 mg | ORAL_TABLET | Freq: Every day | ORAL | Status: DC
  Administered 2018-08-15 – 2018-08-19 (×5): 150 mg via ORAL
  Filled 2018-08-14 (×6): qty 1

## 2018-08-14 MED ORDER — FENTANYL CITRATE (PF) 100 MCG/2ML IJ SOLN
INTRAMUSCULAR | Status: DC | PRN
  Administered 2018-08-14: 50 ug via INTRAVENOUS
  Administered 2018-08-14 (×2): 100 ug via INTRAVENOUS

## 2018-08-14 MED ORDER — LURASIDONE HCL 20 MG PO TABS
20.0000 mg | ORAL_TABLET | Freq: Every day | ORAL | Status: DC
  Administered 2018-08-15 – 2018-08-19 (×5): 20 mg via ORAL
  Filled 2018-08-14 (×8): qty 1

## 2018-08-14 MED ORDER — FENTANYL CITRATE (PF) 100 MCG/2ML IJ SOLN
INTRAMUSCULAR | Status: AC
  Filled 2018-08-14: qty 2

## 2018-08-14 MED ORDER — EPHEDRINE SULFATE 50 MG/ML IJ SOLN
INTRAMUSCULAR | Status: DC | PRN
  Administered 2018-08-14: 10 mg via INTRAVENOUS

## 2018-08-14 SURGICAL SUPPLY — 44 items
BINDER ABDOMINAL 12 ML 46-62 (SOFTGOODS) ×2 IMPLANT
BLADE CLIPPER SURG (BLADE) ×2 IMPLANT
CANISTER SUCT 3000ML PPV (MISCELLANEOUS) ×3 IMPLANT
CHLORAPREP W/TINT 26ML (MISCELLANEOUS) ×1 IMPLANT
COVER SURGICAL LIGHT HANDLE (MISCELLANEOUS) ×3 IMPLANT
COVER WAND RF STERILE (DRAPES) ×3 IMPLANT
DRAPE LAPAROSCOPIC ABDOMINAL (DRAPES) ×3 IMPLANT
DRAPE WARM FLUID 44X44 (DRAPE) ×3 IMPLANT
DRSG OPSITE POSTOP 4X6 (GAUZE/BANDAGES/DRESSINGS) ×2 IMPLANT
ELECT BLADE 6.5 EXT (BLADE) ×2 IMPLANT
ELECT REM PT RETURN 9FT ADLT (ELECTROSURGICAL) ×3
ELECTRODE REM PT RTRN 9FT ADLT (ELECTROSURGICAL) ×1 IMPLANT
GAUZE SPONGE 4X4 12PLY STRL (GAUZE/BANDAGES/DRESSINGS) ×2 IMPLANT
GLOVE BIOGEL M STRL SZ7.5 (GLOVE) ×3 IMPLANT
GLOVE INDICATOR 8.0 STRL GRN (GLOVE) ×3 IMPLANT
GOWN STRL REUS W/ TWL LRG LVL3 (GOWN DISPOSABLE) ×1 IMPLANT
GOWN STRL REUS W/ TWL XL LVL3 (GOWN DISPOSABLE) ×1 IMPLANT
GOWN STRL REUS W/TWL LRG LVL3 (GOWN DISPOSABLE) ×6
GOWN STRL REUS W/TWL XL LVL3 (GOWN DISPOSABLE) ×6
KIT BASIN OR (CUSTOM PROCEDURE TRAY) ×3 IMPLANT
KIT TURNOVER KIT B (KITS) ×3 IMPLANT
LIGASURE IMPACT 36 18CM CVD LR (INSTRUMENTS) IMPLANT
NS IRRIG 1000ML POUR BTL (IV SOLUTION) ×6 IMPLANT
PACK GENERAL/GYN (CUSTOM PROCEDURE TRAY) ×3 IMPLANT
PAD ARMBOARD 7.5X6 YLW CONV (MISCELLANEOUS) ×5 IMPLANT
PENCIL SMOKE EVACUATOR (MISCELLANEOUS) ×3 IMPLANT
SPECIMEN JAR LARGE (MISCELLANEOUS) IMPLANT
SPONGE LAP 18X18 RF (DISPOSABLE) ×2 IMPLANT
STAPLER VISISTAT 35W (STAPLE) ×3 IMPLANT
SUCTION POOLE TIP (SUCTIONS) ×3 IMPLANT
SUT ETHILON 1 LR 30 (SUTURE) ×2 IMPLANT
SUT ETHILON 2 0 PSLX (SUTURE) ×4 IMPLANT
SUT ETHILON O TP 1 (SUTURE) ×2 IMPLANT
SUT PDS AB 1 TP1 96 (SUTURE) ×6 IMPLANT
SUT PDS AB 3-0 SH 27 (SUTURE) ×4 IMPLANT
SUT PDS AB 4-0 RB1 27 (SUTURE) ×2 IMPLANT
SUT SILK 2 0 SH CR/8 (SUTURE) ×3 IMPLANT
SUT SILK 2 0 TIES 10X30 (SUTURE) ×3 IMPLANT
SUT SILK 3 0 SH CR/8 (SUTURE) ×3 IMPLANT
SUT SILK 3 0 TIES 10X30 (SUTURE) ×3 IMPLANT
SUT SILK 4 0 SH CR/8 (SUTURE) ×4 IMPLANT
TOWEL OR 17X26 10 PK STRL BLUE (TOWEL DISPOSABLE) ×3 IMPLANT
TRAY FOLEY MTR SLVR 16FR STAT (SET/KITS/TRAYS/PACK) ×3 IMPLANT
YANKAUER SUCT BULB TIP NO VENT (SUCTIONS) ×2 IMPLANT

## 2018-08-14 NOTE — ED Triage Notes (Signed)
[facility] reports pt from jail. Pt has had multiple SI attempts. Today he has used his finger to open an old surgical scar. The wound is currently bandaged.   135/75 hr 71 rr 16 100% RA
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
## 2018-08-14 NOTE — Anesthesia Procedure Notes (Signed)
Procedure Name: Intubation Date/Time: 08/14/2018 7:37 PM Performed by: Eligha Bridegroom, CRNA Pre-anesthesia Checklist: Suction available, Patient identified, Emergency Drugs available, Patient being monitored and Timeout performed Patient Re-evaluated:Patient Re-evaluated prior to induction Oxygen Delivery Method: Circle system utilized Induction Type: IV induction, Rapid sequence and Cricoid Pressure applied Laryngoscope Size: Mac and 4 Grade View: Grade I Tube size: 7.5 mm Placement Confirmation: ETT inserted through vocal cords under direct vision,  positive ETCO2 and breath sounds checked- equal and bilateral Secured at: 22 cm Tube secured with: Tape Dental Injury: Teeth and Oropharynx as per pre-operative assessment
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
## 2018-08-14 NOTE — Anesthesia Preprocedure Evaluation (Signed)
Anesthesia Evaluation  Patient identified by MRN, date of birth, ID band Patient awake    Reviewed: Allergy & Precautions, NPO status , Patient's Chart, lab work & pertinent test results  Airway Mallampati: II  TM Distance: >3 FB Neck ROM: Full    Dental  (+) Teeth Intact, Dental Advisory Given   Pulmonary Current Smoker,    Pulmonary exam normal breath sounds clear to auscultation       Cardiovascular negative cardio ROS Normal cardiovascular exam Rhythm:Regular Rate:Normal     Neuro/Psych PSYCHIATRIC DISORDERS Anxiety Depression negative neurological ROS     GI/Hepatic (+)     substance abuse  cocaine use, Bowel injury: He currently has an open wound with 2-3 cm of open fascia and drainage of enteric contents   Endo/Other  negative endocrine ROS  Renal/GU negative Renal ROS     Musculoskeletal negative musculoskeletal ROS (+)   Abdominal   Peds  Hematology  (+) Blood dyscrasia, anemia ,   Anesthesia Other Findings Day of surgery medications reviewed with the patient.  Reproductive/Obstetrics                             Anesthesia Physical Anesthesia Plan  ASA: III and emergent  Anesthesia Plan: General   Post-op Pain Management:    Induction: Intravenous, Rapid sequence and Cricoid pressure planned  PONV Risk Score and Plan: 3 and Dexamethasone, Ondansetron and Treatment may vary due to age or medical condition  Airway Management Planned: Oral ETT  Additional Equipment:   Intra-op Plan:   Post-operative Plan: Possible Post-op intubation/ventilation  Informed Consent: I have reviewed the patients History and Physical, chart, labs and discussed the procedure including the risks, benefits and alternatives for the proposed anesthesia with the patient or authorized representative who has indicated his/her understanding and acceptance.     Dental advisory given  Plan  Discussed with: CRNA  Anesthesia Plan Comments:         Anesthesia Quick Evaluation

## 2018-08-14 NOTE — Progress Notes (Signed)
Per radiology (Dr. Phill Myron) L nare NG tube appropriately placed in stomach.

## 2018-08-14 NOTE — ED Notes (Signed)
IV team at bedside 

## 2018-08-14 NOTE — Transfer of Care (Signed)
Immediate Anesthesia Transfer of Care Note  Patient: David Mcconnell  Procedure(s) Performed: EXPLORATORY LAPAROTOMY, REPAIR BOWEL INJURY (N/A Abdomen)  Patient Location: PACU  Anesthesia Type:General  Level of Consciousness: awake  Airway & Oxygen Therapy: Patient Spontanous Breathing and Patient connected to face mask oxygen  Post-op Assessment: Report given to RN and Post -op Vital signs reviewed and stable  Post vital signs: Reviewed and stable  Last Vitals:  Vitals Value Taken Time  BP 129/71 08/14/2018  9:22 PM  Temp    Pulse 68 08/14/2018  9:28 PM  Resp 12 08/14/2018  9:28 PM  SpO2 100 % 08/14/2018  9:28 PM  Vitals shown include unvalidated device data.  Last Pain:  Vitals:   08/14/18 1714  TempSrc: Oral  PainSc:          Complications: No apparent anesthesia complications

## 2018-08-15 ENCOUNTER — Encounter (HOSPITAL_COMMUNITY): Payer: Self-pay | Admitting: Surgery

## 2018-08-15 DIAGNOSIS — Z7289 Other problems related to lifestyle: Secondary | ICD-10-CM

## 2018-08-15 LAB — CBC WITH DIFFERENTIAL/PLATELET
Abs Immature Granulocytes: 0.02 10*3/uL (ref 0.00–0.07)
Basophils Absolute: 0 10*3/uL (ref 0.0–0.1)
Basophils Relative: 0 %
Eosinophils Absolute: 0.2 10*3/uL (ref 0.0–0.5)
Eosinophils Relative: 2 %
HEMATOCRIT: 30.8 % — AB (ref 39.0–52.0)
Hemoglobin: 9.1 g/dL — ABNORMAL LOW (ref 13.0–17.0)
Immature Granulocytes: 0 %
Lymphocytes Relative: 7 %
Lymphs Abs: 0.7 10*3/uL (ref 0.7–4.0)
MCH: 23.2 pg — ABNORMAL LOW (ref 26.0–34.0)
MCHC: 29.5 g/dL — ABNORMAL LOW (ref 30.0–36.0)
MCV: 78.4 fL — ABNORMAL LOW (ref 80.0–100.0)
Monocytes Absolute: 0.4 10*3/uL (ref 0.1–1.0)
Monocytes Relative: 4 %
NEUTROS ABS: 8.6 10*3/uL — AB (ref 1.7–7.7)
Neutrophils Relative %: 87 %
Platelets: 260 10*3/uL (ref 150–400)
RBC: 3.93 MIL/uL — ABNORMAL LOW (ref 4.22–5.81)
RDW: 15.1 % (ref 11.5–15.5)
WBC: 10 10*3/uL (ref 4.0–10.5)
nRBC: 0 % (ref 0.0–0.2)

## 2018-08-15 LAB — COMPREHENSIVE METABOLIC PANEL
ALBUMIN: 3 g/dL — AB (ref 3.5–5.0)
ALT: 18 U/L (ref 0–44)
AST: 21 U/L (ref 15–41)
Alkaline Phosphatase: 51 U/L (ref 38–126)
Anion gap: 10 (ref 5–15)
BUN: 6 mg/dL (ref 6–20)
CO2: 26 mmol/L (ref 22–32)
Calcium: 8.9 mg/dL (ref 8.9–10.3)
Chloride: 104 mmol/L (ref 98–111)
Creatinine, Ser: 1.04 mg/dL (ref 0.61–1.24)
GFR calc Af Amer: >60 mL/min (ref >60)
GFR calc non Af Amer: >60 mL/min (ref >60)
Glucose, Bld: 119 mg/dL — ABNORMAL HIGH (ref 70–99)
Potassium: 4.2 mmol/L (ref 3.5–5.1)
Sodium: 140 mmol/L (ref 135–145)
Total Bilirubin: 0.3 mg/dL (ref 0.3–1.2)
Total Protein: 6 g/dL — ABNORMAL LOW (ref 6.5–8.1)

## 2018-08-15 LAB — PROTIME-INR
INR: 1.1 (ref 0.8–1.2)
Prothrombin Time: 13.9 seconds (ref 11.4–15.2)

## 2018-08-15 LAB — APTT: aPTT: 32 seconds (ref 24–36)

## 2018-08-15 MED ORDER — MUPIROCIN 2 % EX OINT
1.0000 "application " | TOPICAL_OINTMENT | Freq: Two times a day (BID) | CUTANEOUS | Status: AC
  Administered 2018-08-15 – 2018-08-19 (×9): 1 via NASAL
  Filled 2018-08-15 (×4): qty 22

## 2018-08-15 MED ORDER — CHLORHEXIDINE GLUCONATE CLOTH 2 % EX PADS
6.0000 | MEDICATED_PAD | Freq: Every day | CUTANEOUS | Status: AC
  Administered 2018-08-16 – 2018-08-20 (×5): 6 via TOPICAL

## 2018-08-15 MED ORDER — METHOCARBAMOL 500 MG PO TABS
500.0000 mg | ORAL_TABLET | Freq: Three times a day (TID) | ORAL | Status: DC | PRN
  Administered 2018-08-15 – 2018-08-18 (×4): 500 mg via ORAL
  Filled 2018-08-15 (×5): qty 1
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
## 2018-08-15 NOTE — Anesthesia Postprocedure Evaluation (Signed)
Anesthesia Post Note  Patient: David Mcconnell  Procedure(s) Performed: EXPLORATORY LAPAROTOMY, REPAIR BOWEL INJURY (N/A Abdomen)     Patient location during evaluation: PACU Anesthesia Type: General Level of consciousness: awake and alert Pain management: pain level controlled Vital Signs Assessment: post-procedure vital signs reviewed and stable Respiratory status: spontaneous breathing, nonlabored ventilation and respiratory function stable Cardiovascular status: blood pressure returned to baseline and stable Postop Assessment: no apparent nausea or vomiting Anesthetic complications: no    Last Vitals:  Vitals:   08/14/18 2200 08/14/18 2217  BP: 122/69 124/67  Pulse: 66   Resp: 12   Temp: 36.9 C 37.1 C  SpO2: 96%     Last Pain:  Vitals:   08/15/18 0241  TempSrc:   PainSc: 4                  Cecile Hearing

## 2018-08-15 NOTE — Progress Notes (Signed)
Patient ID: David Mcconnell, male   DOB: 01-30-1981, 38 y.o.   MRN: [id number]    1 Day Post-Op  Subjective: C/o some pain this morning.  No flatus.  No other issues at this time.  Objective: Vital signs in last 24 hours: Temp:  [98.1 F (36.7 C)-98.7 F (37.1 C)] 98.4 F (36.9 C) (02/27 0400) Pulse Rate:  [54-93] 60 (02/27 0400) Resp:  [12-25] 17 (02/27 0400) BP: (113-143)/(55-102) 113/57 (02/27 0400) SpO2:  [96 %-100 %] 97 % (02/27 0400) Weight:  [90.7 kg] 90.7 kg (02/26 1600)    Intake/Output from previous day: 02/26 0701 - 02/27 0700 In: 1500 [I.V.:1500] Out: 625 [Urine:250; Emesis/NG output:350; Blood:25] Intake/Output this shift: No intake/output data recorded.  PE: Gen: NAD Heart: regular Lungs: CTAB Abd: soft, appropriately tender, +BS, NGT in place with copious watered-down appearing output (not eating ice or drinking water), midline incision is c/d/i with some old bloody drainage on the inferior aspect of the honeycomb dressing. Ext: in handcuffs   Lab Results:  Recent Labs    08/14/18 1641 08/15/18 0514  WBC 6.5 10.0  HGB 10.0* 9.1*  HCT 33.8* 30.8*  PLT 255 260   BMET Recent Labs    08/14/18 1641 08/15/18 0514  NA 141 140  K 3.4* 4.2  CL 105 104  CO2 26 26  GLUCOSE 85 119*  BUN 5* 6  CREATININE 0.94 1.04  CALCIUM 9.3 8.9   PT/INR Recent Labs    08/15/18 0514  LABPROT 13.9  INR 1.1   CMP     Component Value Date/Time   NA 140 08/15/2018 0514   K 4.2 08/15/2018 0514   CL 104 08/15/2018 0514   CO2 26 08/15/2018 0514   GLUCOSE 119 (H) 08/15/2018 0514   BUN 6 08/15/2018 0514   CREATININE 1.04 08/15/2018 0514   CALCIUM 8.9 08/15/2018 0514   PROT 6.0 (L) 08/15/2018 0514   ALBUMIN 3.0 (L) 08/15/2018 0514   AST 21 08/15/2018 0514   ALT 18 08/15/2018 0514   ALKPHOS 51 08/15/2018 0514   BILITOT 0.3 08/15/2018 0514   GFRNONAA >60 08/15/2018 0514   GFRAA >60 08/15/2018 0514   Lipase  No results found for:  LIPASE     Studies/Results: Dg Abd Portable 1v  Result Date: 08/14/2018 CLINICAL DATA:  Initial evaluation for incorrect surgical count. EXAM: PORTABLE ABDOMEN - 1 VIEW COMPARISON:  Prior radiographs from 08/11/2018 as well as prior CT from 06/20/2018. FINDINGS: Enteric tube and/or drain seen overlying the upper abdomen. Metallic clip overlying the right lower quadrant stable from previous. Underlying anastomotic suture line present within the right abdomen, also seen on prior CT. Skin staples overlie the mid abdomen. No other radiopaque foreign body identified. Bowel gas pattern is nonobstructive without abnormality. IMPRESSION: 1. No radiopaque foreign body identified. 2. Surgical clips with underlying anastomotic suture line within the right lower quadrant, also seen on prior CT from 06/20/2018. 3. Distal aspect of enteric tube and/or drain overlying the left upper quadrant. Findings communicated by telephone with Dr. Marin Olp at 9:20 pm on 08/14/2018. Electronically Signed   By: Rise Mu M.D.   On: 08/14/2018 21:31    Anti-infectives: Anti-infectives (From admission, onward)   Start     Dose/Rate Route Frequency Ordered Stop   08/14/18 1730  vancomycin (VANCOCIN) 2,000 mg in sodium chloride 0.9 % 500 mL IVPB     2,000 mg 250 mL/hr over 120 Minutes Intravenous STAT 08/14/18 1658 08/15/18 1730   08/14/18 1645  piperacillin-tazobactam (ZOSYN) IVPB 3.375 g     3.375 g 100 mL/hr over 30 Minutes Intravenous  Once 08/14/18 1640 08/14/18 1931   08/14/18 1645  vancomycin (VANCOCIN) 1,814 mg in sodium chloride 0.9 % 500 mL IVPB  Status:  Discontinued     20 mg/kg  90.7 kg 250 mL/hr over 120 Minutes Intravenous  Once 08/14/18 1640 08/14/18 1658       Assessment/Plan Self-inflicted bowel evisceration POD 1, s/p ex lap with primary repair of bowel injury, Dr. Cliffton Asters 2/26 - cont NGT for today.  Suspect won't need it much longer than today.  Mobilize as able.  Sitter will try  and hold IS for him today so he can do this. -cont zosyn, ? Duration, ? 5 days -keep wound covered with abdominal binder and patient in handcuffs to eliminate chance of further self-harm at this time -pain control, multimodal.  May given oral meds with NGT clamped Psychiatric DO (borderline personality disorder) - psych consult pending.  May have oral psych meds with NGT clamped. FEN - NGT/IVFs VTE - Lovenox ID - Zosyn 2/26 -->   LOS: 1 day    Letha Cape , [facility] 08/15/2018, 8:34 AM Pager: [phone]

## 2018-08-16 NOTE — Discharge Instructions (Signed)
[facility]      [facility] [phone]  OPEN ABDOMINAL SURGERY: POST OP INSTRUCTIONS  Always review your discharge instruction sheet given to you by the facility where your surgery was performed.  IF YOU HAVE DISABILITY OR FAMILY LEAVE FORMS, YOU MUST BRING THEM TO THE OFFICE FOR PROCESSING.  PLEASE DO NOT GIVE THEM TO YOUR DOCTOR.  1. A prescription for pain medication may be given to you upon discharge.  Take your pain medication as prescribed, if needed.  If narcotic pain medicine is not needed, then you may take acetaminophen (Tylenol) or ibuprofen (Advil) as needed. 2. Take your usually prescribed medications unless otherwise directed. 3. If you need a refill on your pain medication, please contact your pharmacy. They will contact our office to request authorization.  Prescriptions will not be filled after 5pm or on week-ends. 4. You should follow a light diet the first few days after arrival home, such as soup and crackers, pudding, etc.unless your doctor has advised otherwise. A high-fiber, low fat diet can be resumed as tolerated.   Be sure to include lots of fluids daily. Most patients will experience some swelling and bruising on the chest and neck area.  Ice packs will help.  Swelling and bruising can take several days to resolve 5. Most patients will experience some swelling and bruising in the area of the incision. Ice pack will help. Swelling and bruising can take several days to resolve..  6. It is common to experience some constipation if taking pain medication after surgery.  Increasing fluid intake and taking a stool softener will usually help or prevent this problem from occurring.  A mild laxative (Milk of Magnesia or Miralax) should be taken according to package directions if there are no bowel movements after 48 hours. 7.  You may have steri-strips (small skin tapes) in place directly over the incision.  These strips should be left on the skin for 7-10 days.  If your  surgeon used skin glue on the incision, you may shower in 24 hours.  The glue will flake off over the next 2-3 weeks.  Any sutures or staples will be removed at the office during your follow-up visit. You may find that a light gauze bandage over your incision may keep your staples from being rubbed or pulled. You may shower and replace the bandage daily. 8. ACTIVITIES:  You may resume regular (light) daily activities beginning the next day--such as daily self-care, walking, climbing stairs--gradually increasing activities as tolerated.  You may have sexual intercourse when it is comfortable.  Refrain from any heavy lifting or straining until approved by your doctor. a. You may drive when you no longer are taking prescription pain medication, you can comfortably wear a seatbelt, and you can safely maneuver your car and apply brakes b. Return to Work: ___________________________________ 9. You should see your doctor in the office for a follow-up appointment approximately two weeks after your surgery.  Make sure that you call for this appointment within a day or two after you arrive home to insure a convenient appointment time. OTHER INSTRUCTIONS:  _____________________________________________________________ _____________________________________________________________  WHEN TO CALL YOUR DOCTOR: 1. Fever over 101.0 2. Inability to urinate 3. Nausea and/or vomiting 4. Extreme swelling or bruising 5. Continued bleeding from incision. 6. Increased pain, redness, or drainage from the incision. 7. Difficulty swallowing or breathing 8. Muscle cramping or spasms. 9. Numbness or tingling in hands or feet or around lips.  The clinic staff is available to   answer your questions during regular business hours.  Please don't hesitate to call and ask to speak to one of the nurses if you have concerns.  For further questions, please visit [email or web address]   

## 2018-08-16 NOTE — Progress Notes (Addendum)
Patient ID: David Mcconnell, male   DOB: [birth year], 38 y.o.   MRN: [id number]    2 Days Post-Op  Subjective: Passing flatus.  Some abdominal pain, but doesn't appear in distress.  Did walk some yesterday.  Had to be I&O cath overnight, but has voided over 400cc since then.  Objective: Vital signs in last 24 hours: Temp:  [98.4 F (36.9 C)-98.7 F (37.1 C)] 98.7 F (37.1 C) (02/28 0357) Pulse Rate:  [55-99] 99 (02/28 0357) Resp:  [16-20] 20 (02/28 0357) BP: (107-119)/(65-69) 108/67 (02/28 0357) SpO2:  [98 %-100 %] 99 % (02/28 0357) Last BM Date: (PTA per patient)  Intake/Output from previous day: 02/27 0701 - 02/28 0700 In: 1560 [I.V.:1200; NG/GT:360] Out: 3025 [Urine:675; Emesis/NG output:2350] Intake/Output this shift: No intake/output data recorded.  PE: Heart: regular Lungs: CTAB Abd: soft, appropriately tender, +BS, ND, midline incision is c/d/i with staples, honeycomb, and abdominal binder in place. Ext: shackled in bed  Lab Results:  Recent Labs    08/14/18 1641 08/15/18 0514  WBC 6.5 10.0  HGB 10.0* 9.1*  HCT 33.8* 30.8*  PLT 255 260   BMET Recent Labs    08/14/18 1641 08/15/18 0514  NA 141 140  K 3.4* 4.2  CL 105 104  CO2 26 26  GLUCOSE 85 119*  BUN 5* 6  CREATININE 0.94 1.04  CALCIUM 9.3 8.9   PT/INR Recent Labs    08/15/18 0514  LABPROT 13.9  INR 1.1   CMP     Component Value Date/Time   NA 140 08/15/2018 0514   K 4.2 08/15/2018 0514   CL 104 08/15/2018 0514   CO2 26 08/15/2018 0514   GLUCOSE 119 (H) 08/15/2018 0514   BUN 6 08/15/2018 0514   CREATININE 1.04 08/15/2018 0514   CALCIUM 8.9 08/15/2018 0514   PROT 6.0 (L) 08/15/2018 0514   ALBUMIN 3.0 (L) 08/15/2018 0514   AST 21 08/15/2018 0514   ALT 18 08/15/2018 0514   ALKPHOS 51 08/15/2018 0514   BILITOT 0.3 08/15/2018 0514   GFRNONAA >60 08/15/2018 0514   GFRAA >60 08/15/2018 0514   Lipase  No results found for: LIPASE     Studies/Results: Dg Abd Portable 1v  Result  Date: 08/14/2018 CLINICAL DATA:  Initial evaluation for incorrect surgical count. EXAM: PORTABLE ABDOMEN - 1 VIEW COMPARISON:  Prior radiographs from 08/11/2018 as well as prior CT from 06/20/2018. FINDINGS: Enteric tube and/or drain seen overlying the upper abdomen. Metallic clip overlying the right lower quadrant stable from previous. Underlying anastomotic suture line present within the right abdomen, also seen on prior CT. Skin staples overlie the mid abdomen. No other radiopaque foreign body identified. Bowel gas pattern is nonobstructive without abnormality. IMPRESSION: 1. No radiopaque foreign body identified. 2. Surgical clips with underlying anastomotic suture line within the right lower quadrant, also seen on prior CT from 06/20/2018. 3. Distal aspect of enteric tube and/or drain overlying the left upper quadrant. Findings communicated by telephone with Dr. Marin Olp at 9:20 pm on 08/14/2018. Electronically Signed   By: Rise Mu M.D.   On: 08/14/2018 21:31    Anti-infectives: Anti-infectives (From admission, onward)   Start     Dose/Rate Route Frequency Ordered Stop   08/14/18 1730  vancomycin (VANCOCIN) 2,000 mg in sodium chloride 0.9 % 500 mL IVPB     2,000 mg 250 mL/hr over 120 Minutes Intravenous STAT 08/14/18 1658 08/15/18 1730   08/14/18 1645  piperacillin-tazobactam (ZOSYN) IVPB 3.375 g  3.375 g 100 mL/hr over 30 Minutes Intravenous  Once 08/14/18 1640 08/14/18 1931   08/14/18 1645  vancomycin (VANCOCIN) 1,814 mg in sodium chloride 0.9 % 500 mL IVPB  Status:  Discontinued     20 mg/kg  90.7 kg 250 mL/hr over 120 Minutes Intravenous  Once 08/14/18 1640 08/14/18 1658       Assessment/Plan Self-inflicted bowel evisceration POD 2, s/p ex lap with primary repair of bowel injury, Dr. Cliffton Asters 2/26 - -NGT output seems quite high from yesterday at 2300; however, he is nondistended with good BS and passing flatus.  Will DC NGT today and try clear liquids. -cont  zosyn, ? Duration, ? 5 days -keep wound covered with abdominal binder and patient in handcuffs to eliminate chance of further self-harm at this time -pain control, multimodal. Psychiatric DO (borderline personality disorder) - psych has cleared patient.  cont psych meds. FEN - NGT/IVFs VTE - Lovenox ID - Zosyn 2/26 -->2/27   LOS: 2 days    Letha Cape , [facility] 08/16/2018, 8:36 AM Pager: [phone]

## 2018-08-17 MED ORDER — LORAZEPAM 2 MG/ML IJ SOLN
1.0000 mg | Freq: Four times a day (QID) | INTRAMUSCULAR | Status: DC | PRN
  Administered 2018-08-17 – 2018-08-18 (×3): 1 mg via INTRAVENOUS
  Filled 2018-08-17 (×4): qty 1

## 2018-08-17 MED ORDER — HYDROMORPHONE HCL 1 MG/ML IJ SOLN: 0.5000 mg | INTRAMUSCULAR | Status: DC | PRN

## 2018-08-17 MED ORDER — OXYCODONE HCL 5 MG PO TABS
5.0000 mg | ORAL_TABLET | ORAL | Status: DC | PRN
  Administered 2018-08-17 – 2018-08-18 (×6): 10 mg via ORAL
  Administered 2018-08-19: 5 mg via ORAL
  Administered 2018-08-21: 10 mg via ORAL
  Filled 2018-08-17 (×4): qty 2
  Filled 2018-08-17: qty 1
  Filled 2018-08-17 (×4): qty 2

## 2018-08-17 NOTE — Progress Notes (Signed)
3 Days Post-Op    CC: Self-mutilation  Subjective: Patient is tolerating clears well.  Positive flatus.  No BM so far.  Asking if we can take out foreign metallic bodies in his duodenum and jejunum.  Complain of pain and he also asking for his Dilaudid.  Objective: Vital signs in last 24 hours: Temp:  [98.5 F (36.9 C)-98.7 F (37.1 C)] 98.5 F (36.9 C) (02/29 0405) Pulse Rate:  [51-71] 51 (02/29 0405) Resp:  [18-20] 20 (02/29 0405) BP: (121-141)/(66-90) 121/67 (02/29 0405) SpO2:  [100 %] 100 % (02/29 0405) Last BM Date: 08/14/18(PTA per patient) Pain control Tylenol 1 g every 8, Wellbutrin 50 mg daily, Dilaudid 0.53 yesterday, oxycodone 10 mg x 2 yesterday and 2 times this a.m. so far 480 p.o. 900 IV 3 500 urine 100 NG Afebrile vital signs are stable No labs today No films.  Intake/Output from previous day: 02/28 0701 - 02/29 0700 In: 1410 [P.O.:480; I.V.:899; NG/GT:30] Out: 3600 [Urine:3500; Emesis/NG output:100] Intake/Output this shift: No intake/output data recorded.  General appearance: alert, cooperative and no distress Resp: clear to auscultation bilaterally GI: Soft positive bowel sounds.  Dressings intact.  Tolerating clear liquids with flatus no BM.  Lab Results:  Recent Labs    08/14/18 1641 08/15/18 0514  WBC 6.5 10.0  HGB 10.0* 9.1*  HCT 33.8* 30.8*  PLT 255 260    BMET Recent Labs    08/14/18 1641 08/15/18 0514  NA 141 140  K 3.4* 4.2  CL 105 104  CO2 26 26  GLUCOSE 85 119*  BUN 5* 6  CREATININE 0.94 1.04  CALCIUM 9.3 8.9   PT/INR Recent Labs    08/15/18 0514  LABPROT 13.9  INR 1.1    Recent Labs  Lab 08/11/18 0748 08/12/18 0317 08/15/18 0514  AST 16 15 21   ALT 16 13 18   ALKPHOS 48 44 51  BILITOT 0.5 0.8 0.3  PROT 5.6* 5.2* 6.0*  ALBUMIN 3.0* 2.8* 3.0*     Lipase  No results found for: LIPASE   Medications: . acetaminophen  1,000 mg Oral Q6H  . buPROPion  150 mg Oral Daily  . Chlorhexidine Gluconate Cloth  6  each Topical Q0600  . enoxaparin (LOVENOX) injection  40 mg Subcutaneous Q24H  . lamoTRIgine  25 mg Oral BID  . lurasidone  20 mg Oral Q breakfast  . mupirocin ointment  1 application Nasal BID   . lactated ringers 100 mL/hr at 08/17/18 0057   Anti-infectives (From admission, onward)   Start     Dose/Rate Route Frequency Ordered Stop   08/14/18 1730  vancomycin (VANCOCIN) 2,000 mg in sodium chloride 0.9 % 500 mL IVPB     2,000 mg 250 mL/hr over 120 Minutes Intravenous STAT 08/14/18 1658 08/15/18 1730   08/14/18 1645  piperacillin-tazobactam (ZOSYN) IVPB 3.375 g     3.375 g 100 mL/hr over 30 Minutes Intravenous  Once 08/14/18 1640 08/14/18 1931   08/14/18 1645  vancomycin (VANCOCIN) 1,814 mg in sodium chloride 0.9 % 500 mL IVPB  Status:  Discontinued     20 mg/kg  90.7 kg 250 mL/hr over 120 Minutes Intravenous  Once 08/14/18 1640 08/14/18 1658      Assessment/Plan Self-inflicted bowel evisceration POD 3, s/p ex lap with primary repair of bowel injury, Dr. Cliffton Asters 2/26 - NG tube discontinued 2/28, started on clears>> fulls + Flatus, no BM Psychiatric DO (borderline personality disorder) -   -  psych has cleared patient. cont psych meds.  FEN - IV fluids/clear liquids >> full liquids VTE - Lovenox ID - Zosyn 2/26 -->2/27  1: Advance to full liquids.  Wean off Dilaudid.  He is handcuffed to the bed.  Asked the guards of his possible get him up and walking some in the halls.  Decrease IV fluids.        LOS: 3 days    David Mcconnell 08/17/2018 [phone]

## 2018-08-18 ENCOUNTER — Inpatient Hospital Stay (HOSPITAL_COMMUNITY)

## 2018-08-18 MED ORDER — LORAZEPAM 2 MG/ML IJ SOLN
1.0000 mg | Freq: Four times a day (QID) | INTRAMUSCULAR | Status: DC | PRN
  Administered 2018-08-18 – 2018-08-19 (×4): 1 mg via INTRAMUSCULAR
  Filled 2018-08-18 (×4): qty 1

## 2018-08-18 NOTE — Progress Notes (Signed)
Notified that patient tried to swallow call string next to toilet when being escorted to the bathroom and then later bit through IV and tried to swallow tubing while sitter and guards in the room. I recommended removing IV and not placing new IV tonight. I also recommended removing the TV remote and any other bed peripherals to avoid injury with corded devices/tubing

## 2018-08-18 NOTE — Progress Notes (Signed)
Intentional ingestion of pulse oximeter. He had done this numerous times in the past few days. He is not in distress.  No sharp edge, not at risk for bowel injury.  Plan for EGD tomorrow AM for retrieval NPO  K. Scherry Ran , MD [phone]

## 2018-08-18 NOTE — Progress Notes (Signed)
Officers uncuffed pt to ambulate to BR to have BM, Pt attempted to swallow call bell cord attached to the wall. Pt is not allowed to get out of bed at this time.

## 2018-08-18 NOTE — Progress Notes (Signed)
Officer came out to alert nurse, Pt swallowed something hard. Upon assessment, blood noted in the bed and clear connector on IV tubing was missing. MD notified and gave order to remove midline, and turn off television.

## 2018-08-18 NOTE — Progress Notes (Signed)
Pt very disruptive, shouting and banging on the bed rail, removed the abdominal binder and insisted and refused reapplying it back. Pt insisted on MD been called and wanted an order for abdominal xray. Pt became increasingly disruptive, refusing care. Security was called to disesscalate the situation. MD was notified, and Ativan 1 mg  Q6hrs PRN Anxiety . Pt continued been disruptive even after the ativan administration. Abdominal binder still off, and every effort to make pt keep it on proved abortive. Pt insist and maintained that there is foreign metallic bodies in his duodenum and jejunum. Pt insisted that he did not want any pain medication but just to get MD to put in an order for abdominal xray.

## 2018-08-18 NOTE — Progress Notes (Signed)
Spoke with Dr. Lavon Paganini about Xr findings. She will plan for upper endoscopy tomorrow for pulse ox retrieval -will continue patient NPO today

## 2018-08-18 NOTE — Progress Notes (Signed)
4 Days Post-Op    CC:  Subjective: Very upset this AM wanted a film of abdomen and binder off.  Security called, better with Ativan. Complaining of new pain and still wants film.  On exam he is fine, no pain with removal of honeycomb dressing and washing wound with soap and water.    Objective: Vital signs in last 24 hours: Temp:  [98.2 F (36.8 C)-98.7 F (37.1 C)] 98.2 F (36.8 C) (03/01 0602) Pulse Rate:  [49-72] 49 (03/01 0602) Resp:  [16-18] 18 (03/01 0602) BP: (113-133)/(66-79) 121/70 (03/01 0602) SpO2:  [98 %-100 %] 100 % (03/01 0602) FiO2 (%):  [18 %] 18 % (03/01 0003) Last BM Date: 08/14/18(PTA per patient) 840 PO Urine 2900 No BM recorded Afebrile, VSS NO labs Intake/Output from previous day: 02/29 0701 - 03/01 0700 In: 840 [P.O.:840] Out: 2900 [Urine:2900] Intake/Output this shift: No intake/output data recorded.  General appearance: alert and he is still wanting a film, complaining of new pain.  anxious over previous foreign objects swallowed and still in place.   Resp: clear to auscultation bilaterally GI: soft + BS, + flatus, NO BM wound ok.    Lab Results:  No results for input(s): WBC, HGB, HCT, PLT in the last 72 hours.  BMET No results for input(s): NA, K, CL, CO2, GLUCOSE, BUN, CREATININE, CALCIUM in the last 72 hours. PT/INR No results for input(s): LABPROT, INR in the last 72 hours.  Recent Labs  Lab 08/12/18 0317 08/15/18 0514  AST 15 21  ALT 13 18  ALKPHOS 44 51  BILITOT 0.8 0.3  PROT 5.2* 6.0*  ALBUMIN 2.8* 3.0*     Lipase  No results found for: LIPASE   Medications: . acetaminophen  1,000 mg Oral Q6H  . buPROPion  150 mg Oral Daily  . Chlorhexidine Gluconate Cloth  6 each Topical Q0600  . enoxaparin (LOVENOX) injection  40 mg Subcutaneous Q24H  . lamoTRIgine  25 mg Oral BID  . lurasidone  20 mg Oral Q breakfast  . mupirocin ointment  1 application Nasal BID    Assessment/Plan Self-inflicted bowel evisceration POD 4, s/p  ex lap with primary repair of bowel injury, Dr. Cliffton Asters 2/26- NG tube discontinued 2/28, started on clears>> fulls + Flatus, no BM  - full liquids 2/29 Psychiatric DO(borderline personality disorder) -   -  psychhas cleared patient.cont psych meds.   FEN -IV fluids/clear liquids >> full liquids VTE -Lovenox ID- Zosyn 2/26 -->2/27 Pain control yesterday.  Tylenol, dilaudid x 2, Ativan x 2, Robaixin x 2, oxycodone 10 x 2 and 2 doses since MN   Plan:  He had to be more restrained this AM, and ativan for disruptive behavior.  Will check a plain film, leave on full liquids for now.I will have nursing staff redress wound.       LOS: 4 days    Destinae Neubecker 08/18/2018 [phone]

## 2018-08-19 ENCOUNTER — Encounter (HOSPITAL_COMMUNITY): Admission: EM | Disposition: A | Discharge status: Home / Self Care | Payer: Self-pay

## 2018-08-19 ENCOUNTER — Inpatient Hospital Stay (HOSPITAL_COMMUNITY): Admitting: Anesthesiology

## 2018-08-19 ENCOUNTER — Inpatient Hospital Stay (HOSPITAL_COMMUNITY)

## 2018-08-19 ENCOUNTER — Encounter (HOSPITAL_COMMUNITY): Payer: Self-pay | Admitting: Certified Registered Nurse Anesthetist

## 2018-08-19 DIAGNOSIS — T18198A Other foreign object in esophagus causing other injury, initial encounter: Secondary | ICD-10-CM

## 2018-08-19 HISTORY — PX: RIGID ESOPHAGOSCOPY: SHX5226

## 2018-08-19 HISTORY — PX: ESOPHAGOGASTRODUODENOSCOPY (EGD) WITH PROPOFOL: SHX5813

## 2018-08-19 SURGERY — ESOPHAGOGASTRODUODENOSCOPY (EGD) WITH PROPOFOL
Anesthesia: General

## 2018-08-19 SURGERY — ESOPHAGOGASTRODUODENOSCOPY (EGD) WITH PROPOFOL
Anesthesia: Monitor Anesthesia Care

## 2018-08-19 SURGERY — ESOPHAGOSCOPY, RIGID
Anesthesia: General

## 2018-08-19 MED ORDER — LACTATED RINGERS IV SOLN: INTRAVENOUS | Status: DC

## 2018-08-19 MED ORDER — LACTATED RINGERS IV SOLN
INTRAVENOUS | Status: DC
  Administered 2018-08-19: 14:00:00 via INTRAVENOUS
  Administered 2018-08-19: 1000 mL via INTRAVENOUS
  Administered 2018-08-19: 13:00:00 via INTRAVENOUS

## 2018-08-19 MED ORDER — LIDOCAINE 2% (20 MG/ML) 5 ML SYRINGE
INTRAMUSCULAR | Status: DC | PRN
  Administered 2018-08-19: 60 mg via INTRAVENOUS

## 2018-08-19 MED ORDER — SUGAMMADEX SODIUM 200 MG/2ML IV SOLN
INTRAVENOUS | Status: DC | PRN
  Administered 2018-08-19: 400 mg via INTRAVENOUS

## 2018-08-19 MED ORDER — ACETAMINOPHEN 160 MG/5ML PO SOLN: 325.0000 mg | Freq: Once | ORAL | Status: DC

## 2018-08-19 MED ORDER — SODIUM CHLORIDE 0.9 % IV SOLN
INTRAVENOUS | Status: DC
  Administered 2018-08-19: 21:00:00 via INTRAVENOUS

## 2018-08-19 MED ORDER — LIDOCAINE HCL (CARDIAC) PF 100 MG/5ML IV SOSY
PREFILLED_SYRINGE | INTRAVENOUS | Status: DC | PRN
  Administered 2018-08-19: 100 mg via INTRATRACHEAL

## 2018-08-19 MED ORDER — PHENOL 1.4 % MT LIQD
1.0000 | OROMUCOSAL | Status: DC | PRN
  Administered 2018-08-19 – 2018-08-20 (×4): 1 via OROMUCOSAL
  Filled 2018-08-19: qty 177

## 2018-08-19 MED ORDER — PROMETHAZINE HCL 25 MG/ML IJ SOLN
6.2500 mg | INTRAMUSCULAR | Status: DC | PRN
  Administered 2018-08-19: 6.25 mg via INTRAVENOUS

## 2018-08-19 MED ORDER — PROMETHAZINE HCL 25 MG/ML IJ SOLN: 6.2500 mg | INTRAMUSCULAR | Status: DC | PRN

## 2018-08-19 MED ORDER — ONDANSETRON HCL 4 MG/2ML IJ SOLN
INTRAMUSCULAR | Status: DC | PRN
  Administered 2018-08-19: 4 mg via INTRAVENOUS

## 2018-08-19 MED ORDER — ROCURONIUM BROMIDE 10 MG/ML (PF) SYRINGE
PREFILLED_SYRINGE | INTRAVENOUS | Status: DC | PRN
  Administered 2018-08-19: 50 mg via INTRAVENOUS

## 2018-08-19 MED ORDER — MEPERIDINE HCL 50 MG/ML IJ SOLN: 6.2500 mg | INTRAMUSCULAR | Status: DC | PRN

## 2018-08-19 MED ORDER — MIDAZOLAM HCL 5 MG/5ML IJ SOLN
INTRAMUSCULAR | Status: DC | PRN
  Administered 2018-08-19: 2 mg via INTRAVENOUS

## 2018-08-19 MED ORDER — SUCCINYLCHOLINE 20MG/ML (10ML) SYRINGE FOR MEDFUSION PUMP - OPTIME
INTRAMUSCULAR | Status: DC | PRN
  Administered 2018-08-19: 160 mg via INTRAVENOUS

## 2018-08-19 MED ORDER — ACETAMINOPHEN 10 MG/ML IV SOLN: 1000.0000 mg | Freq: Once | INTRAVENOUS | Status: DC | PRN

## 2018-08-19 MED ORDER — FENTANYL CITRATE (PF) 100 MCG/2ML IJ SOLN: 25.0000 ug | INTRAMUSCULAR | Status: DC | PRN

## 2018-08-19 MED ORDER — FENTANYL CITRATE (PF) 100 MCG/2ML IJ SOLN
INTRAMUSCULAR | Status: DC | PRN
  Administered 2018-08-19: 100 ug via INTRAVENOUS

## 2018-08-19 MED ORDER — FENTANYL CITRATE (PF) 250 MCG/5ML IJ SOLN
INTRAMUSCULAR | Status: DC | PRN
  Administered 2018-08-19 (×2): 100 ug via INTRAVENOUS

## 2018-08-19 MED ORDER — PROPOFOL 10 MG/ML IV BOLUS
INTRAVENOUS | Status: DC | PRN
  Administered 2018-08-19: 200 mg via INTRAVENOUS

## 2018-08-19 MED ORDER — SODIUM CHLORIDE 0.9 % IV SOLN: INTRAVENOUS | Status: DC

## 2018-08-19 MED ORDER — ACETAMINOPHEN 325 MG PO TABS: 325.0000 mg | ORAL_TABLET | Freq: Once | ORAL | Status: DC

## 2018-08-19 MED ORDER — HALOPERIDOL LACTATE 5 MG/ML IJ SOLN
5.0000 mg | Freq: Four times a day (QID) | INTRAMUSCULAR | Status: DC | PRN
  Filled 2018-08-19: qty 1

## 2018-08-19 MED ORDER — MIDAZOLAM HCL 2 MG/2ML IJ SOLN
INTRAMUSCULAR | Status: DC | PRN
  Administered 2018-08-19: 2 mg via INTRAVENOUS

## 2018-08-19 MED ORDER — FENTANYL CITRATE (PF) 250 MCG/5ML IJ SOLN
INTRAMUSCULAR | Status: AC
  Filled 2018-08-19: qty 5

## 2018-08-19 MED ORDER — PROPOFOL 10 MG/ML IV BOLUS
INTRAVENOUS | Status: AC
  Filled 2018-08-19: qty 20

## 2018-08-19 MED ORDER — MIDAZOLAM HCL 2 MG/2ML IJ SOLN
INTRAMUSCULAR | Status: AC
  Filled 2018-08-19: qty 2

## 2018-08-19 MED ORDER — ONDANSETRON HCL 4 MG/2ML IJ SOLN
INTRAMUSCULAR | Status: AC
  Filled 2018-08-19: qty 2

## 2018-08-19 MED ORDER — LACTATED RINGERS IV SOLN
INTRAVENOUS | Status: DC | PRN
  Administered 2018-08-19: 19:00:00 via INTRAVENOUS

## 2018-08-19 MED ORDER — HYDROMORPHONE HCL 1 MG/ML IJ SOLN
0.5000 mg | INTRAMUSCULAR | Status: DC | PRN
  Administered 2018-08-19: 0.5 mg via INTRAVENOUS
  Administered 2018-08-19 – 2018-08-21 (×13): 1 mg via INTRAVENOUS
  Filled 2018-08-19 (×14): qty 1

## 2018-08-19 MED ORDER — PROMETHAZINE HCL 25 MG/ML IJ SOLN
INTRAMUSCULAR | Status: AC
  Filled 2018-08-19: qty 1

## 2018-08-19 MED ORDER — PHENYLEPHRINE 40 MCG/ML (10ML) SYRINGE FOR IV PUSH (FOR BLOOD PRESSURE SUPPORT)
PREFILLED_SYRINGE | INTRAVENOUS | Status: AC
  Filled 2018-08-19: qty 10

## 2018-08-19 MED ORDER — SUCCINYLCHOLINE CHLORIDE 200 MG/10ML IV SOSY
PREFILLED_SYRINGE | INTRAVENOUS | Status: DC | PRN
  Administered 2018-08-19: 100 mg via INTRAVENOUS

## 2018-08-19 SURGICAL SUPPLY — 15 items

## 2018-08-19 SURGICAL SUPPLY — 26 items
BLADE 10 SAFETY STRL DISP (BLADE) ×3 IMPLANT
BLADE SURG 15 STRL LF DISP TIS (BLADE) IMPLANT
BLADE SURG 15 STRL SS (BLADE)
CANISTER SUCT 3000ML PPV (MISCELLANEOUS) ×3 IMPLANT
CATH EMB 6FR 80CM (CATHETERS) ×2 IMPLANT
CONT SPEC 4OZ CLIKSEAL STRL BL (MISCELLANEOUS) IMPLANT
COVER BACK TABLE 60X90IN (DRAPES) ×3 IMPLANT
COVER WAND RF STERILE (DRAPES) ×3 IMPLANT
DRAPE HALF SHEET 40X57 (DRAPES) ×3 IMPLANT
GAUZE SPONGE 4X4 12PLY STRL (GAUZE/BANDAGES/DRESSINGS) ×3 IMPLANT
GLOVE BIO SURGEON STRL SZ7.5 (GLOVE) ×3 IMPLANT
GOWN STRL REUS W/ TWL LRG LVL3 (GOWN DISPOSABLE) ×2 IMPLANT
GOWN STRL REUS W/TWL LRG LVL3 (GOWN DISPOSABLE) ×6
KIT TURNOVER KIT B (KITS) ×3 IMPLANT
MARKER SKIN DUAL TIP RULER LAB (MISCELLANEOUS) IMPLANT
NDL HYPO 25GX1X1/2 BEV (NEEDLE) IMPLANT
NEEDLE HYPO 25GX1X1/2 BEV (NEEDLE) IMPLANT
NS IRRIG 1000ML POUR BTL (IV SOLUTION) ×3 IMPLANT
PAD ARMBOARD 7.5X6 YLW CONV (MISCELLANEOUS) ×6 IMPLANT
PATTIES SURGICAL .5 X1 (DISPOSABLE) IMPLANT
SOLUTION ANTI FOG 6CC (MISCELLANEOUS) IMPLANT
SURGILUBE 2OZ TUBE FLIPTOP (MISCELLANEOUS) ×3 IMPLANT
TOWEL OR 17X24 6PK STRL BLUE (TOWEL DISPOSABLE) ×6 IMPLANT
TUBE CONNECTING 12'X1/4 (SUCTIONS) ×1
TUBE CONNECTING 12X1/4 (SUCTIONS) ×2 IMPLANT
WATER STERILE IRR 1000ML POUR (IV SOLUTION) ×3 IMPLANT

## 2018-08-19 NOTE — Anesthesia Postprocedure Evaluation (Signed)
Anesthesia Post Note  Patient: David Mcconnell  Procedure(s) Performed: ESOPHAGOGASTRODUODENOSCOPY (EGD) WITH PROPOFOL (N/A )     Patient location during evaluation: PACU Anesthesia Type: General Level of consciousness: awake and alert Pain management: pain level controlled Vital Signs Assessment: post-procedure vital signs reviewed and stable Respiratory status: spontaneous breathing, nonlabored ventilation and respiratory function stable Cardiovascular status: blood pressure returned to baseline and stable Postop Assessment: no apparent nausea or vomiting Anesthetic complications: no    Last Vitals:  Vitals:   08/19/18 1445 08/19/18 1450  BP: (!) 148/88   Pulse: (!) 108 77  Resp: 15 19  Temp:    SpO2: 95% 98%    Last Pain:  Vitals:   08/19/18 1424  TempSrc: Oral  PainSc: 8                  Finley Chevez,W. EDMOND

## 2018-08-19 NOTE — Care Management Note (Signed)
Case Management Note  Patient Details  Name: David Mcconnell MRN: [id number] Date of Birth: [birth year]  Subjective/Objective:   Pt admitted on 08/14/18 s/p self inflicted bowel evisceration, s/p exp lap on 2/26.  PTA, pt was in police custody.                 Action/Plan: Pt has been cleared by psych MD for return to corrections facility.  He continues to ingest foreign bodies in an attempt to not return to jail.  Possible discharge later today after EGD.     Expected Discharge Date:                  Expected Discharge Plan:  Corrections Facility  In-House Referral:  Clinical Social Work  Discharge planning Services  CM Consult  Post Acute Care Choice:    Choice offered to:     DME Arranged:    DME Agency:     HH Arranged:    HH Agency:     Status of Service:  Completed, signed off  If discussed at [employer], dates discussed:    Additional Comments:  Quintella Baton, RN, BSN  Trauma/Neuro ICU Case Manager [phone]

## 2018-08-19 NOTE — Transfer of Care (Signed)
Immediate Anesthesia Transfer of Care Note  Patient: David Mcconnell  Procedure(s) Performed: RIGID ESOPHAGOSCOPY with removal of foreign body (N/A )  Patient Location: PACU  Anesthesia Type:General  Level of Consciousness: oriented, sedated, drowsy, patient cooperative and responds to stimulation  Airway & Oxygen Therapy: Patient Spontanous Breathing and Patient connected to nasal cannula oxygen  Post-op Assessment: Report given to RN, Post -op Vital signs reviewed and stable and Patient moving all extremities X 4  Post vital signs: Reviewed and stable  Last Vitals:  Vitals Value Taken Time  BP 141/78 08/19/2018  7:35 PM  Temp    Pulse 70 08/19/2018  7:38 PM  Resp 14 08/19/2018  7:38 PM  SpO2 100 % 08/19/2018  7:38 PM  Vitals shown include unvalidated device data.  Last Pain:  Vitals:   08/19/18 1539  TempSrc: Axillary  PainSc:       Patients Stated Pain Goal: 0 (08/19/18 1249)  Complications: No apparent anesthesia complications

## 2018-08-19 NOTE — Progress Notes (Cosign Needed)
Dr Lavon Paganini was called last PM for pt who swallowed FB.  Case d/w Dr Marina Goodell this AM.   [facility] is not on call for unassigned pt's.  We will contact Surgery to call consult to [facility] who are on U/A call.   Jennye Moccasin PA-C [phone].

## 2018-08-19 NOTE — Anesthesia Preprocedure Evaluation (Addendum)
Anesthesia Evaluation  Patient identified by MRN, date of birth, ID band Patient awake    Reviewed: Allergy & Precautions, NPO status , Patient's Chart, lab work & pertinent test results  History of Anesthesia Complications Negative for: history of anesthetic complications  Airway Mallampati: II  TM Distance: >3 FB Neck ROM: Full    Dental  (+) Teeth Intact, Dental Advisory Given   Pulmonary Current Smoker,    Pulmonary exam normal breath sounds clear to auscultation       Cardiovascular negative cardio ROS Normal cardiovascular exam Rhythm:Regular Rate:Normal     Neuro/Psych Anxiety Depression Hx of multiple foreign body ingestions and self-evisceration of bowel s/p ex lap 2/26/20negative neurological ROS     GI/Hepatic negative GI ROS, (+)     substance abuse  cocaine use,   Endo/Other  negative endocrine ROS  Renal/GU negative Renal ROS     Musculoskeletal negative musculoskeletal ROS (+)   Abdominal   Peds  Hematology  (+) Blood dyscrasia, anemia ,   Anesthesia Other Findings Day of surgery medications reviewed with the patient.  Reproductive/Obstetrics                           Anesthesia Physical Anesthesia Plan  ASA: II  Anesthesia Plan: General   Post-op Pain Management:    Induction: Intravenous, Rapid sequence and Cricoid pressure planned  PONV Risk Score and Plan: 1 and Ondansetron, Dexamethasone and Midazolam  Airway Management Planned: Oral ETT  Additional Equipment: None  Intra-op Plan:   Post-operative Plan: Extubation in OR  Informed Consent: I have reviewed the patients History and Physical, chart, labs and discussed the procedure including the risks, benefits and alternatives for the proposed anesthesia with the patient or authorized representative who has indicated his/her understanding and acceptance.     Dental advisory given  Plan Discussed with:  CRNA, Anesthesiologist and Surgeon  Anesthesia Plan Comments:     Anesthesia Quick Evaluation

## 2018-08-19 NOTE — Op Note (Addendum)
[facility] Patient Name: David Mcconnell Procedure Date : 08/19/2018 MRN: [id number] Attending MD: Willis Modena , MD Date of Birth: 05-Oct-1980 CSN: [id number] Age: 38 Admit Type: Inpatient Procedure:                Upper GI endoscopy Indications:              Foreign body in the esophagus, Foreign body in the                            stomach Providers:                Willis Modena, MD, Bonney Leitz, Harold Barban,                            RN Referring MD:              Medicines:                General Anesthesia Complications:            No immediate complications. Estimated Blood Loss:     Estimated blood loss: none. Procedure:                Pre-Anesthesia Assessment:                           - Prior to the procedure, a History and Physical                            was performed, and patient medications and                            allergies were reviewed. The patient's tolerance of                            previous anesthesia was also reviewed. The risks                            and benefits of the procedure and the sedation                            options and risks were discussed with the patient.                            All questions were answered, and informed consent                            was obtained. Prior Anticoagulants: The patient has                            taken no previous anticoagulant or antiplatelet                            agents. ASA Grade Assessment: II - A patient with                            mild  systemic disease. After reviewing the risks                            and benefits, the patient was deemed in                            satisfactory condition to undergo the procedure.                           After obtaining informed consent, the endoscope was                            passed under direct vision. Throughout the                            procedure, the patient's blood pressure, pulse, and                    oxygen saturations were monitored continuously. The                            GIF-H190 ([id number]) Olympus gastroscope was                            introduced through the mouth, and advanced to the                            cricopharyngeal esophagus. The upper GI endoscopy                            was accomplished without difficulty. The upper GI                            endoscopy was performed with difficulty. The                            patient tolerated the procedure well. Scope In: Scope Out: Findings:      Suspected stopcock apparatus from IV tubing were found at the       cricopharyngeus and in the upper third of the esophagus. Some blood and       edema seen in region of the foreign body and in the oropharynx.       Principal axis of the stopcock was perpendicular to the axis of the       esophagus. Foreign body is deeply wedged into the proximal esophagus       upper esophageal sphincter area. Was not mobile to better orient in       position more favorable for extraction. Tried to manipulate a few times,       but this apparatus is wedged tight at upper esophageal sphincter       cricopharyngeal area. Impression:               - Suspected IV tubing stopcock-type apparatus were                            found in the esophagus.                           -  Esophageal foreign body at upper esophageal                            sphincter + cricopharyngeus unable to be extracted.                            Any type of traction pressure to pull this foreign                            body out would carry high risk of perforation. Recommendation:           - Watch for potential complications of procedure.                           - Case discussed with surgical team; patient will                            need ENT consultation, as the foreign esophageal                            body is unable to be removed endoscopically.                           - Can't  get to the gastric foreign body (pulse                            oximetry wires); but apparently these have been in                            the stomach for several days, and can be removed                            electively over the next few days after the                            patient's esophageal foreign body has been removed.                           -[facility] will be on standby, anticipating removing                            the gastric foreign body sometime in the next few                            days. Procedure Code(s):        --- Professional ---                           43200, Esophagoscopy, flexible, transoral;                            diagnostic, including collection of specimen(s) by  brushing or washing, when performed (separate                            procedure) Diagnosis Code(s):        --- Professional ---                           [phone], Other foreign object in esophagus causing                            other injury, initial encounter                           T18.108A, Unspecified foreign body in esophagus                            causing other injury, initial encounter                           T18.2XXA, Foreign body in stomach, initial encounter CPT copyright 2018 [employer]. All rights reserved. The codes documented in this report are preliminary and upon coder review may  be revised to meet current compliance requirements. Willis Modena, MD 08/19/2018 2:38:03 PM This report has been signed electronically. Number of Addenda: 0

## 2018-08-19 NOTE — Progress Notes (Signed)
Spoke with Dr. Doylene Canard and MD gave order to hold PO meds stating that patient will probably be transferred to [facility] tomorrow.

## 2018-08-19 NOTE — Anesthesia Preprocedure Evaluation (Addendum)
Anesthesia Evaluation  Patient identified by MRN, date of birth, ID band Patient awake    Reviewed: Allergy & Precautions, H&P , NPO status , Patient's Chart, lab work & pertinent test results  Airway Mallampati: III  TM Distance: >3 FB Neck ROM: Full    Dental no notable dental hx. (+) Teeth Intact, Dental Advisory Given   Pulmonary Current Smoker,    Pulmonary exam normal breath sounds clear to auscultation       Cardiovascular negative cardio ROS   Rhythm:Regular Rate:Normal     Neuro/Psych Anxiety Depression negative neurological ROS     GI/Hepatic negative GI ROS, Neg liver ROS,   Endo/Other  negative endocrine ROS  Renal/GU negative Renal ROS  negative genitourinary   Musculoskeletal   Abdominal   Peds  Hematology negative hematology ROS (+)   Anesthesia Other Findings   Reproductive/Obstetrics negative OB ROS                            Anesthesia Physical Anesthesia Plan  ASA: II  Anesthesia Plan: General   Post-op Pain Management:    Induction: Intravenous, Rapid sequence and Cricoid pressure planned  PONV Risk Score and Plan: 2 and Ondansetron, Dexamethasone and Midazolam  Airway Management Planned: Oral ETT  Additional Equipment:   Intra-op Plan:   Post-operative Plan: Extubation in OR  Informed Consent: I have reviewed the patients History and Physical, chart, labs and discussed the procedure including the risks, benefits and alternatives for the proposed anesthesia with the patient or authorized representative who has indicated his/her understanding and acceptance.     Dental advisory given  Plan Discussed with: CRNA  Anesthesia Plan Comments:        Anesthesia Quick Evaluation
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
## 2018-08-19 NOTE — Op Note (Signed)
DATE OF PROCEDURE:  08/19/2018                              OPERATIVE REPORT  SURGEON:  Newman Pies, MD  PREOPERATIVE DIAGNOSES: 1.  Esophageal foreign body.  POSTOPERATIVE DIAGNOSES: 1.  Esophageal foreign body.  PROCEDURE PERFORMED: Rigid esophagoscopy with unsuccessful foreign body removal.  ANESTHESIA:  General endotracheal tube anesthesia.  COMPLICATIONS:  None.  ESTIMATED BLOOD LOSS:  None  INDICATION FOR PROCEDURE:  David Mcconnell is a 38 y.o. male with a history of frequent foreign body ingestion. Pt swallowed a stopcock apparatus from IV tubing earlier today. It was lodged in his proximal esophagus. Attempt to remove the stopcock apparatus by GI was unsuccessful. The foreign body was deeply wedged into the proximal esophagus.  Based on the above findings, the decision was made for the patient to undergo the above stated procedure. Emergency consent was obtained.  DESCRIPTION:  The patient was taken to the operating room and placed supine on the operating table.  General endotracheal tube anesthesia was administered by the anesthesiologist.  The patient was positioned and prepped and draped in a standard fashion for rigid esophagoscopy.   The rigid esophagoscope was inserted via the oral cavity into the esophageal entrance.  The esophagoscope was inserted into the proximal esophagus, and a plastic foreign body was noted. The foreign body appeared to be a stopcock apparatus from IV tubing. The foreign body was deeply wedged into the esophageal muscle, with surrounding polypoid and inflamed mucosa.  Attempts to move the foreign body downward or upward were unsuccessful. A thrombectomy catheter was inserted past the foreign body. The balloon was inflated and the catheter withdrawn. Despite applying significant force, the foreign body could not be pulled out of the esophagus. To avoid to the risk of perforating the esophagus, the procedure was aborted.   The care of the patient was turned  over to the anesthesiologist.  The patient was awakened from anesthesia without difficulty.  The patient was extubated and transferred to the recovery room in good condition.  OPERATIVE FINDINGS:  Deeply wedged esophageal foreign body.   SPECIMEN:  None  FOLLOWUP CARE:  The patient will be returned to his hospital bed. He may need to undergo partial esophagectomy to remove the foreign body.  David Mcconnell 08/19/2018 7:51 PM
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
## 2018-08-19 NOTE — Interval H&P Note (Signed)
History and Physical Interval Note:  08/19/2018 1:40 PM  David Mcconnell  has presented today for surgery, with the diagnosis of Gastric foreign body  The various methods of treatment have been discussed with the patient and family. After consideration of risks, benefits and other options for treatment, the patient has consented to  Procedure(s): ESOPHAGOGASTRODUODENOSCOPY (EGD) WITH PROPOFOL (N/A) as a surgical intervention .  The patient's history has been reviewed, patient examined, no change in status, stable for surgery.  I have reviewed the patient's chart and labs.  Questions were answered to the patient's satisfaction.     Freddy Jaksch

## 2018-08-19 NOTE — Progress Notes (Addendum)
Patient ID: David Mcconnell, male   DOB: 1980-10-25, 38 y.o.   MRN: [id number]    5 Days Post-Op  Subjective: Patient states he swallowed the pulse ox cord over a week ago before he was discharged from here the last time.  Complains of swallowing the top off of his IV yesterday and that he feels like it is still stuck in his throat.    Objective: Vital signs in last 24 hours: Temp:  [98 F (36.7 C)] 98 F (36.7 C) (03/01 1626) Pulse Rate:  [56] 56 (03/01 1626) Resp:  [19] 19 (03/01 1626) BP: (126)/(79) 126/79 (03/01 1626) SpO2:  [100 %] 100 % (03/01 1626) Last BM Date: 08/14/18  Intake/Output from previous day: 03/01 0701 - 03/02 0700 In: 3624.4 [I.V.:3624.4] Out: 2700 [Urine:2700] Intake/Output this shift: No intake/output data recorded.  PE: Heart: regular Lungs: CTAB Abd: soft, appropriately tender, +BS, ND, incision appears tight causing some breakdown of the superficial skin.  Now with some serosang and fibrinous drainage.       Lab Results:  No results for input(s): WBC, HGB, HCT, PLT in the last 72 hours. BMET No results for input(s): NA, K, CL, CO2, GLUCOSE, BUN, CREATININE, CALCIUM in the last 72 hours. PT/INR No results for input(s): LABPROT, INR in the last 72 hours. CMP     Component Value Date/Time   NA 140 08/15/2018 0514   K 4.2 08/15/2018 0514   CL 104 08/15/2018 0514   CO2 26 08/15/2018 0514   GLUCOSE 119 (H) 08/15/2018 0514   BUN 6 08/15/2018 0514   CREATININE 1.04 08/15/2018 0514   CALCIUM 8.9 08/15/2018 0514   PROT 6.0 (L) 08/15/2018 0514   ALBUMIN 3.0 (L) 08/15/2018 0514   AST 21 08/15/2018 0514   ALT 18 08/15/2018 0514   ALKPHOS 51 08/15/2018 0514   BILITOT 0.3 08/15/2018 0514   GFRNONAA >60 08/15/2018 0514   GFRAA >60 08/15/2018 0514   Lipase  No results found for: LIPASE     Studies/Results: Dg Abd Portable 1v  Result Date: 08/18/2018 CLINICAL DATA:  Abdominal pain. EXAM: PORTABLE ABDOMEN - 1 VIEW COMPARISON:  None. FINDINGS:  The bowel gas pattern is unremarkable. No dilated bowel loops are identified. Surgical staples are present. IMPRESSION: Unremarkable bowel gas pattern. Electronically Signed   By: Harmon Pier M.D.   On: 08/18/2018 11:49    Anti-infectives: Anti-infectives (From admission, onward)   Start     Dose/Rate Route Frequency Ordered Stop   08/14/18 1730  vancomycin (VANCOCIN) 2,000 mg in sodium chloride 0.9 % 500 mL IVPB     2,000 mg 250 mL/hr over 120 Minutes Intravenous STAT 08/14/18 1658 08/15/18 1730   08/14/18 1645  piperacillin-tazobactam (ZOSYN) IVPB 3.375 g     3.375 g 100 mL/hr over 30 Minutes Intravenous  Once 08/14/18 1640 08/14/18 1931   08/14/18 1645  vancomycin (VANCOCIN) 1,814 mg in sodium chloride 0.9 % 500 mL IVPB  Status:  Discontinued     20 mg/kg  90.7 kg 250 mL/hr over 120 Minutes Intravenous  Once 08/14/18 1640 08/14/18 1658       Assessment/Plan Self-inflicted bowel evisceration POD4, s/p ex lap with primary repair of bowel injury, Dr. Cliffton Asters 2/26- -adv diet once returns from endo. -patient swallowed the cap to an IV yesterday.  This will likely pass on it's own if not seen in stomach today on endoscopy. -film from yesterday still shows pulse ox likely in his stomach.  Repeat film pending per Dr. Donavan Burnet  request. -wound is a little soupy, likely from sutures.  May plan to DC these today. Foreign body ingestion -pulse ox over a week ago.  Likely to endo today for retrieval Psychiatric DO(borderline personality disorder) - -psychhas cleared patient.cont psych meds. -all tele and IVs have been discontinued and he has minimal access to objects that can be swallowed.   FEN -NPO for endo, soft diet upon return. VTE -Lovenox ID- Zosyn 2/26 -->2/27    LOS: 5 days    Letha Cape , [facility] 08/19/2018, 9:17 AM Pager: [phone]

## 2018-08-19 NOTE — Transfer of Care (Signed)
Immediate Anesthesia Transfer of Care Note  Patient: David Mcconnell  Procedure(s) Performed: ESOPHAGOGASTRODUODENOSCOPY (EGD) WITH PROPOFOL (N/A )  Patient Location: Endoscopy Unit  Anesthesia Type:General  Level of Consciousness: awake, alert , oriented and patient cooperative  Airway & Oxygen Therapy: Patient Spontanous Breathing and Patient connected to nasal cannula oxygen  Post-op Assessment: Report given to RN, Post -op Vital signs reviewed and stable and Patient moving all extremities X 4  Post vital signs: Reviewed and stable  Last Vitals:  Vitals Value Taken Time  BP 150/85 08/19/2018  2:24 PM  Temp 37 C 08/19/2018  2:24 PM  Pulse 81 08/19/2018  2:30 PM  Resp 18 08/19/2018  2:30 PM  SpO2 100 % 08/19/2018  2:30 PM  Vitals shown include unvalidated device data.  Last Pain:  Vitals:   08/19/18 1424  TempSrc: Oral  PainSc: 8       Patients Stated Pain Goal: 0 (08/19/18 1249)  Complications: No apparent anesthesia complications

## 2018-08-19 NOTE — Anesthesia Procedure Notes (Signed)
Procedure Name: Intubation Date/Time: 08/19/2018 1:44 PM Performed by: Carney Living, CRNA Pre-anesthesia Checklist: Patient identified, Emergency Drugs available, Suction available, Patient being monitored and Timeout performed Patient Re-evaluated:Patient Re-evaluated prior to induction Oxygen Delivery Method: Circle system utilized Preoxygenation: Pre-oxygenation with 100% oxygen Induction Type: IV induction, Rapid sequence and Cricoid Pressure applied Laryngoscope Size: Mac and 4 Grade View: Grade II Tube type: Oral Tube size: 7.5 mm Number of attempts: 1 Airway Equipment and Method: Stylet

## 2018-08-19 NOTE — Anesthesia Postprocedure Evaluation (Signed)
Anesthesia Post Note  Patient: RAWLINGS SCHER  Procedure(s) Performed: RIGID ESOPHAGOSCOPY with removal of foreign body (N/A )     Patient location during evaluation: PACU Anesthesia Type: General Level of consciousness: awake and alert Pain management: pain level controlled Vital Signs Assessment: post-procedure vital signs reviewed and stable Respiratory status: spontaneous breathing, nonlabored ventilation, respiratory function stable and patient connected to nasal cannula oxygen Cardiovascular status: blood pressure returned to baseline and stable Postop Assessment: no apparent nausea or vomiting Anesthetic complications: no    Last Vitals:  Vitals:   08/19/18 2005 08/19/18 2023  BP: (!) 156/96 (!) 145/94  Pulse: (!) 51 (!) 56  Resp: 12 16  Temp: 36.6 C 37.3 C  SpO2: 100% 100%    Last Pain:  Vitals:   08/19/18 2023  TempSrc: Axillary  PainSc:                  Shelton Silvas
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
## 2018-08-19 NOTE — Anesthesia Procedure Notes (Signed)
Procedure Name: Intubation Date/Time: 08/19/2018 7:00 PM Performed by: Claris Che, CRNA Pre-anesthesia Checklist: Patient identified, Emergency Drugs available, Suction available, Patient being monitored and Timeout performed Patient Re-evaluated:Patient Re-evaluated prior to induction Oxygen Delivery Method: Circle system utilized Preoxygenation: Pre-oxygenation with 100% oxygen Induction Type: IV induction, Rapid sequence and Cricoid Pressure applied Laryngoscope Size: Mac and 3 Tube type: Oral Tube size: 7.5 mm Number of attempts: 1 Airway Equipment and Method: Stylet Placement Confirmation: ETT inserted through vocal cords under direct vision,  positive ETCO2 and breath sounds checked- equal and bilateral Secured at: 24 cm Tube secured with: Tape Dental Injury: Teeth and Oropharynx as per pre-operative assessment

## 2018-08-19 NOTE — Progress Notes (Signed)
Midline sutures removed from wound.  Staples remain in place and are intact.  Some serous fluid was evacuated through staples once sutures removed.  This was not infected or purulent appearing.  Letha Cape 11:10 AM 08/19/2018

## 2018-08-20 ENCOUNTER — Inpatient Hospital Stay (HOSPITAL_COMMUNITY)

## 2018-08-20 ENCOUNTER — Encounter (HOSPITAL_COMMUNITY): Payer: Self-pay | Admitting: Otolaryngology

## 2018-08-20 DIAGNOSIS — T189XXA Foreign body of alimentary tract, part unspecified, initial encounter: Secondary | ICD-10-CM

## 2018-08-20 LAB — CBC
HCT: 31.4 % — ABNORMAL LOW (ref 39.0–52.0)
Hemoglobin: 9.5 g/dL — ABNORMAL LOW (ref 13.0–17.0)
MCH: 23.1 pg — ABNORMAL LOW (ref 26.0–34.0)
MCHC: 30.3 g/dL (ref 30.0–36.0)
MCV: 76.4 fL — ABNORMAL LOW (ref 80.0–100.0)
Platelets: 337 10*3/uL (ref 150–400)
RBC: 4.11 MIL/uL — ABNORMAL LOW (ref 4.22–5.81)
RDW: 15.4 % (ref 11.5–15.5)
WBC: 7.7 10*3/uL (ref 4.0–10.5)
nRBC: 0 % (ref 0.0–0.2)

## 2018-08-20 LAB — BASIC METABOLIC PANEL
Anion gap: 12 (ref 5–15)
BUN: <5 mg/dL — ABNORMAL LOW (ref 6–20)
CO2: 26 mmol/L (ref 22–32)
Calcium: 9 mg/dL (ref 8.9–10.3)
Chloride: 101 mmol/L (ref 98–111)
Creatinine, Ser: 1.15 mg/dL (ref 0.61–1.24)
GFR calc Af Amer: >60 mL/min (ref >60)
GFR calc non Af Amer: >60 mL/min (ref >60)
Glucose, Bld: 99 mg/dL (ref 70–99)
Potassium: 3.7 mmol/L (ref 3.5–5.1)
Sodium: 139 mmol/L (ref 135–145)

## 2018-08-20 MED ORDER — LORAZEPAM 2 MG/ML IJ SOLN
0.5000 mg | Freq: Four times a day (QID) | INTRAMUSCULAR | Status: DC | PRN
  Administered 2018-08-20 – 2018-08-21 (×6): 0.5 mg via INTRAVENOUS
  Filled 2018-08-20 (×6): qty 1

## 2018-08-20 MED ORDER — DIPHENHYDRAMINE HCL 50 MG/ML IJ SOLN
12.5000 mg | Freq: Four times a day (QID) | INTRAMUSCULAR | Status: DC | PRN
  Administered 2018-08-20 – 2018-08-21 (×6): 12.5 mg via INTRAVENOUS
  Filled 2018-08-20 (×6): qty 1

## 2018-08-20 MED ORDER — IOHEXOL 300 MG/ML  SOLN
75.0000 mL | Freq: Once | INTRAMUSCULAR | Status: AC | PRN
  Administered 2018-08-20: 75 mL via INTRAVENOUS

## 2018-08-20 MED ORDER — POTASSIUM CHLORIDE 2 MEQ/ML IV SOLN
INTRAVENOUS | Status: DC
  Filled 2018-08-20: qty 1000

## 2018-08-20 MED ORDER — KCL IN DEXTROSE-NACL 20-5-0.45 MEQ/L-%-% IV SOLN
INTRAVENOUS | Status: DC
  Administered 2018-08-20 – 2018-08-24 (×6): via INTRAVENOUS
  Filled 2018-08-20 (×10): qty 1000

## 2018-08-20 NOTE — Progress Notes (Signed)
Patient complaining of new onset upper right chest pain which radiates around shoulder and back. BP 122/70, SP02 100%, HR 68. Patient given IV Dilaudid 1mg  @ 2016 and cannot be given until 2316. On call for Trauma paged for FYI- no orders received.   Will continue to monitor.

## 2018-08-20 NOTE — Progress Notes (Signed)
Flo Shanks, RN with [facility] called about patient and inquired if patient would be transferring to [facility] in [place]. I called Flo Shanks, RN at [phone] and told her that patient would remain at [facility].

## 2018-08-20 NOTE — Progress Notes (Signed)
Paged and Dr. Fredricka Bonine called back.  Made MD aware that patient asking for benadryl for itching and that it is ordered PO and made MD aware that patient wants ativan changed to IV.  MD gave order for IV benadryl 12.5 mg q6H PRN itching and IV ativan 0.5 mg q6H PRN anxiety.

## 2018-08-20 NOTE — Progress Notes (Signed)
Patient ID: David Mcconnell, male   DOB: September 20, 1980, 38 y.o.   MRN: [id number] 1 Day Post-Op  Subjective: C/O some throat pain  Objective: Vital signs in last 24 hours: Temp:  [97.8 F (36.6 C)-100.4 F (38 C)] 98.4 F (36.9 C) (03/03 0804) Pulse Rate:  [51-108] 64 (03/03 0804) Resp:  [11-27] 11 (03/03 0804) BP: (135-156)/(78-96) 143/87 (03/03 0804) SpO2:  [95 %-100 %] 100 % (03/03 0804) Last BM Date: 08/14/18  Intake/Output from previous day: 03/02 0701 - 03/03 0700 In: 1731.8 [I.V.:1731.8] Out: 1525 [Urine:1525] Intake/Output this shift: No intake/output data recorded.  General appearance: cooperative Neck: no crepitance Resp: clear to auscultation bilaterally Cardio: regular rate and rhythm GI: soft, some SS drainage from wound  Lab Results: CBC  Recent Labs    08/20/18 0211  WBC 7.7  HGB 9.5*  HCT 31.4*  PLT 337   BMET Recent Labs    08/20/18 0211  NA 139  K 3.7  CL 101  CO2 26  GLUCOSE 99  BUN <5*  CREATININE 1.15  CALCIUM 9.0   PT/INR No results for input(s): LABPROT, INR in the last 72 hours. ABG No results for input(s): PHART, HCO3 in the last 72 hours.  Invalid input(s): PCO2, PO2  Studies/Results: Dg Abd 2 Views  Result Date: 08/19/2018 CLINICAL DATA:  Foreign body evaluation. EXAM: ABDOMEN - 2 VIEW COMPARISON:  08/18/2018 FINDINGS: Surgical staples over the midline of the mid abdomen. 6-7 mm linear metallic density over the mid abdomen right of midline unchanged compatible with foreign body. Second 6-7 mm radiopaque foreign body projects over the descending colon likely a small screw. Coil catheter unchanged over the left upper quadrant. Surgical suture line over the bowel in the right lower quadrant. No free peritoneal air. Several air-filled loops of large and small bowel. There is a central air-filled small bowel loop just left of midline slightly dilated measuring 4.6 cm which may be due to postoperative ileus. Remainder of the exam is  unchanged. IMPRESSION: Air-filled large and small bowel loops with a single air-filled small bowel loop left of midline in the mid abdomen slightly dilated and likely due to postoperative ileus. Two small 6-7 mm foreign bodies as described. Coil catheter over the left upper quadrant unchanged. Electronically Signed   By: Elberta Fortis M.D.   On: 08/19/2018 09:51   Dg Abd Portable 1v  Result Date: 08/18/2018 CLINICAL DATA:  Abdominal pain. EXAM: PORTABLE ABDOMEN - 1 VIEW COMPARISON:  None. FINDINGS: The bowel gas pattern is unremarkable. No dilated bowel loops are identified. Surgical staples are present. IMPRESSION: Unremarkable bowel gas pattern. Electronically Signed   By: Harmon Pier M.D.   On: 08/18/2018 11:49    Anti-infectives: Anti-infectives (From admission, onward)   Start     Dose/Rate Route Frequency Ordered Stop   08/14/18 1730  vancomycin (VANCOCIN) 2,000 mg in sodium chloride 0.9 % 500 mL IVPB     2,000 mg 250 mL/hr over 120 Minutes Intravenous STAT 08/14/18 1658 08/15/18 1730   08/14/18 1645  piperacillin-tazobactam (ZOSYN) IVPB 3.375 g     3.375 g 100 mL/hr over 30 Minutes Intravenous  Once 08/14/18 1640 08/14/18 1931   08/14/18 1645  vancomycin (VANCOCIN) 1,814 mg in sodium chloride 0.9 % 500 mL IVPB  Status:  Discontinued     20 mg/kg  90.7 kg 250 mL/hr over 120 Minutes Intravenous  Once 08/14/18 1640 08/14/18 1658      Assessment/Plan: Self-inflicted bowel evisceration s/p ex lap with primary  repair of bowel injury, Dr. Cliffton Asters 2/26- - wound with some drainage but no cellulitis Foreign body ingestion -pulse ox over a week ago, remains in stomach. Removal by GI in Endo at some point. - IV stopcock is lodged in the upper esophagus. Could not be removed in endo by GI nor in the OR by ENT yesterday. I consulted [facility]. Dr. Donata Clay recommends transfer to [facility]  -all tele and IVs have been discontinued and he has minimal access to objects that can be  swallowed. Psychiatric DO(borderline personality disorder) - -psychhas cleared patient.cont psych meds. FEN -NPO, increase IVF VTE -Lovenox Dispo - I will call [facility] for transfer  LOS: 6 days    Violeta Gelinas, MD, MPH, FACS Trauma: [phone] General Surgery: [phone]  08/20/2018

## 2018-08-20 NOTE — Progress Notes (Signed)
1 Day Post-Op Procedure(s) (LRB): RIGID ESOPHAGOSCOPY with removal of foreign body (N/A) Subjective: Patient examined, medical record reviewed, most recent x-ray images personally reviewed.  Patient has a plastic stopcock caught above the cricopharyngeus int thecervical esophagus.  Attempted by gastroenterology to remove with endoscope-- foreign body was visualized but could not be pulled out.  Patient underwent rigid esophagoscopy cervical esophagus by ENT-foreign body was visualized but was not able to be pulled back or advanced forward.  Opinion regarding esophagotomy to remove foreign object requested.  At this point the patient has no evidence of perforation-no fever or white count, no crepitus in the neck or subcutaneous emphysema.  He does have difficulty swallowing his saliva and uses a spit cup.  Because of the patient's psychological problems open surgery should be avoided-he previously has stuck a finger through an abdominal surgical wound injuring his bowel. I would recommend transferring patient to a  [facility] for advanced expertise removing foreign bodies from the esophagus without open surgery. Objective: Vital signs in last 24 hours: Temp:  [97.8 F (36.6 C)-100.4 F (38 C)] 98.4 F (36.9 C) (03/03 0804) Pulse Rate:  [51-108] 64 (03/03 0804) Cardiac Rhythm: Sinus bradycardia (03/03 0900) Resp:  [11-27] 11 (03/03 0804) BP: (135-156)/(78-96) 143/87 (03/03 0804) SpO2:  [95 %-100 %] 100 % (03/03 0804)  Hemodynamic parameters for last 24 hours:    Intake/Output from previous day: 03/02 0701 - 03/03 0700 In: 1731.8 [I.V.:1731.8] Out: 1525 [Urine:1525] Intake/Output this shift: No intake/output data recorded.  Patient resting comfortably alert and oriented Complains of upper neck pain and pain with swallowing-odynophagia No fluctuance or crepitus in the neck No tenderness of the anterior chest-mediastinum Heart rhythm regular without murmur Breath sounds  clear Surgical dressing on abdominal wound Extremities warm with good pulses No neuro deficit  Lab Results: Recent Labs    08/20/18 0211  WBC 7.7  HGB 9.5*  HCT 31.4*  PLT 337   BMET:  Recent Labs    08/20/18 0211  NA 139  K 3.7  CL 101  CO2 26  GLUCOSE 99  BUN <5*  CREATININE 1.15  CALCIUM 9.0    PT/INR: No results for input(s): LABPROT, INR in the last 72 hours. ABG    Component Value Date/Time   PHART 7.411 08/11/2018 1432   HCO3 23.4 08/11/2018 1432   TCO2 24 08/11/2018 1432   ACIDBASEDEF 1.0 08/11/2018 1432   O2SAT 83.0 08/11/2018 1432   CBG (last 3)  No results for input(s): GLUCAP in the last 72 hours.  Assessment/Plan: S/P Procedure(s) (LRB): RIGID ESOPHAGOSCOPY with removal of foreign body (N/A) Recommend rechecking x-rays to localize current position of the foreign body Recommend transfer to a [facility] with expertise for removal of esophageal foreign bodies without open surgery Low threshold to start IV antibiotics-Zosyn for any fever or white count elevation   LOS: 6 days    Kathlee Nations Trigt III 08/20/2018
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
## 2018-08-21 ENCOUNTER — Inpatient Hospital Stay (HOSPITAL_COMMUNITY): Admitting: Registered Nurse

## 2018-08-21 ENCOUNTER — Encounter (HOSPITAL_COMMUNITY): Payer: Self-pay | Admitting: Registered Nurse

## 2018-08-21 ENCOUNTER — Encounter (HOSPITAL_COMMUNITY): Admission: EM | Disposition: A | Discharge status: Home / Self Care | Payer: Self-pay

## 2018-08-21 HISTORY — PX: FOREIGN BODY REMOVAL ESOPHAGEAL: SHX5322

## 2018-08-21 SURGERY — REMOVAL, FOREIGN BODY, ESOPHAGUS
Anesthesia: General | Site: Neck

## 2018-08-21 MED ORDER — LIDOCAINE-EPINEPHRINE 1 %-1:100000 IJ SOLN
INTRAMUSCULAR | Status: AC
  Filled 2018-08-21: qty 1

## 2018-08-21 MED ORDER — FENTANYL CITRATE (PF) 250 MCG/5ML IJ SOLN
INTRAMUSCULAR | Status: DC | PRN
  Administered 2018-08-21 (×2): 50 ug via INTRAVENOUS
  Administered 2018-08-21: 150 ug via INTRAVENOUS

## 2018-08-21 MED ORDER — SUGAMMADEX SODIUM 200 MG/2ML IV SOLN
INTRAVENOUS | Status: DC | PRN
  Administered 2018-08-21 (×2): 200 mg via INTRAVENOUS

## 2018-08-21 MED ORDER — ROCURONIUM BROMIDE 10 MG/ML (PF) SYRINGE
PREFILLED_SYRINGE | INTRAVENOUS | Status: DC | PRN
  Administered 2018-08-21: 90 mg via INTRAVENOUS

## 2018-08-21 MED ORDER — MIDAZOLAM HCL 2 MG/2ML IJ SOLN
INTRAMUSCULAR | Status: AC
  Filled 2018-08-21: qty 2

## 2018-08-21 MED ORDER — 0.9 % SODIUM CHLORIDE (POUR BTL) OPTIME
TOPICAL | Status: DC | PRN
  Administered 2018-08-21: 1000 mL

## 2018-08-21 MED ORDER — LIDOCAINE 2% (20 MG/ML) 5 ML SYRINGE
INTRAMUSCULAR | Status: DC | PRN
  Administered 2018-08-21: 100 mg via INTRAVENOUS

## 2018-08-21 MED ORDER — LACTATED RINGERS IV SOLN
INTRAVENOUS | Status: DC
  Administered 2018-08-21 – 2018-08-24 (×2): via INTRAVENOUS

## 2018-08-21 MED ORDER — PROPOFOL 10 MG/ML IV BOLUS
INTRAVENOUS | Status: DC | PRN
  Administered 2018-08-21: 150 mg via INTRAVENOUS

## 2018-08-21 MED ORDER — PROPOFOL 10 MG/ML IV BOLUS
INTRAVENOUS | Status: AC
  Filled 2018-08-21: qty 20

## 2018-08-21 MED ORDER — HYDROMORPHONE HCL 1 MG/ML IJ SOLN
INTRAMUSCULAR | Status: AC
  Administered 2018-08-21: 0.5 mg via INTRAVENOUS
  Filled 2018-08-21: qty 1

## 2018-08-21 MED ORDER — ONDANSETRON HCL 4 MG/2ML IJ SOLN
4.0000 mg | Freq: Once | INTRAMUSCULAR | Status: AC | PRN
  Administered 2018-08-21: 4 mg via INTRAVENOUS

## 2018-08-21 MED ORDER — MIDAZOLAM HCL 5 MG/5ML IJ SOLN
INTRAMUSCULAR | Status: DC | PRN
  Administered 2018-08-21: 2 mg via INTRAVENOUS

## 2018-08-21 MED ORDER — HYDROMORPHONE HCL 1 MG/ML IJ SOLN
0.2500 mg | INTRAMUSCULAR | Status: DC | PRN
  Administered 2018-08-21 (×2): 0.5 mg via INTRAVENOUS

## 2018-08-21 MED ORDER — FENTANYL CITRATE (PF) 250 MCG/5ML IJ SOLN
INTRAMUSCULAR | Status: AC
  Filled 2018-08-21: qty 5

## 2018-08-21 MED ORDER — CLINDAMYCIN PHOSPHATE 900 MG/50ML IV SOLN
INTRAVENOUS | Status: AC
  Filled 2018-08-21: qty 50

## 2018-08-21 MED ORDER — MEPERIDINE HCL 50 MG/ML IJ SOLN: 6.2500 mg | INTRAMUSCULAR | Status: DC | PRN

## 2018-08-21 MED ORDER — ONDANSETRON HCL 4 MG/2ML IJ SOLN
INTRAMUSCULAR | Status: AC
  Administered 2018-08-21: 4 mg
  Filled 2018-08-21: qty 2

## 2018-08-21 MED ORDER — ACETAMINOPHEN 160 MG/5ML PO SOLN
1000.0000 mg | Freq: Four times a day (QID) | ORAL | Status: DC
  Administered 2018-08-21: 1000 mg via ORAL
  Filled 2018-08-21: qty 40.6

## 2018-08-21 MED ORDER — EPINEPHRINE PF 1 MG/10ML IJ SOSY
PREFILLED_SYRINGE | INTRAMUSCULAR | Status: AC
  Filled 2018-08-21: qty 10

## 2018-08-21 MED ORDER — CLINDAMYCIN PHOSPHATE 900 MG/50ML IV SOLN
900.0000 mg | Freq: Once | INTRAVENOUS | Status: AC
  Administered 2018-08-21: 900 mg via INTRAVENOUS

## 2018-08-21 SURGICAL SUPPLY — 24 items
BALLN PULM 15 16.5 18 X 75CM (BALLOONS)
BALLN PULM 15 16.5 18X75 (BALLOONS)
BALLOON PULM 15 16.5 18X75 (BALLOONS) IMPLANT
CANISTER SUCT 3000ML PPV (MISCELLANEOUS) ×3 IMPLANT
COVER BACK TABLE 60X90IN (DRAPES) ×3 IMPLANT
COVER WAND RF STERILE (DRAPES) ×3 IMPLANT
DRAPE HALF SHEET 40X57 (DRAPES) ×3 IMPLANT
GAUZE SPONGE 4X4 12PLY STRL (GAUZE/BANDAGES/DRESSINGS) IMPLANT
GLOVE ECLIPSE 7.5 STRL STRAW (GLOVE) ×3 IMPLANT
GUARD TEETH (MISCELLANEOUS) IMPLANT
KIT BASIN OR (CUSTOM PROCEDURE TRAY) ×3 IMPLANT
KIT TURNOVER KIT B (KITS) ×3 IMPLANT
NDL PRECISIONGLIDE 27X1.5 (NEEDLE) IMPLANT
NEEDLE PRECISIONGLIDE 27X1.5 (NEEDLE) IMPLANT
NS IRRIG 1000ML POUR BTL (IV SOLUTION) ×3 IMPLANT
PAD ARMBOARD 7.5X6 YLW CONV (MISCELLANEOUS) ×6 IMPLANT
PATTIES SURGICAL .5 X3 (DISPOSABLE) IMPLANT
SPECIMEN JAR SMALL (MISCELLANEOUS) IMPLANT
SYR CONTROL 10ML LL (SYRINGE) IMPLANT
SYR TB 1ML LUER SLIP (SYRINGE) IMPLANT
TOWEL OR 17X24 6PK STRL BLUE (TOWEL DISPOSABLE) ×3 IMPLANT
TUBE CONNECTING 12'X1/4 (SUCTIONS) ×1
TUBE CONNECTING 12X1/4 (SUCTIONS) ×2 IMPLANT
WATER STERILE IRR 1000ML POUR (IV SOLUTION) ×3 IMPLANT

## 2018-08-21 NOTE — Anesthesia Procedure Notes (Signed)
Procedure Name: Intubation Date/Time: 08/21/2018 11:58 AM Performed by: Jearld Pies, CRNA Pre-anesthesia Checklist: Patient identified, Emergency Drugs available, Suction available and Patient being monitored Patient Re-evaluated:Patient Re-evaluated prior to induction Oxygen Delivery Method: Circle System Utilized Preoxygenation: Pre-oxygenation with 100% oxygen Induction Type: IV induction Ventilation: Mask ventilation without difficulty Laryngoscope Size: Mac and 4 Grade View: Grade I Tube type: Oral Tube size: 7.5 mm Number of attempts: 1 Airway Equipment and Method: Stylet and Oral airway Placement Confirmation: ETT inserted through vocal cords under direct vision,  positive ETCO2 and breath sounds checked- equal and bilateral Secured at: 22 cm Tube secured with: Tape Dental Injury: Teeth and Oropharynx as per pre-operative assessment

## 2018-08-21 NOTE — Op Note (Signed)
OPERATIVE REPORT  DATE OF SURGERY: 08/21/2018  PATIENT:  David Mcconnell,  38 y.o. male  PRE-OPERATIVE DIAGNOSIS:  foreign body esophagus  POST-OPERATIVE DIAGNOSIS:  foreign body esophagus  PROCEDURE:  Procedure(s): REMOVAL FOREIGN BODY ESOPHAGEAL  SURGEON:  Susy Frizzle, MD  ASSISTANTS: None  ANESTHESIA:   General   EBL: 5 ml  DRAINS: None  LOCAL MEDICATIONS USED:  None  SPECIMEN:  none  COUNTS:  Correct  PROCEDURE DETAILS: The patient was taken to the operating room and placed on the operating table in the supine position. Following induction of general endotracheal anesthesia, the table was turned 90 degrees.  The head was draped in a standard fashion.  A maxillary tooth protector was used throughout the case.  A short cervical esophagoscope was entered into the oral cavity through the esophageal introitus and advanced down until the foreign object was identified.  It was smooth and round and irregularly-shaped and I was unable to grasp it with any of the foreign body forceps.  I was however able to push it and have it passed down the esophagus until I was unable to reach it with the short esophagoscope.  A 42 cm esophagoscope was then used and I was able to pass it into I believe and entered into the stomach.  I passed a soft esophageal dilator, 18 [place] all the way into the stomach.  It did not meet resistance.  Inspection of the esophageal wall revealed no obvious defect.  There may have been a mucosal tear but did not appear to be full-thickness.  At this point decision was made to not open the neck and to proceed with observation over the next 24 to 48 hours realizing that if he develops any neck swelling we will have to open his neck and drain it.  Recommend we keep him n.p.o. for 48 hours and then perform a swallow study to see if there is any evidence of leak.  If he develops any swelling of the neck prior to that then we will reevaluate and open up the neck and drain  it.    PATIENT DISPOSITION:  To PACU, stable

## 2018-08-21 NOTE — Anesthesia Preprocedure Evaluation (Signed)
Anesthesia Evaluation  Patient identified by MRN, date of birth, ID band Patient awake    Reviewed: Allergy & Precautions, NPO status , Patient's Chart, lab work & pertinent test results  Airway Mallampati: I  TM Distance: >3 FB Neck ROM: Full    Dental   Pulmonary Current Smoker,    Pulmonary exam normal        Cardiovascular Normal cardiovascular exam     Neuro/Psych Anxiety Depression    GI/Hepatic   Endo/Other    Renal/GU      Musculoskeletal   Abdominal   Peds  Hematology   Anesthesia Other Findings   Reproductive/Obstetrics                             Anesthesia Physical Anesthesia Plan  ASA: III  Anesthesia Plan: General   Post-op Pain Management:    Induction: Intravenous  PONV Risk Score and Plan: 1 and Ondansetron  Airway Management Planned: Oral ETT  Additional Equipment:   Intra-op Plan:   Post-operative Plan: Extubation in OR  Informed Consent: I have reviewed the patients History and Physical, chart, labs and discussed the procedure including the risks, benefits and alternatives for the proposed anesthesia with the patient or authorized representative who has indicated his/her understanding and acceptance.       Plan Discussed with: CRNA and Surgeon  Anesthesia Plan Comments:         Anesthesia Quick Evaluation

## 2018-08-21 NOTE — Transfer of Care (Signed)
Immediate Anesthesia Transfer of Care Note  Patient: David Mcconnell  Procedure(s) Performed: REMOVAL FOREIGN BODY ESOPHAGEAL (N/A Neck)  Patient Location: PACU  Anesthesia Type:General  Level of Consciousness: awake, alert  and oriented  Airway & Oxygen Therapy: Patient Spontanous Breathing and Patient connected to face mask oxygen  Post-op Assessment: Report given to RN and Post -op Vital signs reviewed and stable  Post vital signs: Reviewed and stable  Last Vitals:  Vitals Value Taken Time  BP 131/78 08/21/2018 12:46 PM  Temp    Pulse 54 08/21/2018 12:49 PM  Resp 13 08/21/2018 12:49 PM  SpO2 100 % 08/21/2018 12:49 PM  Vitals shown include unvalidated device data.  Last Pain:  Vitals:   08/21/18 0730  TempSrc: Oral  PainSc:    Orders per Michelle Piper MD to keep monitor applied to patient at this time, VSS. RN in PACU notified of patient having order for sitter.  Patients Stated Pain Goal: 0 (08/21/18 5053)  Complications: No apparent anesthesia complications

## 2018-08-21 NOTE — Anesthesia Postprocedure Evaluation (Signed)
Anesthesia Post Note  Patient: David Mcconnell  Procedure(s) Performed: REMOVAL FOREIGN BODY ESOPHAGEAL (N/A Neck)     Patient location during evaluation: PACU Anesthesia Type: General Level of consciousness: awake and alert Pain management: pain level controlled Vital Signs Assessment: post-procedure vital signs reviewed and stable Respiratory status: spontaneous breathing, nonlabored ventilation, respiratory function stable and patient connected to nasal cannula oxygen Cardiovascular status: blood pressure returned to baseline and stable Postop Assessment: no apparent nausea or vomiting Anesthetic complications: no    Last Vitals:  Vitals:   08/21/18 1358 08/21/18 1620  BP: (!) 150/79 140/86  Pulse: (!) 54 (!) 56  Resp: 16   Temp: 37.3 C   SpO2: 91%     Last Pain:  Vitals:   08/21/18 1358  TempSrc: Oral  PainSc:                  [name]

## 2018-08-21 NOTE — Progress Notes (Signed)
Patient ID: David Mcconnell, male   DOB: 07/07/1980, 38 y.o.   MRN: [id number]    2 Days Post-Op  Subjective: No new complaints.  Still with chest pain from lodged object in esophagus.  Passing flatus.  Objective: Vital signs in last 24 hours: Temp:  [98 F (36.7 C)-98.7 F (37.1 C)] 98.7 F (37.1 C) (03/04 0730) Pulse Rate:  [58-70] 58 (03/04 0730) Resp:  [13-17] 14 (03/04 0730) BP: (122-131)/(70-89) 125/75 (03/04 0730) SpO2:  [91 %-100 %] 100 % (03/04 0730) Last BM Date: 08/19/18  Intake/Output from previous day: 03/03 0701 - 03/04 0700 In: -  Out: 2450 [Urine:2450] Intake/Output this shift: No intake/output data recorded.  PE: Gen: NAD Heart: regular Lungs: CTAB Abd: soft, appropriately tender, +BS, ND, midline incision is stable, with some serous drainage on his bandage.  Staples in place.  Lab Results:  Recent Labs    08/20/18 0211  WBC 7.7  HGB 9.5*  HCT 31.4*  PLT 337   BMET Recent Labs    08/20/18 0211  NA 139  K 3.7  CL 101  CO2 26  GLUCOSE 99  BUN <5*  CREATININE 1.15  CALCIUM 9.0   PT/INR No results for input(s): LABPROT, INR in the last 72 hours. CMP     Component Value Date/Time   NA 139 08/20/2018 0211   K 3.7 08/20/2018 0211   CL 101 08/20/2018 0211   CO2 26 08/20/2018 0211   GLUCOSE 99 08/20/2018 0211   BUN <5 (L) 08/20/2018 0211   CREATININE 1.15 08/20/2018 0211   CALCIUM 9.0 08/20/2018 0211   PROT 6.0 (L) 08/15/2018 0514   ALBUMIN 3.0 (L) 08/15/2018 0514   AST 21 08/15/2018 0514   ALT 18 08/15/2018 0514   ALKPHOS 51 08/15/2018 0514   BILITOT 0.3 08/15/2018 0514   GFRNONAA >60 08/20/2018 0211   GFRAA >60 08/20/2018 0211   Lipase  No results found for: LIPASE     Studies/Results: Dg Neck Soft Tissue  Result Date: 08/20/2018 CLINICAL DATA:  Shortness of breath and sore throat EXAM: NECK SOFT TISSUES - 1+ VIEW COMPARISON:  None. FINDINGS: There is no evidence of retropharyngeal soft tissue swelling or epiglottic  enlargement. The cervical airway is unremarkable and no radio-opaque foreign body identified. IMPRESSION: No acute abnormality noted. Electronically Signed   By: Alcide Clever M.D.   On: 08/20/2018 10:13   Dg Chest 2 View  Result Date: 08/20/2018 CLINICAL DATA:  Sore throat and chest pain EXAM: CHEST - 2 VIEW COMPARISON:  08/11/2018 FINDINGS: Normal heart size and mediastinal contours. No acute infiltrate or edema. No effusion or pneumothorax. No acute osseous findings. IMPRESSION: Negative chest. Electronically Signed   By: Marnee Spring M.D.   On: 08/20/2018 10:09   Ct Soft Tissue Neck W Contrast  Result Date: 08/20/2018 CLINICAL DATA:  38 y/o  M; foreign body ingestion. EXAM: CT NECK WITH CONTRAST TECHNIQUE: Multidetector CT imaging of the neck was performed using the standard protocol following the bolus administration of intravenous contrast. CONTRAST:  73mL OMNIPAQUE IOHEXOL 300 MG/ML  SOLN COMPARISON:  None. FINDINGS: Pharynx and larynx: 13 mm tubular foreign body is perforating the wall of the proximal esophagus 5.4 cm below the level of the larynx (series 7, image 53). There is surrounding edema of soft tissues and mild pneumatosis extending into the upper mediastinum. No discrete rim enhancing collection identified at this time. Salivary glands: No inflammation, mass, or stone. Thyroid: Subcentimeter nodules within bilateral lobes of the thyroid.  Lymph nodes: None enlarged or abnormal density. Vascular: Negative. Limited intracranial: Negative. Visualized orbits: Negative. Mastoids and visualized paranasal sinuses: Clear. Skeleton: No acute or aggressive process. Upper chest: Negative. Other: None. IMPRESSION: 13 mm tubular foreign body is perforating the wall of the proximal esophagus 5.4 cm below the level of larynx. Surrounding edema of soft tissues and mild pneumatosis extending into the upper mediastinum. No discrete rim enhancing collection identified at this time. Electronically Signed   By:  Mitzi Hansen M.D.   On: 08/20/2018 15:31   Dg Abd 2 Views  Result Date: 08/19/2018 CLINICAL DATA:  Foreign body evaluation. EXAM: ABDOMEN - 2 VIEW COMPARISON:  08/18/2018 FINDINGS: Surgical staples over the midline of the mid abdomen. 6-7 mm linear metallic density over the mid abdomen right of midline unchanged compatible with foreign body. Second 6-7 mm radiopaque foreign body projects over the descending colon likely a small screw. Coil catheter unchanged over the left upper quadrant. Surgical suture line over the bowel in the right lower quadrant. No free peritoneal air. Several air-filled loops of large and small bowel. There is a central air-filled small bowel loop just left of midline slightly dilated measuring 4.6 cm which may be due to postoperative ileus. Remainder of the exam is unchanged. IMPRESSION: Air-filled large and small bowel loops with a single air-filled small bowel loop left of midline in the mid abdomen slightly dilated and likely due to postoperative ileus. Two small 6-7 mm foreign bodies as described. Coil catheter over the left upper quadrant unchanged. Electronically Signed   By: Elberta Fortis M.D.   On: 08/19/2018 09:51    Anti-infectives: Anti-infectives (From admission, onward)   Start     Dose/Rate Route Frequency Ordered Stop   08/14/18 1730  vancomycin (VANCOCIN) 2,000 mg in sodium chloride 0.9 % 500 mL IVPB     2,000 mg 250 mL/hr over 120 Minutes Intravenous STAT 08/14/18 1658 08/15/18 1730   08/14/18 1645  piperacillin-tazobactam (ZOSYN) IVPB 3.375 g     3.375 g 100 mL/hr over 30 Minutes Intravenous  Once 08/14/18 1640 08/14/18 1931   08/14/18 1645  vancomycin (VANCOCIN) 1,814 mg in sodium chloride 0.9 % 500 mL IVPB  Status:  Discontinued     20 mg/kg  90.7 kg 250 mL/hr over 120 Minutes Intravenous  Once 08/14/18 1640 08/14/18 1658       Assessment/Plan Self-inflicted bowel evisceration s/p ex lap with primary repair of bowel injury, Dr. Cliffton Asters  2/26- - wound with some drainage but no cellulitis Foreign body ingestion -pulse ox over a week ago, remains in stomach. Removal by GI in Endo at some point. - IV stopcock is lodged in the upper esophagus. Could not be removed in endo by GI nor in the OR by ENT yesterday. I consulted [facility]. Dr. Donata Clay recommends transfer to [facility], but they declined.  Dr. Pollyann Kennedy has assessed and will try and remove it today in OR. -all tele have been discontinued and he has minimal access to objects that can be swallowed. Psychiatric DO(borderline personality disorder) - -psychhas cleared patient.cont psych meds. FEN -NPO, increase IVF VTE -Lovenox Dispo - OR today with Dr. Pollyann Kennedy   LOS: 7 days    Letha Cape , [facility] 08/21/2018, 9:09 AM Pager: [phone]

## 2018-08-21 NOTE — Progress Notes (Signed)
Initial Nutrition Assessment  DOCUMENTATION CODES:   Not applicable  INTERVENTION:  Diet advancement as able 48 hours post surgery per MD and team.  RD to continue to monitor.   NUTRITION DIAGNOSIS:   Inadequate oral intake related to inability to eat as evidenced by NPO status.  GOAL:   Patient will meet greater than or equal to 90% of their needs  MONITOR:   Diet advancement, Labs, Skin, Weight trends, I & O's  REASON FOR ASSESSMENT:   NPO/Clear Liquid Diet    ASSESSMENT:   38 y.o. male with significant psychiatric hx - numerous prior events of self harm whom presented to ED today after opening a scar/blister on his midline abdominal wall, inserting a finger   Procedure (2/26): Ex lap with primary repair of bowel injury  Procedure (3/4): REMOVAL FOREIGN BODY ESOPHAGEAL IV stopcock lodged in the upper esophagus after intentional ingestion.   Pt unavailable, in OR, during time of visit. RD unable to obtain most recent nutrition history. Per MD note, will plan pt to continue NPO for 48 hours post surgery and observe for any neck swelling then swallow study will be done to evaluate for any evidence of leak. RD to order nutritional supplements once diet advances as appropriate.   Unable to complete Nutrition-Focused physical exam at this time.   Labs and medications reviewed.   Diet Order:   Diet Order            Diet NPO time specified  Diet effective now              EDUCATION NEEDS:   Not appropriate for education at this time  Skin:  Skin Assessment: Skin Integrity Issues: Skin Integrity Issues:: Incisions Incisions: abdomen, neck  Last BM:  3/2  Height:   Ht Readings from Last 1 Encounters:  08/21/18 6' 2.02" (1.88 m)    Weight:   Wt Readings from Last 1 Encounters:  08/21/18 90.7 kg    Ideal Body Weight:  86.4 kg  BMI:  Body mass index is 25.67 kg/m.  Estimated Nutritional Needs:   Kcal:  2200-2450  Protein:  110-120  grams  Fluid:  >/= 2.2 L/day    Roslyn Smiling, MS, RD, LDN Pager # [phone] After hours/ weekend pager # [phone]

## 2018-08-21 NOTE — Interval H&P Note (Signed)
History and Physical Interval Note:  08/21/2018 11:17 AM  David Mcconnell  has presented today for surgery, with the diagnosis of foreign body  The various methods of treatment have been discussed with the patient and family. After consideration of risks, benefits and other options for treatment, the patient has consented to  Procedure(s): REMOVAL FOREIGN BODY ESOPHAGEAL (N/A) as a surgical intervention .  The patient's history has been reviewed, patient examined, no change in status, stable for surgery.  I have reviewed the patient's chart and labs.  Questions were answered to the patient's satisfaction.     Upon reviewing the CT scan from late yesterday, it is apparent that the foreign body has penetrated the wall of the esophagus and part of it is in the periesophageal tissue in his neck.  There is some pneumomediastinum as well.  There is no evidence of obvious abscess.  Based on these findings, we are going to perform a neck exploration in order to remove the foreign body, in addition to the esophagoscopy.  Repair of the cervical esophagus may be necessary as well and placing a drain in the neck is going to be definitely necessary.  He understands all this and agrees.  We discussed risks of infection, additional injury to the esophagus, mediastinitis.   Serena Colonel

## 2018-08-22 ENCOUNTER — Encounter (HOSPITAL_COMMUNITY): Payer: Self-pay | Admitting: Otolaryngology

## 2018-08-22 LAB — BASIC METABOLIC PANEL
Anion gap: 10 (ref 5–15)
BUN: 6 mg/dL (ref 6–20)
CO2: 25 mmol/L (ref 22–32)
Calcium: 9.1 mg/dL (ref 8.9–10.3)
Chloride: 101 mmol/L (ref 98–111)
Creatinine, Ser: 1.01 mg/dL (ref 0.61–1.24)
GFR calc Af Amer: >60 mL/min (ref >60)
GFR calc non Af Amer: >60 mL/min (ref >60)
Glucose, Bld: 101 mg/dL — ABNORMAL HIGH (ref 70–99)
POTASSIUM: 3.8 mmol/L (ref 3.5–5.1)
Sodium: 136 mmol/L (ref 135–145)

## 2018-08-22 LAB — CBC
HCT: 32.8 % — ABNORMAL LOW (ref 39.0–52.0)
Hemoglobin: 9.7 g/dL — ABNORMAL LOW (ref 13.0–17.0)
MCH: 22.8 pg — AB (ref 26.0–34.0)
MCHC: 29.6 g/dL — ABNORMAL LOW (ref 30.0–36.0)
MCV: 77.2 fL — ABNORMAL LOW (ref 80.0–100.0)
Platelets: 361 10*3/uL (ref 150–400)
RBC: 4.25 MIL/uL (ref 4.22–5.81)
RDW: 15.1 % (ref 11.5–15.5)
WBC: 5.6 10*3/uL (ref 4.0–10.5)
nRBC: 0 % (ref 0.0–0.2)

## 2018-08-22 MED ORDER — CHLORHEXIDINE GLUCONATE CLOTH 2 % EX PADS
6.0000 | MEDICATED_PAD | Freq: Every day | CUTANEOUS | Status: DC
  Administered 2018-08-22 – 2018-08-25 (×4): 6 via TOPICAL

## 2018-08-22 MED ORDER — METHOCARBAMOL 1000 MG/10ML IJ SOLN
1000.0000 mg | Freq: Three times a day (TID) | INTRAVENOUS | Status: DC | PRN
  Administered 2018-08-22 (×2): 1000 mg via INTRAVENOUS
  Filled 2018-08-22 (×3): qty 10

## 2018-08-22 MED ORDER — LORAZEPAM 2 MG/ML IJ SOLN
1.0000 mg | Freq: Once | INTRAMUSCULAR | Status: AC
  Administered 2018-08-22: 1 mg via INTRAVENOUS
  Filled 2018-08-22: qty 1

## 2018-08-22 MED ORDER — HYDROMORPHONE HCL 1 MG/ML IJ SOLN
0.5000 mg | INTRAMUSCULAR | Status: DC | PRN
  Administered 2018-08-22 (×4): 1 mg via INTRAVENOUS
  Filled 2018-08-22 (×4): qty 1

## 2018-08-22 MED ORDER — HYDROMORPHONE HCL 1 MG/ML IJ SOLN
0.5000 mg | INTRAMUSCULAR | Status: DC | PRN
  Administered 2018-08-22 (×3): 1.5 mg via INTRAVENOUS
  Administered 2018-08-23: 1 mg via INTRAVENOUS
  Administered 2018-08-23: 1.5 mg via INTRAVENOUS
  Filled 2018-08-22 (×5): qty 2

## 2018-08-22 MED ORDER — DIPHENHYDRAMINE HCL 50 MG/ML IJ SOLN
25.0000 mg | Freq: Four times a day (QID) | INTRAMUSCULAR | Status: DC | PRN
  Administered 2018-08-22 – 2018-08-25 (×9): 25 mg via INTRAVENOUS
  Filled 2018-08-22 (×10): qty 1

## 2018-08-22 MED ORDER — SODIUM CHLORIDE 0.9 % IV SOLN
25.0000 mg | Freq: Once | INTRAVENOUS | Status: AC
  Administered 2018-08-22: 25 mg via INTRAVENOUS
  Filled 2018-08-22: qty 1

## 2018-08-22 MED ORDER — MUPIROCIN 2 % EX OINT
1.0000 "application " | TOPICAL_OINTMENT | Freq: Two times a day (BID) | CUTANEOUS | Status: DC
  Administered 2018-08-22 – 2018-08-25 (×7): 1 via NASAL
  Filled 2018-08-22: qty 88

## 2018-08-22 MED ORDER — PANTOPRAZOLE SODIUM 40 MG IV SOLR
40.0000 mg | Freq: Two times a day (BID) | INTRAVENOUS | Status: DC
  Administered 2018-08-22 – 2018-08-23 (×3): 40 mg via INTRAVENOUS
  Filled 2018-08-22 (×3): qty 40

## 2018-08-22 MED ORDER — LORAZEPAM 2 MG/ML IJ SOLN
2.0000 mg | Freq: Four times a day (QID) | INTRAMUSCULAR | Status: DC | PRN
  Administered 2018-08-22 – 2018-08-24 (×9): 2 mg via INTRAVENOUS
  Filled 2018-08-22 (×10): qty 1

## 2018-08-22 MED ORDER — KETOROLAC TROMETHAMINE 30 MG/ML IJ SOLN
30.0000 mg | Freq: Three times a day (TID) | INTRAMUSCULAR | Status: AC | PRN
  Administered 2018-08-22 – 2018-08-24 (×6): 30 mg via INTRAVENOUS
  Filled 2018-08-22 (×8): qty 1

## 2018-08-22 NOTE — Progress Notes (Signed)
Patient ID: David Mcconnell, male   DOB: 02/05/1981, 38 y.o.   MRN: [id number] Postop day 1  Vital signs stable, afebrile  Awake and alert, talking clearly.  No problems breathing.  Neck and throat feel better but his stomach is bothering him.  Neck is soft and free of any swelling or crepitance.  Stable postop.  Continue n.p.o.  Swallowing study tomorrow.

## 2018-08-22 NOTE — Progress Notes (Signed)
[facility] Progress Note  1 Day Post-Op  Subjective: CC: pain in stomach Pain in throat is better. More burning in stomach.  Having hiccups. Wants to eat.   Objective: Vital signs in last 24 hours: Temp:  [98.4 F (36.9 C)-99.1 F (37.3 C)] 98.4 F (36.9 C) (03/04 1953) Pulse Rate:  [54-78] 72 (03/05 0016) Resp:  [16-20] 20 (03/05 0016) BP: (114-158)/(70-86) 133/86 (03/05 0016) SpO2:  [91 %-100 %] 100 % (03/04 1953) Weight:  [90.7 kg] 90.7 kg (03/04 1105) Last BM Date: (PTA)  Intake/Output from previous day: 03/04 0701 - 03/05 0700 In: 240 [P.O.:240] Out: 550 [Urine:550] Intake/Output this shift: No intake/output data recorded.  PE: Gen:  Alert, NAD, pleasant Card:  Regular rate and rhythm Pulm:  Normal effort, clear to auscultation bilaterally Abd: Soft, mildly TTP, non-distended, bowel sounds present in all 4 quadrants, incision with staples present and small amount thick drainage Psych: A&Ox3   Lab Results:  Recent Labs    08/20/18 0211  WBC 7.7  HGB 9.5*  HCT 31.4*  PLT 337   BMET Recent Labs    08/20/18 0211  NA 139  K 3.7  CL 101  CO2 26  GLUCOSE 99  BUN <5*  CREATININE 1.15  CALCIUM 9.0   PT/INR No results for input(s): LABPROT, INR in the last 72 hours. CMP     Component Value Date/Time   NA 139 08/20/2018 0211   K 3.7 08/20/2018 0211   CL 101 08/20/2018 0211   CO2 26 08/20/2018 0211   GLUCOSE 99 08/20/2018 0211   BUN <5 (L) 08/20/2018 0211   CREATININE 1.15 08/20/2018 0211   CALCIUM 9.0 08/20/2018 0211   PROT 6.0 (L) 08/15/2018 0514   ALBUMIN 3.0 (L) 08/15/2018 0514   AST 21 08/15/2018 0514   ALT 18 08/15/2018 0514   ALKPHOS 51 08/15/2018 0514   BILITOT 0.3 08/15/2018 0514   GFRNONAA >60 08/20/2018 0211   GFRAA >60 08/20/2018 0211   Lipase  No results found for: LIPASE     Studies/Results: Dg Neck Soft Tissue  Result Date: 08/20/2018 CLINICAL DATA:  Shortness of breath and sore throat EXAM: NECK SOFT  TISSUES - 1+ VIEW COMPARISON:  None. FINDINGS: There is no evidence of retropharyngeal soft tissue swelling or epiglottic enlargement. The cervical airway is unremarkable and no radio-opaque foreign body identified. IMPRESSION: No acute abnormality noted. Electronically Signed   By: Alcide Clever M.D.   On: 08/20/2018 10:13   Dg Chest 2 View  Result Date: 08/20/2018 CLINICAL DATA:  Sore throat and chest pain EXAM: CHEST - 2 VIEW COMPARISON:  08/11/2018 FINDINGS: Normal heart size and mediastinal contours. No acute infiltrate or edema. No effusion or pneumothorax. No acute osseous findings. IMPRESSION: Negative chest. Electronically Signed   By: Marnee Spring M.D.   On: 08/20/2018 10:09   Ct Soft Tissue Neck W Contrast  Result Date: 08/20/2018 CLINICAL DATA:  38 y/o  M; foreign body ingestion. EXAM: CT NECK WITH CONTRAST TECHNIQUE: Multidetector CT imaging of the neck was performed using the standard protocol following the bolus administration of intravenous contrast. CONTRAST:  103mL OMNIPAQUE IOHEXOL 300 MG/ML  SOLN COMPARISON:  None. FINDINGS: Pharynx and larynx: 13 mm tubular foreign body is perforating the wall of the proximal esophagus 5.4 cm below the level of the larynx (series 7, image 53). There is surrounding edema of soft tissues and mild pneumatosis extending into the upper mediastinum. No discrete rim enhancing collection identified at this time. Salivary  glands: No inflammation, mass, or stone. Thyroid: Subcentimeter nodules within bilateral lobes of the thyroid. Lymph nodes: None enlarged or abnormal density. Vascular: Negative. Limited intracranial: Negative. Visualized orbits: Negative. Mastoids and visualized paranasal sinuses: Clear. Skeleton: No acute or aggressive process. Upper chest: Negative. Other: None. IMPRESSION: 13 mm tubular foreign body is perforating the wall of the proximal esophagus 5.4 cm below the level of larynx. Surrounding edema of soft tissues and mild pneumatosis  extending into the upper mediastinum. No discrete rim enhancing collection identified at this time. Electronically Signed   By: Mitzi Hansen M.D.   On: 08/20/2018 15:31    Anti-infectives: Anti-infectives (From admission, onward)   Start     Dose/Rate Route Frequency Ordered Stop   08/21/18 1130  clindamycin (CLEOCIN) IVPB 900 mg     900 mg 100 mL/hr over 30 Minutes Intravenous  Once 08/21/18 1116 08/21/18 1301   08/21/18 1117  clindamycin (CLEOCIN) 900 MG/50ML IVPB    Note to Pharmacy:  Aquilla Hacker   : cabinet override      08/21/18 1117 08/21/18 1201   08/14/18 1730  vancomycin (VANCOCIN) 2,000 mg in sodium chloride 0.9 % 500 mL IVPB     2,000 mg 250 mL/hr over 120 Minutes Intravenous STAT 08/14/18 1658 08/15/18 1730   08/14/18 1645  piperacillin-tazobactam (ZOSYN) IVPB 3.375 g     3.375 g 100 mL/hr over 30 Minutes Intravenous  Once 08/14/18 1640 08/14/18 1931   08/14/18 1645  vancomycin (VANCOCIN) 1,814 mg in sodium chloride 0.9 % 500 mL IVPB  Status:  Discontinued     20 mg/kg  90.7 kg 250 mL/hr over 120 Minutes Intravenous  Once 08/14/18 1640 08/14/18 1658       Assessment/Plan Self-inflicted bowel evisceration s/p ex lap with primary repair of bowel injury, Dr. Cliffton Asters 2/26- -wound with some drainage but no cellulitis Foreign body ingestion -pulse ox over a week ago, remains in stomach. Removal by GI in Endo at some point. (could be done as an outpatient) - IV stopcock was lodged - Dr. Pollyann Kennedy was able to dislodge stopcock and push it into stomach - protonix BID - UGI tomorrow -all tele have been discontinued and he has minimal access to objects that can be swallowed. Psychiatric DO(borderline personality disorder) - -psychhas cleared patient.cont psych meds.  FEN -NPO,  IVF VTE -Lovenox ID - clindamycin 3/4>>  Dispo- UGI tomorrow  LOS: 8 days    Wells Guiles , [facility] 08/22/2018, 8:57 AM Pager: [phone]

## 2018-08-23 ENCOUNTER — Inpatient Hospital Stay (HOSPITAL_COMMUNITY)

## 2018-08-23 MED ORDER — HYDROMORPHONE HCL 1 MG/ML IJ SOLN
0.5000 mg | INTRAMUSCULAR | Status: DC | PRN
  Administered 2018-08-23 – 2018-08-25 (×8): 0.5 mg via INTRAVENOUS
  Filled 2018-08-23 (×8): qty 1

## 2018-08-23 MED ORDER — IOHEXOL 300 MG/ML  SOLN
150.0000 mL | Freq: Once | INTRAMUSCULAR | Status: AC | PRN
  Administered 2018-08-23: 300 mL via ORAL

## 2018-08-23 MED ORDER — LURASIDONE HCL 20 MG PO TABS
20.0000 mg | ORAL_TABLET | Freq: Every day | ORAL | Status: DC
  Administered 2018-08-24 – 2018-08-25 (×2): 20 mg via ORAL
  Filled 2018-08-23 (×2): qty 1

## 2018-08-23 MED ORDER — ACETAMINOPHEN 160 MG/5ML PO SOLN: 500.0000 mg | Freq: Four times a day (QID) | ORAL | Status: DC | PRN

## 2018-08-23 MED ORDER — BOOST / RESOURCE BREEZE PO LIQD CUSTOM
1.0000 | Freq: Three times a day (TID) | ORAL | Status: DC
  Administered 2018-08-23 – 2018-08-25 (×3): 1 via ORAL

## 2018-08-23 MED ORDER — BUPROPION HCL ER (XL) 150 MG PO TB24
150.0000 mg | ORAL_TABLET | Freq: Every day | ORAL | Status: DC
  Administered 2018-08-23 – 2018-08-25 (×3): 150 mg via ORAL
  Filled 2018-08-23 (×3): qty 1

## 2018-08-23 MED ORDER — OXYCODONE HCL 5 MG PO TABS
5.0000 mg | ORAL_TABLET | ORAL | Status: DC | PRN
  Administered 2018-08-23 – 2018-08-25 (×8): 10 mg via ORAL
  Filled 2018-08-23 (×8): qty 2

## 2018-08-23 MED ORDER — LAMOTRIGINE 25 MG PO TABS
25.0000 mg | ORAL_TABLET | Freq: Two times a day (BID) | ORAL | Status: DC
  Administered 2018-08-23 – 2018-08-25 (×5): 25 mg via ORAL
  Filled 2018-08-23 (×5): qty 1

## 2018-08-23 MED ORDER — METHOCARBAMOL 500 MG PO TABS
1000.0000 mg | ORAL_TABLET | Freq: Three times a day (TID) | ORAL | Status: DC | PRN
  Administered 2018-08-23 – 2018-08-24 (×3): 1000 mg via ORAL
  Filled 2018-08-23 (×3): qty 2

## 2018-08-23 MED ORDER — PANTOPRAZOLE SODIUM 40 MG PO TBEC
40.0000 mg | DELAYED_RELEASE_TABLET | Freq: Two times a day (BID) | ORAL | Status: DC
  Administered 2018-08-23 – 2018-08-25 (×4): 40 mg via ORAL
  Filled 2018-08-23 (×4): qty 1

## 2018-08-23 NOTE — Progress Notes (Signed)
Nutrition Follow-up  DOCUMENTATION CODES:   Not applicable  INTERVENTION:  Provide Boost Breeze po TID, each supplement provides 250 kcal and 9 grams of protein.  Encourage adequate PO intake.   NUTRITION DIAGNOSIS:   Inadequate oral intake related to inability to eat as evidenced by NPO status; diet advanced; improving  GOAL:   Patient will meet greater than or equal to 90% of their needs; progressing  MONITOR:   Diet advancement, Labs, Skin, Weight trends, I & O's  REASON FOR ASSESSMENT:   NPO/Clear Liquid Diet    ASSESSMENT:   38 y.o. male with significant psychiatric hx - numerous prior events of self harm whom presented to ED today after opening a scar/blister on his midline abdominal wall, inserting a finger  Procedure (2/26): Ex lap with primary repair of bowel injury  Procedure (3/4): REMOVAL FOREIGN BODY ESOPHAGEAL IV stopcock lodged in the upper esophagus after intentional ingestion. MD able to dislodge stopcock and push it into stomach  Pt currently on a clear liquid diet. Pt reports abdominal discomfort. Medication has been administered. RD to order nutritional supplements to aid in caloric and protein needs. Labs and medications reviewed.   Diet Order:   Diet Order            Diet clear liquid Room service appropriate? Yes; Fluid consistency: Thin  Diet effective now              EDUCATION NEEDS:   Not appropriate for education at this time  Skin:  Skin Assessment: Skin Integrity Issues: Skin Integrity Issues:: Incisions Incisions: abdomen, neck  Last BM:  3/2  Height:   Ht Readings from Last 1 Encounters:  08/21/18 6' 2.02" (1.88 m)    Weight:   Wt Readings from Last 1 Encounters:  08/21/18 90.7 kg    Ideal Body Weight:  86.4 kg  BMI:  Body mass index is 25.67 kg/m.  Estimated Nutritional Needs:   Kcal:  2200-2450  Protein:  110-120 grams  Fluid:  >/= 2.2 L/day    Roslyn Smiling, MS, RD, LDN Pager #  [phone] After hours/ weekend pager # [phone]

## 2018-08-23 NOTE — Progress Notes (Signed)
Called MD about pt with emesis after starting soft foods today. Pt requested RN to call MD to see if he could have a one time dose of Dilaudid. New order to change diet order to clear liquids. Will continue to monitor.

## 2018-08-23 NOTE — Progress Notes (Signed)
Case discussed with Dr. Dulce Sellar, who had discussed the case with the surgical team.  Today's GI series (reviewed) confirms that the stopcock foreign body is in the esophagus, and was not mobile.  He still has the pulse oximeter wires in the stomach.  It is Dr. Hulen Shouts opinion, and mine, that in all probability the esophageal foreign body never entered the stomach during his recent ENT procedure, but rather, was advanced into the more distal section of the esophagus.  I do not think that this foreign body could have gotten refluxed or regurgitated from the stomach up into the esophagus.  Based on prior endoscopic experience trying to remove this foreign body from the esophagus, it seems unlikely that it will be graspable and removable endoscopically.  However, if it has not become embedded in the esophageal mucosa, it might be possible for [place] to endoscopically gently advance it into the stomach, where its position would be less of a threat to the patient.  Depending on its size and configuration, it might even be possible, within the space provided by the gastric lumen, to grasp and reorient the foreign body so that it can be withdrawn out the esophagus, although I think that is rather doubtful.  This was discussed with the patient this evening.  I offered my recommendation that we attempt endoscopic advancement of his foreign body from the esophagus into the stomach.  Although this will involve some risk of lacerating or perhaps even perforating the esophagus, I think that risk is less than leaving the foreign body in its current position, to say nothing of the fact that he would not likely be able to eat solid food, such as meat, with the foreign body in its current position.    The patient asked if he could have the procedure done with him awake and I told him that was not feasible both for comfort and more especially for safety reasons, because of the likelihood of a strong gag or retch reflex in this  young, muscular male, which would make the procedure technically difficult and more hazardous.  Other risks of endoscopy, such as bleeding and anesthesia, have been previously discussed with the patient.  We will attempt to get this scheduled for tomorrow.  Florencia Reasons, M.D. Pager [phone] If no answer or after 5 PM call [phone]

## 2018-08-23 NOTE — Progress Notes (Addendum)
[facility] Progress Note  2 Days Post-Op  Subjective: CC: abdominal pain Patient reports he feels like ingested foreign bodies are rubbing against stomach. Protonix helped some with burning and nausea. Passing flatus. Wants to eat.   Objective: Vital signs in last 24 hours: Temp:  [98.1 F (36.7 C)-98.7 F (37.1 C)] 98.7 F (37.1 C) (03/06 0753) Pulse Rate:  [59-67] 67 (03/06 0753) Resp:  [11-20] 20 (03/06 0753) BP: (101-116)/(55-70) 116/70 (03/06 0753) SpO2:  [100 %] 100 % (03/06 0753) Last BM Date: (PTA)  Intake/Output from previous day: 03/05 0701 - 03/06 0700 In: 75 [IV Piggyback:75] Out: 650 [Urine:650] Intake/Output this shift: No intake/output data recorded.  PE: Gen:  Alert, NAD, pleasant Card:  Regular rate and rhythm Pulm:  Normal effort, clear to auscultation bilaterally Abd: Soft, mildly TTP, non-distended, bowel sounds present in all 4 quadrants, incision with staples present and small amount thick drainage Psych: A&Ox3   Lab Results:  Recent Labs    08/22/18 0948  WBC 5.6  HGB 9.7*  HCT 32.8*  PLT 361   BMET Recent Labs    08/22/18 0948  NA 136  K 3.8  CL 101  CO2 25  GLUCOSE 101*  BUN 6  CREATININE 1.01  CALCIUM 9.1   PT/INR No results for input(s): LABPROT, INR in the last 72 hours. CMP     Component Value Date/Time   NA 136 08/22/2018 0948   K 3.8 08/22/2018 0948   CL 101 08/22/2018 0948   CO2 25 08/22/2018 0948   GLUCOSE 101 (H) 08/22/2018 0948   BUN 6 08/22/2018 0948   CREATININE 1.01 08/22/2018 0948   CALCIUM 9.1 08/22/2018 0948   PROT 6.0 (L) 08/15/2018 0514   ALBUMIN 3.0 (L) 08/15/2018 0514   AST 21 08/15/2018 0514   ALT 18 08/15/2018 0514   ALKPHOS 51 08/15/2018 0514   BILITOT 0.3 08/15/2018 0514   GFRNONAA >60 08/22/2018 0948   GFRAA >60 08/22/2018 0948   Lipase  No results found for: LIPASE     Studies/Results: No results found.  Anti-infectives: Anti-infectives (From admission, onward)   Start     Dose/Rate Route Frequency Ordered Stop   08/21/18 1130  clindamycin (CLEOCIN) IVPB 900 mg     900 mg 100 mL/hr over 30 Minutes Intravenous  Once 08/21/18 1116 08/21/18 1301   08/21/18 1117  clindamycin (CLEOCIN) 900 MG/50ML IVPB    Note to Pharmacy:  Aquilla Hacker   : cabinet override      08/21/18 1117 08/21/18 1201   08/14/18 1730  vancomycin (VANCOCIN) 2,000 mg in sodium chloride 0.9 % 500 mL IVPB     2,000 mg 250 mL/hr over 120 Minutes Intravenous STAT 08/14/18 1658 08/15/18 1730   08/14/18 1645  piperacillin-tazobactam (ZOSYN) IVPB 3.375 g     3.375 g 100 mL/hr over 30 Minutes Intravenous  Once 08/14/18 1640 08/14/18 1931   08/14/18 1645  vancomycin (VANCOCIN) 1,814 mg in sodium chloride 0.9 % 500 mL IVPB  Status:  Discontinued     20 mg/kg  90.7 kg 250 mL/hr over 120 Minutes Intravenous  Once 08/14/18 1640 08/14/18 1658       Assessment/Plan Self-inflicted bowel evisceration s/p ex lap with primary repair of bowel injury, Dr. Cliffton Asters 2/26- -wound with some drainage but no cellulitis Foreign body ingestion -pulse ox over a week ago, remains in stomach. Removal by GI in Endo at some point. (could be done as an outpatient) - IV stopcock was lodged -  Dr. Pollyann Kennedy was able to dislodge stopcock and push it into stomach - protonix BID - UGI negative for leak - soft diet -all tele have been discontinued and he has minimal access to objects that can be swallowed. Psychiatric DO(borderline personality disorder) - -psychhas cleared patient.cont psych meds.  FEN -soft diet VTE -Lovenox ID - clindamycin 3/4>>  Dispo-soft diet, possible discharge later today   LOS: 9 days    Wells Guiles , [facility] 08/23/2018, 9:05 AM Pager: [phone]

## 2018-08-24 ENCOUNTER — Inpatient Hospital Stay (HOSPITAL_COMMUNITY): Admitting: Anesthesiology

## 2018-08-24 ENCOUNTER — Encounter (HOSPITAL_COMMUNITY): Admission: EM | Disposition: A | Discharge status: Home / Self Care | Payer: Self-pay

## 2018-08-24 ENCOUNTER — Encounter (HOSPITAL_COMMUNITY): Payer: Self-pay

## 2018-08-24 HISTORY — PX: ESOPHAGOGASTRODUODENOSCOPY (EGD) WITH PROPOFOL: SHX5813

## 2018-08-24 HISTORY — PX: FOREIGN BODY REMOVAL ESOPHAGEAL: SHX5322

## 2018-08-24 SURGERY — ESOPHAGOGASTRODUODENOSCOPY (EGD) WITH PROPOFOL
Anesthesia: General

## 2018-08-24 MED ORDER — LIDOCAINE 2% (20 MG/ML) 5 ML SYRINGE
INTRAMUSCULAR | Status: DC | PRN
  Administered 2018-08-24: 60 mg via INTRAVENOUS

## 2018-08-24 MED ORDER — MIDAZOLAM HCL 5 MG/5ML IJ SOLN
INTRAMUSCULAR | Status: DC | PRN
  Administered 2018-08-24: 2 mg via INTRAVENOUS

## 2018-08-24 MED ORDER — PHENYLEPHRINE 40 MCG/ML (10ML) SYRINGE FOR IV PUSH (FOR BLOOD PRESSURE SUPPORT)
PREFILLED_SYRINGE | INTRAVENOUS | Status: DC | PRN
  Administered 2018-08-24: 120 ug via INTRAVENOUS
  Administered 2018-08-24 (×2): 80 ug via INTRAVENOUS

## 2018-08-24 MED ORDER — PROPOFOL 10 MG/ML IV BOLUS
INTRAVENOUS | Status: DC | PRN
  Administered 2018-08-24: 180 mg via INTRAVENOUS

## 2018-08-24 MED ORDER — ONDANSETRON HCL 4 MG/2ML IJ SOLN
INTRAMUSCULAR | Status: DC | PRN
  Administered 2018-08-24: 4 mg via INTRAVENOUS

## 2018-08-24 MED ORDER — LACTATED RINGERS IV SOLN: INTRAVENOUS | Status: DC

## 2018-08-24 MED ORDER — SODIUM CHLORIDE 0.9 % IV SOLN: INTRAVENOUS | Status: DC

## 2018-08-24 MED ORDER — LORAZEPAM 2 MG/ML IJ SOLN
1.0000 mg | INTRAMUSCULAR | Status: DC | PRN
  Administered 2018-08-24 – 2018-08-25 (×6): 1 mg via INTRAVENOUS
  Filled 2018-08-24 (×5): qty 1

## 2018-08-24 MED ORDER — SUCCINYLCHOLINE CHLORIDE 200 MG/10ML IV SOSY
PREFILLED_SYRINGE | INTRAVENOUS | Status: DC | PRN
  Administered 2018-08-24: 80 mg via INTRAVENOUS

## 2018-08-24 MED ORDER — FENTANYL CITRATE (PF) 100 MCG/2ML IJ SOLN
INTRAMUSCULAR | Status: DC | PRN
  Administered 2018-08-24: 100 ug via INTRAVENOUS

## 2018-08-24 MED ORDER — SODIUM CHLORIDE 0.9% FLUSH: 10.0000 mL | INTRAVENOUS | Status: DC | PRN

## 2018-08-24 SURGICAL SUPPLY — 15 items

## 2018-08-24 NOTE — Progress Notes (Signed)
I went by the patient's room to review the results of his endoscopy from today.  The patient seems to have a good understanding.  He has some sore throat and abdominal pain but is in no distress.  The neck has no crepitance, the abdomen is soft and nontender, and bowel sounds are present.  Impression: Benign postprocedural course.  Plastic stopcock remains in stomach as of time of completion of today's endoscopy.  Plan: Please see my progress note from earlier today.  We will sign off, but call if we can be of further assistance with this patient.  Florencia Reasons, M.D. Pager [phone] If no answer or after 5 PM call [phone]

## 2018-08-24 NOTE — Anesthesia Preprocedure Evaluation (Signed)
Anesthesia Evaluation  Patient identified by MRN, date of birth, ID band Patient awake    Reviewed: Allergy & Precautions, NPO status , Patient's Chart, lab work & pertinent test results  History of Anesthesia Complications Negative for: history of anesthetic complications  Airway Mallampati: I  TM Distance: >3 FB Neck ROM: Full    Dental  (+) Dental Advisory Given   Pulmonary Current Smoker,    breath sounds clear to auscultation       Cardiovascular Normal cardiovascular exam Rhythm:Regular     Neuro/Psych PSYCHIATRIC DISORDERS Anxiety Depression Ingestion of foreign body Self-injurious behavior     GI/Hepatic (+)     substance abuse  cocaine use,   Endo/Other    Renal/GU      Musculoskeletal   Abdominal   Peds  Hematology   Anesthesia Other Findings   Reproductive/Obstetrics                             Anesthesia Physical Anesthesia Plan  ASA: III  Anesthesia Plan: General   Post-op Pain Management:    Induction: Intravenous  PONV Risk Score and Plan: 1 and Treatment may vary due to age or medical condition and Ondansetron  Airway Management Planned: Oral ETT  Additional Equipment: None  Intra-op Plan:   Post-operative Plan: Extubation in OR  Informed Consent: I have reviewed the patients History and Physical, chart, labs and discussed the procedure including the risks, benefits and alternatives for the proposed anesthesia with the patient or authorized representative who has indicated his/her understanding and acceptance.     Dental advisory given  Plan Discussed with: CRNA and Surgeon  Anesthesia Plan Comments:         Anesthesia Quick Evaluation

## 2018-08-24 NOTE — Interval H&P Note (Signed)
History and Physical Interval Note:  08/24/2018 12:02 PM  David Mcconnell  has presented today for surgery, with the diagnosis of esophageal foreign body.  The various methods of treatment have been discussed with the patient. After consideration of risks, benefits and other options for treatment, the patient has consented to  Procedure(s): ESOPHAGOGASTRODUODENOSCOPY (EGD) WITH PROPOFOL (N/A) as a surgical intervention.  The patient's history has been reviewed, patient examined, no change in status, stable for surgery.  I have reviewed the patient's chart and labs.  Questions were answered to the patient's satisfaction.     Katy Fitch [name]

## 2018-08-24 NOTE — Op Note (Signed)
[facility] Patient Name: David Mcconnell Procedure Date : 08/24/2018 MRN: [id number] Attending MD: Ronald Lobo , MD Date of Birth: [birth year] CSN: [id number] Age: 38 Admit Type: Inpatient Procedure:                Upper GI endoscopy Indications:              Foreign bodies in the stomach Providers:                Ronald Lobo, MD, Burtis Junes, RN, Laverda Sorenson,                            Technician, Dewitt Hoes, CRNA Referring MD:              Medicines:                General Anesthesia Complications:            No immediate complications. Estimated Blood Loss:     Estimated blood loss: none. Procedure:                Pre-Anesthesia Assessment:                           - Prior to the procedure, a History and Physical                            was performed, and patient medications and                            allergies were reviewed. The patient's tolerance of                            previous anesthesia was also reviewed. The risks                            and benefits of the procedure and the sedation                            options and risks were discussed with the patient.                            All questions were answered, and informed consent                            was obtained. Prior Anticoagulants: The patient has                            taken no previous anticoagulant or antiplatelet                            agents. ASA Grade Assessment: III - A patient with                            severe systemic disease. After reviewing the risks  and benefits, the patient was deemed in                            satisfactory condition to undergo the procedure.                           After obtaining informed consent, the endoscope was                            passed under direct vision. Throughout the                            procedure, the patient's blood pressure, pulse, and                            oxygen  saturations were monitored continuously. The                            GIF-H190 ([id number]) Olympus gastroscope was                            introduced through the mouth, and advanced to the                            second part of duodenum. The upper GI endoscopy was                            technically difficult and complex due to presence                            of foreign body. The patient tolerated the                            procedure well. Scope In: Scope Out: Findings:      Focal erosive esophagitis was found, presumably healing mucosa from       recent trauma from the presence of a foreign body and the previous       attempts to remove it.      The exam of the esophagus was otherwise normal.      By the time of this exam, the plastic 3-way stopcock, which had been       radiographically confirmed to be stationary in the distal esophagus on       an UGI series yesterday, had spontaneously migrated into the stomach.      In addition to the plastic 3-way stopcock, a pulse oximeter wire/plug       were found in the gastric fundus.      Removal of the latter was readily accomplished with a Raptor grasping       device.      Unfortunately, prolonged efforts (approximately 1 hr) to remove the       stopcock were unsuccessful. We tried using a snare and the [employer], and repositioning the item into various orientations.       In each case, we could advance the item up the esophagus without much  resistance or any significant trauma to the esophageal lining, but when       we reached the cervical esophagus just below the cricopharyngeus, it met       firm resistance which could not be overcome even by pulling firmly. We       tried neck flexion and transient deflation of the ET tube balloon, to no       avail. Ultimately, it was felt that further attempts would be       unsuccessful so the procedure was terminated, after replacing the       foreign  body back into the stomach.      The exam of the stomach was otherwise normal.      The cardia and gastric fundus were normal on retroflexion.      The examined duodenum was normal.      Careful examination of the esophagus at the conclusion of the procedure       disclosed no evidence of lacerations, perforation, or other significant       injury from today's procedure. Impression:               - Focal erosive esophagitis, probably resolving                            mucosal abrasions/trauma.                           - A plastic 3-way stopcock and a pulse oximeter                            wire/plug were found in the stomach. Removal of the                            pulse oximeter wire/plug was successful but the                            stopcock could not be extracted despite prolonged                            efforts as described above.                           - Normal examined duodenum. Recommendation:           - Observe patient's clinical course following                            today's procedure with therapeutic intervention.                            Case discussed with Dr. Brantley Stage of Surgery. Will                            allow liquid diet for now, could advance as                            tolerated. Procedure Code(s):        --- Professional ---  [phone], Esophagogastroduodenoscopy, flexible,                            transoral; with removal of foreign body(s) Diagnosis Code(s):        --- Professional ---                           G68.8TLZ, Foreign body in stomach, initial encounter CPT copyright 2018 [employer]. All rights reserved. The codes documented in this report are preliminary and upon coder review may  be revised to meet current compliance requirements. Ronald Lobo, MD 08/24/2018 2:03:47 PM This report has been signed electronically. Number of Addenda: 0

## 2018-08-24 NOTE — Anesthesia Postprocedure Evaluation (Signed)
Anesthesia Post Note  Patient: David Mcconnell  Procedure(s) Performed: ESOPHAGOGASTRODUODENOSCOPY (EGD) WITH PROPOFOL (N/A ) REMOVAL FOREIGN BODY ESOPHAGEAL     Patient location during evaluation: Endoscopy Anesthesia Type: General Level of consciousness: awake and patient cooperative Pain management: pain level controlled Vital Signs Assessment: post-procedure vital signs reviewed and stable Respiratory status: spontaneous breathing, nonlabored ventilation, respiratory function stable and patient connected to nasal cannula oxygen Cardiovascular status: blood pressure returned to baseline and stable Postop Assessment: no apparent nausea or vomiting Anesthetic complications: no    Last Vitals:  Vitals:   08/24/18 1502 08/24/18 1703  BP: (!) 188/73 117/63  Pulse: (!) 51 (!) 56  Resp: 16 18  Temp: 36.7 C 36.7 C  SpO2: 100% 100%    Last Pain:  Vitals:   08/24/18 1757  TempSrc:   PainSc: 8                  Alessia Gonsalez

## 2018-08-24 NOTE — Plan of Care (Signed)
  Problem: Education: Goal: Knowledge of warning signs, risks, and behaviors that relate to suicide ideation and self-harm behaviors will improve Outcome: Progressing   Problem: Health Behavior/Discharge (Transition) Planning: Goal: Ability to manage health-related needs will improve Outcome: Progressing   

## 2018-08-24 NOTE — Progress Notes (Signed)
3 Days Post-Op   Subjective/Chief Complaint: Complains of pain in stomach no further nausea    Objective: Vital signs in last 24 hours: Temp:  [98.2 F (36.8 C)-98.6 F (37 C)] 98.2 F (36.8 C) (03/07 0340) Pulse Rate:  [61-79] 61 (03/07 0340) Resp:  [18] 18 (03/07 0340) BP: (119-121)/(67-74) 119/67 (03/07 0340) SpO2:  [99 %-100 %] 99 % (03/07 0340) Last BM Date: (PTA)  Intake/Output from previous day: 03/06 0701 - 03/07 0700 In: 1729.5 [P.O.:240; I.V.:1489.5] Out: 2500 [Urine:2500] Intake/Output this shift: No intake/output data recorded.  General appearance: alert and cooperative Incision/Wound:staples in place dry no peritonitis   Lab Results:  Recent Labs    08/22/18 0948  WBC 5.6  HGB 9.7*  HCT 32.8*  PLT 361   BMET Recent Labs    08/22/18 0948  NA 136  K 3.8  CL 101  CO2 25  GLUCOSE 101*  BUN 6  CREATININE 1.01  CALCIUM 9.1   PT/INR No results for input(s): LABPROT, INR in the last 72 hours. ABG No results for input(s): PHART, HCO3 in the last 72 hours.  Invalid input(s): PCO2, PO2  Studies/Results: Dg Ugi W Single Cm (sol Or Thin Ba)  Result Date: 08/23/2018 CLINICAL DATA:  Post upper endoscopy for removal of foreign bodies. Concern for esophageal rupture. EXAM: WATER SOLUBLE UPPER GI SERIES TECHNIQUE: Single-column upper GI series was performed using water soluble contrast. CONTRAST:  OMNIPAQUE IOHEXOL 300 MG/ML  SOLN COMPARISON:  Neck CT 08/20/2018 FLUOROSCOPY TIME:  Fluoroscopy Time:  3 minutes Radiation Exposure Index (if provided by the fluoroscopic device): 33.2 mGy Number of Acquired Spot Images: 4 FINDINGS: The hypopharynx demonstrated no filling defects and was normal in contour. The esophagus was smooth in contour and demonstrated normal motility. There was no narrowing or stricture. There are however 2 or 3 radiolucent foreign bodies within the mid to distal esophagus, approximately 14 cm distal to the thoracic inlet. The foreign  bodies measure between 11 and 16 mm and demonstrate no change in position after swallowing contrast. The contrast is able to pass through the affected part of the esophagus, which appears slightly dilated. There was no hiatal hernia. Mild spontaneous gastroesophageal reflux. IMPRESSION: There are 2 or 3 radiolucent foreign bodies within the mid to distal esophagus, approximately 14 cm distal to the thoracic inlet. The contrast is able to pass through the affected part of the esophagus, which appears slightly dilated. The foreign bodies did not change position upon swallowing of contrast. No extraluminal extravasation was documented. Images of the stomach demonstrate the known foreign body (pulse oximeter) to be coiled within the gastric body. Contrast freely passes past the foreign body into the duodenum. The mucosal pattern of the stomach and duodenum are grossly normal. No extraluminal extravasation was noted. Electronically Signed   By: Ted Mcalpine M.D.   On: 08/23/2018 09:19    Anti-infectives: Anti-infectives (From admission, onward)   Start     Dose/Rate Route Frequency Ordered Stop   08/21/18 1130  clindamycin (CLEOCIN) IVPB 900 mg     900 mg 100 mL/hr over 30 Minutes Intravenous  Once 08/21/18 1116 08/21/18 1301   08/21/18 1117  clindamycin (CLEOCIN) 900 MG/50ML IVPB    Note to Pharmacy:  Aquilla Hacker   : cabinet override      08/21/18 1117 08/21/18 1201   08/14/18 1730  vancomycin (VANCOCIN) 2,000 mg in sodium chloride 0.9 % 500 mL IVPB     2,000 mg 250 mL/hr over 120  Minutes Intravenous STAT 08/14/18 1658 08/15/18 1730   08/14/18 1645  piperacillin-tazobactam (ZOSYN) IVPB 3.375 g     3.375 g 100 mL/hr over 30 Minutes Intravenous  Once 08/14/18 1640 08/14/18 1931   08/14/18 1645  vancomycin (VANCOCIN) 1,814 mg in sodium chloride 0.9 % 500 mL IVPB  Status:  Discontinued     20 mg/kg  90.7 kg 250 mL/hr over 120 Minutes Intravenous  Once 08/14/18 1640 08/14/18 1658       Assessment/Plan: Self-inflicted bowel evisceration s/p ex lap with primary repair of bowel injury, Dr. Cliffton Asters 2/26- -wound with some drainage but no cellulitis Foreign body ingestion -pulse ox over a week ago, remains in stomach. Removal by GI in Endo at some point.(could be done as an outpatient) - IV stopcockwas lodged - Dr. Pollyann Kennedy was able to dislodge stopcock and push it into stomach It has refluxed into the esophagus and he needs endoscopy  GI following  - protonix BID - UGI negative for leak - soft diet -all tele have been discontinued and he has minimal access to objects that can be swallowed. Psychiatric DO(borderline personality disorder) - -psychhas cleared patient.cont psych meds.  FEN -clear  VTE -Lovenox ID- clindamycin 3/4>>  Dispo   LOS: 10 days    David Fus A Maddoxx Mcconnell 08/24/2018

## 2018-08-24 NOTE — Progress Notes (Signed)
Patient ID: David Mcconnell, male   DOB: 1981-06-13, 38 y.o.   MRN: [id number] Upper GI reviewed.  Foreign-body now in distal esophagus.  No evidence of a leak.  Agree with advancing diet.  Follow-up with GI and/or general surgery for future intervention on this foreign body.  Call if needed.

## 2018-08-24 NOTE — Anesthesia Procedure Notes (Signed)
Procedure Name: Intubation Date/Time: 08/24/2018 12:19 PM Performed by: Trinna Post., CRNA Pre-anesthesia Checklist: Patient identified, Emergency Drugs available, Suction available, Patient being monitored and Timeout performed Patient Re-evaluated:Patient Re-evaluated prior to induction Oxygen Delivery Method: Circle system utilized Preoxygenation: Pre-oxygenation with 100% oxygen Induction Type: IV induction Ventilation: Mask ventilation without difficulty Laryngoscope Size: Mac and 4 Grade View: Grade I Tube type: Oral Tube size: 7.5 mm Number of attempts: 1 Airway Equipment and Method: Stylet Placement Confirmation: ETT inserted through vocal cords under direct vision,  positive ETCO2 and breath sounds checked- equal and bilateral Secured at: 23 cm Tube secured with: Tape Dental Injury: Teeth and Oropharynx as per pre-operative assessment

## 2018-08-24 NOTE — Progress Notes (Signed)
Please see endoscopy report from today for details.  Discussed with Dr. Luisa Hart.  Basically, the stopcock which he ingested sometime ago is currently in the stomach and could not be removed despite prolonged efforts, whereas the pulse oximeter probe cover, plug, and wire were successfully removed.  I have ordered a clear liquid diet for the patient.  Assuming he remains stable, his diet could be advanced to regular food tomorrow.  It is unclear whether the plastic stopcock will pass from the stomach into the bowel, and if so, whether it will successfully traverse the intestinal tract or whether it will hang up at some point, potentially causing perforation of the intestine.  Per discussion with Dr. Luisa Hart, the current plan is to monitor the patient since reoperating at this time would be fraught with significant risk of problems and complications.  We will sign off.  However, please call us if we can be of further assistance in this patient's care.  Florencia Reasons, M.D. Pager [phone] If no answer or after 5 PM call [phone]

## 2018-08-24 NOTE — Transfer of Care (Signed)
Immediate Anesthesia Transfer of Care Note  Patient: David Mcconnell  Procedure(s) Performed: ESOPHAGOGASTRODUODENOSCOPY (EGD) WITH PROPOFOL (N/A )  Patient Location: PACU  Anesthesia Type:General  Level of Consciousness: awake, alert  and oriented  Airway & Oxygen Therapy: Patient Spontanous Breathing and Patient connected to nasal cannula oxygen  Post-op Assessment: Report given to RN and Post -op Vital signs reviewed and stable  Post vital signs: Reviewed and stable  Last Vitals:  Vitals Value Taken Time  BP 123/71 08/24/2018  1:49 PM  Temp    Pulse 61 08/24/2018  1:49 PM  Resp 14 08/24/2018  1:49 PM  SpO2 98 % 08/24/2018  1:49 PM  Vitals shown include unvalidated device data.  Last Pain:  Vitals:   08/24/18 1131  TempSrc: Oral  PainSc: 8       Patients Stated Pain Goal: 0 (08/21/18 7414)  Complications: No apparent anesthesia complications

## 2018-08-25 ENCOUNTER — Encounter (HOSPITAL_COMMUNITY): Payer: Self-pay | Admitting: Gastroenterology

## 2018-08-25 NOTE — Discharge Summary (Signed)
Physician Discharge Summary  Patient ID: David Mcconnell MRN: [id number] DOB/AGE: [birth year] 38 y.o.  PCP: Patient, No Pcp Per  Admit date: 08/14/2018 Discharge date: 08/25/2018  Admission Diagnoses:  Manual small bowel injury-self inflicted  Discharge Diagnoses:  same  Principal Problem:   Self-inflicted injury Active Problems:   S/P exploratory laparotomy   Surgery:  Repair of small bowel and retrieval of foreign body from stomach  Discharged Condition[facility]  H[facility] Course:   Had surgery by Dr. Cliffton Asters on 08/14/18.  Kept under sheriff watch.  Awaiting to pass the stopcock that was unable to be retrieved by Dr. Matthias Hughs.  They can do that at the jail.    Consults: GI  Significant Diagnostic Studies: xrays    Discharge Exam: Blood pressure 129/78, pulse 67, temperature 98.4 F (36.9 C), temperature source Oral, resp. rate 18, height 6\' 2"  (1.88 m), weight 90.7 kg, SpO2 100 %. Incision of abdomen with scars--staples removed  Disposition: Discharge disposition: [facility] Not Defined       Discharge Instructions    Diet - low sodium heart healthy   Complete by:  As directed    Discharge instructions   Complete by:  As directed    Change abdominal dressing daily with moist saline and abdominal binder  May take hydrocodone or tramadol for pain from jail pharmacy   Increase activity slowly   Complete by:  As directed      Allergies as of 08/25/2018      Reactions   Tegretol [carbamazepine] Rash, Other (See Comments)   Blisters in mouth   Tegretol [carbamazepine] Rash      Medication List    TAKE these medications   buPROPion 150 MG 24 hr tablet Commonly known as:  WELLBUTRIN XL Take 1 tablet (150 mg total) by mouth daily. What changed:  how much to take   lamoTRIgine 25 MG tablet Commonly known as:  LAMICTAL Take 25 mg by mouth 2 (two) times daily.   lurasidone 20 MG Tabs tablet Commonly known as:   LATUDA Take 20 mg by mouth daily with breakfast.      Follow-up Information    [facility] Follow up on 08/27/2018.   Why:  10:20am, arrive by 9:50am for paperwork and check in Contact information: [address] [place] [address] [phone]          Signed: Valarie Merino 08/25/2018, 11:07 AM

## 2018-08-25 NOTE — Progress Notes (Signed)
Pt discharge via officers.  AVS given to officer. VSS.  Pt is alert and oriented.

## 2018-08-26 ENCOUNTER — Encounter (HOSPITAL_COMMUNITY): Payer: Self-pay

## 2018-08-26 ENCOUNTER — Emergency Department (HOSPITAL_COMMUNITY)

## 2018-08-26 ENCOUNTER — Emergency Department (HOSPITAL_COMMUNITY)
Admission: EM | Admit: 2018-08-26 | Discharge: 2018-08-26 | Attending: Emergency Medicine | Admitting: Emergency Medicine

## 2018-08-26 ENCOUNTER — Other Ambulatory Visit: Payer: Self-pay

## 2018-08-26 DIAGNOSIS — F17210 Nicotine dependence, cigarettes, uncomplicated: Secondary | ICD-10-CM | POA: Insufficient documentation

## 2018-08-26 DIAGNOSIS — X58XXXA Exposure to other specified factors, initial encounter: Secondary | ICD-10-CM | POA: Diagnosis not present

## 2018-08-26 DIAGNOSIS — T8130XA Disruption of wound, unspecified, initial encounter: Secondary | ICD-10-CM

## 2018-08-26 DIAGNOSIS — Y92149 Unspecified place in prison as the place of occurrence of the external cause: Secondary | ICD-10-CM | POA: Diagnosis not present

## 2018-08-26 DIAGNOSIS — Z79899 Other long term (current) drug therapy: Secondary | ICD-10-CM | POA: Diagnosis not present

## 2018-08-26 DIAGNOSIS — Y658 Other specified misadventures during surgical and medical care: Secondary | ICD-10-CM | POA: Insufficient documentation

## 2018-08-26 DIAGNOSIS — F329 Major depressive disorder, single episode, unspecified: Secondary | ICD-10-CM | POA: Insufficient documentation

## 2018-08-26 DIAGNOSIS — Y999 Unspecified external cause status: Secondary | ICD-10-CM | POA: Diagnosis not present

## 2018-08-26 DIAGNOSIS — Y9389 Activity, other specified: Secondary | ICD-10-CM | POA: Diagnosis not present

## 2018-08-26 DIAGNOSIS — T189XXA Foreign body of alimentary tract, part unspecified, initial encounter: Secondary | ICD-10-CM | POA: Diagnosis not present

## 2018-08-26 DIAGNOSIS — Z7289 Other problems related to lifestyle: Secondary | ICD-10-CM

## 2018-08-26 DIAGNOSIS — T8131XA Disruption of external operation (surgical) wound, not elsewhere classified, initial encounter: Secondary | ICD-10-CM | POA: Diagnosis not present

## 2018-08-26 DIAGNOSIS — IMO0002 Reserved for concepts with insufficient information to code with codable children: Secondary | ICD-10-CM

## 2018-08-26 LAB — BASIC METABOLIC PANEL
Anion gap: 8 (ref 5–15)
BUN: 9 mg/dL (ref 6–20)
CO2: 25 mmol/L (ref 22–32)
Calcium: 9.5 mg/dL (ref 8.9–10.3)
Chloride: 107 mmol/L (ref 98–111)
Creatinine, Ser: 1.05 mg/dL (ref 0.61–1.24)
GFR calc Af Amer: >60 mL/min (ref >60)
GFR calc non Af Amer: >60 mL/min (ref >60)
Glucose, Bld: 96 mg/dL (ref 70–99)
POTASSIUM: 4.3 mmol/L (ref 3.5–5.1)
Sodium: 140 mmol/L (ref 135–145)

## 2018-08-26 LAB — CBC WITH DIFFERENTIAL/PLATELET
Abs Immature Granulocytes: 0.01 10*3/uL (ref 0.00–0.07)
Basophils Absolute: 0 10*3/uL (ref 0.0–0.1)
Basophils Relative: 1 %
Eosinophils Absolute: 0.3 10*3/uL (ref 0.0–0.5)
Eosinophils Relative: 5 %
HCT: 38.8 % — ABNORMAL LOW (ref 39.0–52.0)
Hemoglobin: 10.9 g/dL — ABNORMAL LOW (ref 13.0–17.0)
Immature Granulocytes: 0 %
Lymphocytes Relative: 22 %
Lymphs Abs: 1.2 10*3/uL (ref 0.7–4.0)
MCH: 22.9 pg — ABNORMAL LOW (ref 26.0–34.0)
MCHC: 28.1 g/dL — ABNORMAL LOW (ref 30.0–36.0)
MCV: 81.3 fL (ref 80.0–100.0)
MONO ABS: 0.3 10*3/uL (ref 0.1–1.0)
Monocytes Relative: 5 %
NEUTROS ABS: 3.5 10*3/uL (ref 1.7–7.7)
Neutrophils Relative %: 67 %
Platelets: 334 10*3/uL (ref 150–400)
RBC: 4.77 MIL/uL (ref 4.22–5.81)
RDW: 15.4 % (ref 11.5–15.5)
WBC: 5.4 10*3/uL (ref 4.0–10.5)
nRBC: 0 % (ref 0.0–0.2)

## 2018-08-26 MED ORDER — SODIUM CHLORIDE (PF) 0.9 % IJ SOLN
INTRAMUSCULAR | Status: AC
  Filled 2018-08-26: qty 50

## 2018-08-26 MED ORDER — IOPAMIDOL (ISOVUE-300) INJECTION 61%
100.0000 mL | Freq: Once | INTRAVENOUS | Status: AC | PRN
  Administered 2018-08-26: 100 mL via INTRAVENOUS

## 2018-08-26 MED ORDER — IOPAMIDOL (ISOVUE-300) INJECTION 61%
INTRAVENOUS | Status: AC
  Filled 2018-08-26: qty 100

## 2018-08-26 MED ORDER — FENTANYL CITRATE (PF) 100 MCG/2ML IJ SOLN
100.0000 ug | Freq: Once | INTRAMUSCULAR | Status: AC
  Administered 2018-08-26: 100 ug via INTRAVENOUS
  Filled 2018-08-26: qty 2

## 2018-08-26 MED ORDER — KETOROLAC TROMETHAMINE 30 MG/ML IJ SOLN
30.0000 mg | Freq: Once | INTRAMUSCULAR | Status: AC
  Administered 2018-08-26: 30 mg via INTRAVENOUS
  Filled 2018-08-26: qty 1

## 2018-08-26 MED ORDER — FENTANYL CITRATE (PF) 100 MCG/2ML IJ SOLN: 100.0000 ug | Freq: Once | INTRAMUSCULAR | Status: DC

## 2018-08-26 NOTE — ED Triage Notes (Addendum)
Pt BIBA from jail, in police custody.  Pt reports was in holding area of jail, pulled surgical incision on central abdomen open with link from handcuffs.   Pt in handcuffs and mitts from jail on arrival.  Pt handcuffed to hospital gurney at [facility] wrists and ankles. Pt AOx4.

## 2018-08-26 NOTE — ED Notes (Addendum)
This RN at bedside with MD to remove length of gauze and partial abdominal dressing and tape from pts abdomen. Then abdominal wound was redressed with wet to dry dressing, covered with abdominal pad.

## 2018-08-26 NOTE — ED Notes (Signed)
Pt denies SI, HI.  Stated that was trying to "remove cock stop that I swallowed, I've had 17 abdominal surgeries in the past, I know I couldn't pass it.  It opened and I pulled a bubble out, and popped it."

## 2018-08-26 NOTE — ED Notes (Signed)
AC called requesting sitter ordered discontinued since pt does not meet criteria for sitter; see ED note 1330.
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
## 2018-08-26 NOTE — ED Notes (Signed)
Per EMT, officers at bedside called staff into room related to pt pushing his dressing further into his surgical site. Provider notified. While provider at bedside assessing the pt, this writer reassessed pt's risk for self harm related to event. Pt nods yes to self harm verbalizing that his stomach hurts and that he is serving a lot of time. Provider at bedside for conversation had.   AC called and made aware of all the above; per [facility] sitter being sent.
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
## 2018-08-26 NOTE — ED Notes (Signed)
Bed: WA17 Expected date:  Expected time:  Means of arrival:  Comments: Per charge

## 2018-08-26 NOTE — ED Notes (Signed)
Sitter d/c at bedside d/t pt denial of SI/HI.  Police officers at bedside d/t pt being in custody.

## 2018-08-26 NOTE — ED Notes (Signed)
Discharge paperwork reviewed with patient and officers present.  Pt verbalized understanding.  Pt did not have any questions or concerns at this time.  Pt discharged in police custody, in shackles.

## 2018-08-26 NOTE — ED Notes (Signed)
IV d/c per MD request.

## 2018-08-26 NOTE — ED Notes (Signed)
RN Karleen Hampshire entered pt room to find pt had 1 occurrence of emesis with scant amount of bright red blood.  MD Effie Shy made aware.

## 2018-08-26 NOTE — ED Notes (Signed)
Sitter at bedside.

## 2018-08-26 NOTE — ED Notes (Signed)
Attempted IV start x3, unsuccessful.  Will request [place] IV placement.

## 2018-08-26 NOTE — Discharge Instructions (Addendum)
Start with clear liquid diet, then gradually advance to regular foods as tolerated.  Watch for signs of complications from ingested wire in his stomach.  These can include pain, vomiting, bloody emesis, lack of stooling, abdominal distention, fever or shortness of breath.  For the abdominal wound, change the dressing daily initially using saline wet-to-dry bandages, then advance to dry dressings, as the wound continues to heal.  Call the general surgery practice for concerns about wound complications.  They can set up an appointment to see him in their office.  Please have David Mcconnell see the psychiatrist at the jail to work on his feelings of despair and depression.  Return here if needed for emergency problems.

## 2018-08-26 NOTE — ED Notes (Addendum)
This RN called to room by one of police officers.  This RN entered room at which point officer stated that he witnessed pt chewing and subsequently swallowing partial portion of pulse oximetry lead.  Onehalf of pulse oximetry observed to be on the floor, with wire severed.  At this time, pt admitted to eating 1 whole pulse oximetry lead prior to going to CT.  Jail personnel/officers have been in room with pt entire time pt has been in room.   This RN removed all cords from pt including BP cuff and cord, any remaining cardiac tele leads.   MD made aware of above.
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
## 2019-06-20 DEATH — deceased

## 2020-05-07 IMAGING — DX DG CHEST 1V PORT
1 series · 1 of 1 positions shown · non-contrast
Comparison: 04/11/2010

CLINICAL DATA: Drug overdose.  Vomiting.

EXAM:
PORTABLE CHEST 1 VIEW

[chest ap]
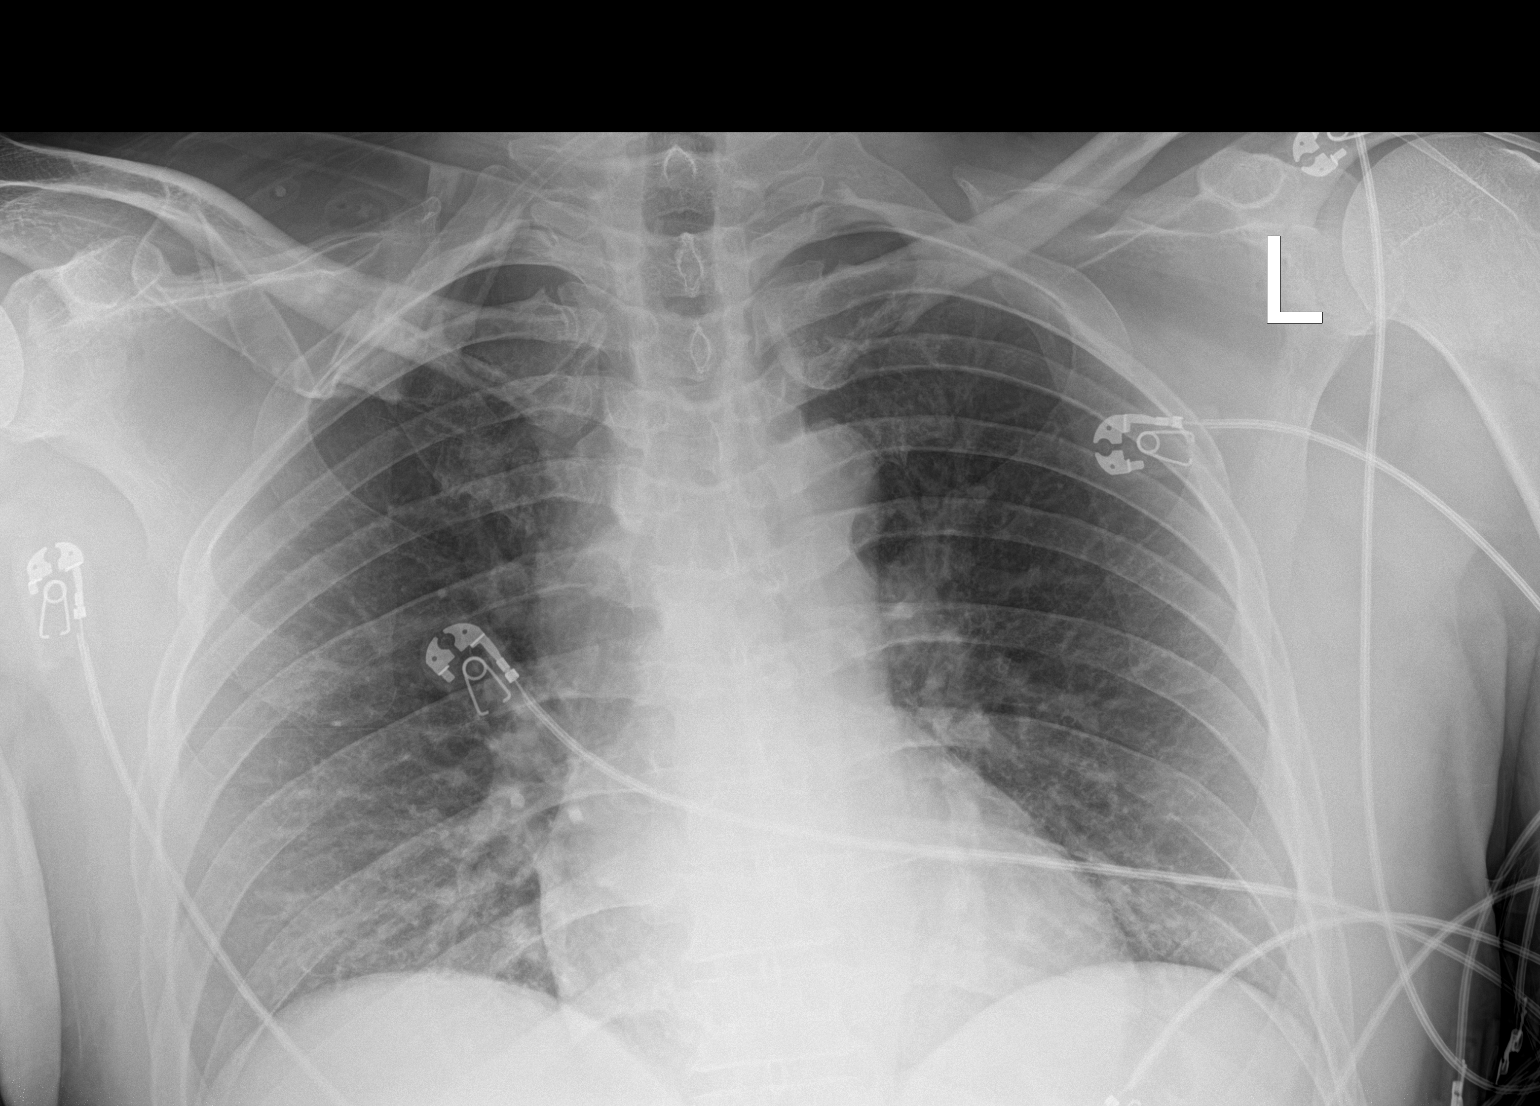

[1 of 1 positions shown; findings below may reference images not displayed]

FINDINGS: The heart size and mediastinal contours are within normal limits.
Both lungs are clear. The visualized skeletal structures are
unremarkable.
IMPRESSION: No active disease.

## 2020-09-30 IMAGING — RF DG UGI W/O KUB
12 of 14 series · 13 of 24 positions shown · IV contrast (omnipaque)
Comparison: Neck CT 08/20/2018

CLINICAL DATA: Post upper endoscopy for removal of foreign bodies.
Concern for esophageal rupture.

EXAM:
WATER SOLUBLE UPPER GI SERIES
TECHNIQUE: Single-column upper GI series was performed using water soluble
contrast.
CONTRAST:  300mL OMNIPAQUE IOHEXOL 300 MG/ML  SOLN

[Series 1: t abdomen supine · 0.15mm/px · 1 of 1 slices shown]
[im 1/1]
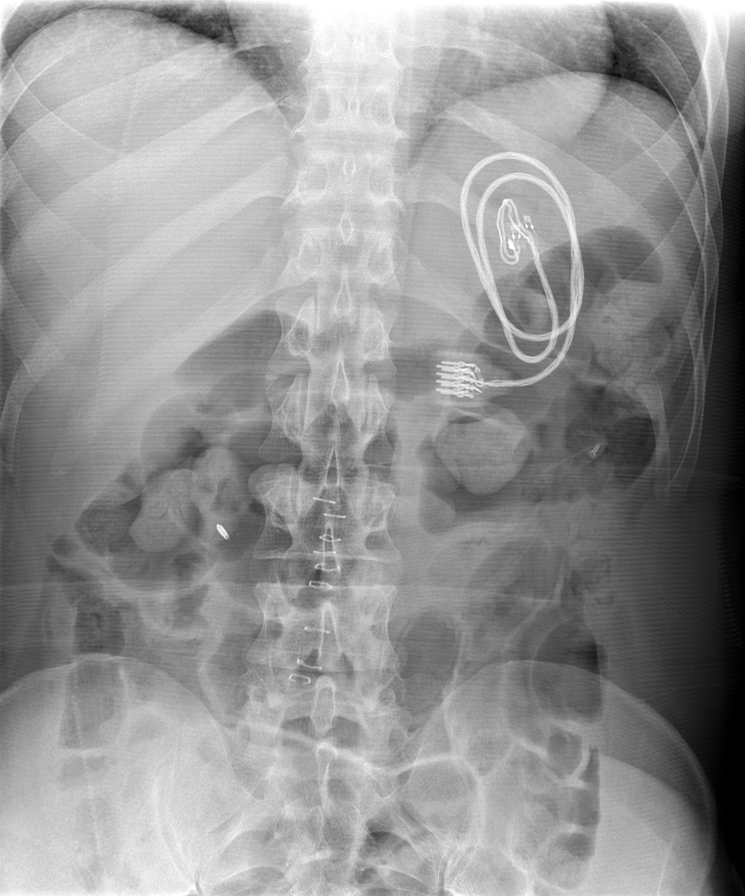

[Series 3: cp_standard · 0.56mm/px · 1 of 79 frames shown (1 of 9)]
[frame 40/79]
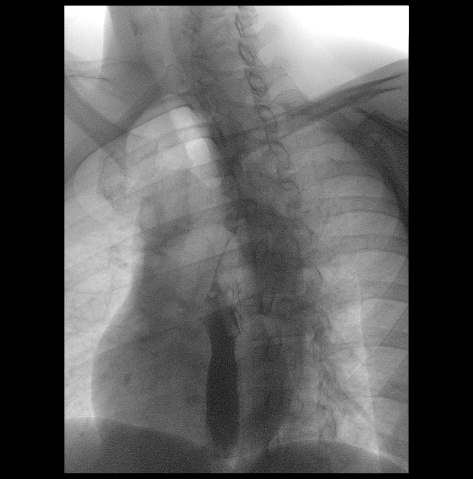

[Series 4: cp_standard · 0.56mm/px · 1 of 272 frames shown (2 of 9)]
[frame 137/272]
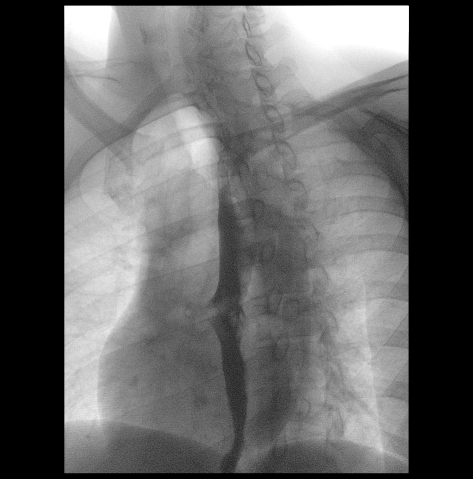

[Series 6: cp_standard · 0.54mm/px · 1 of 210 frames shown (3 of 9)]
[frame 32/210]
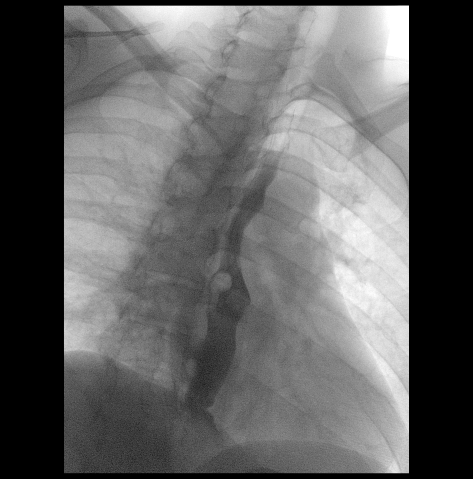

[Series 7: cp_standard · 0.54mm/px · 1 of 356 frames shown (4 of 9)]
[frame 54/356]
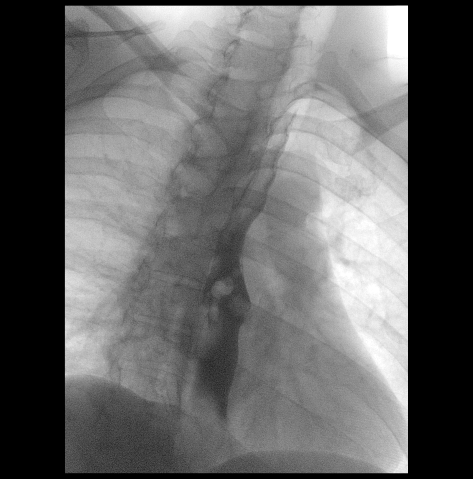

[Series 8: cp_standard · 0.54mm/px · 2 of 168 frames shown (5 of 9)]
[frame 19/168]
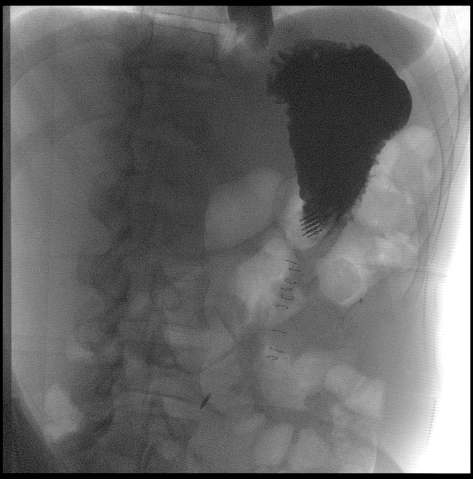
[frame 143/168]
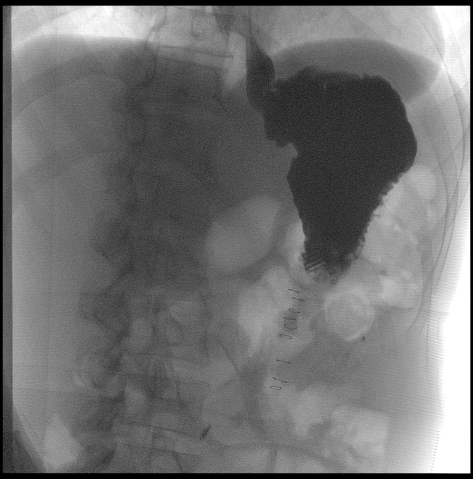

[Series 9: cp_standard · 0.54mm/px · 1 of 7 frames shown (6 of 9)]
[frame 4/7]
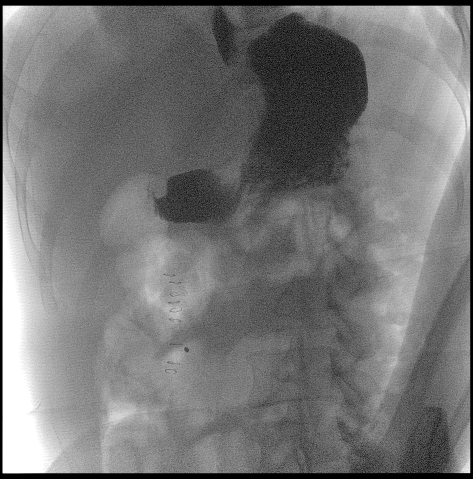

[Series 11: cp_standard · 0.27mm/px · 1 of 1 slices shown (7 of 9)]
[im 1/1]
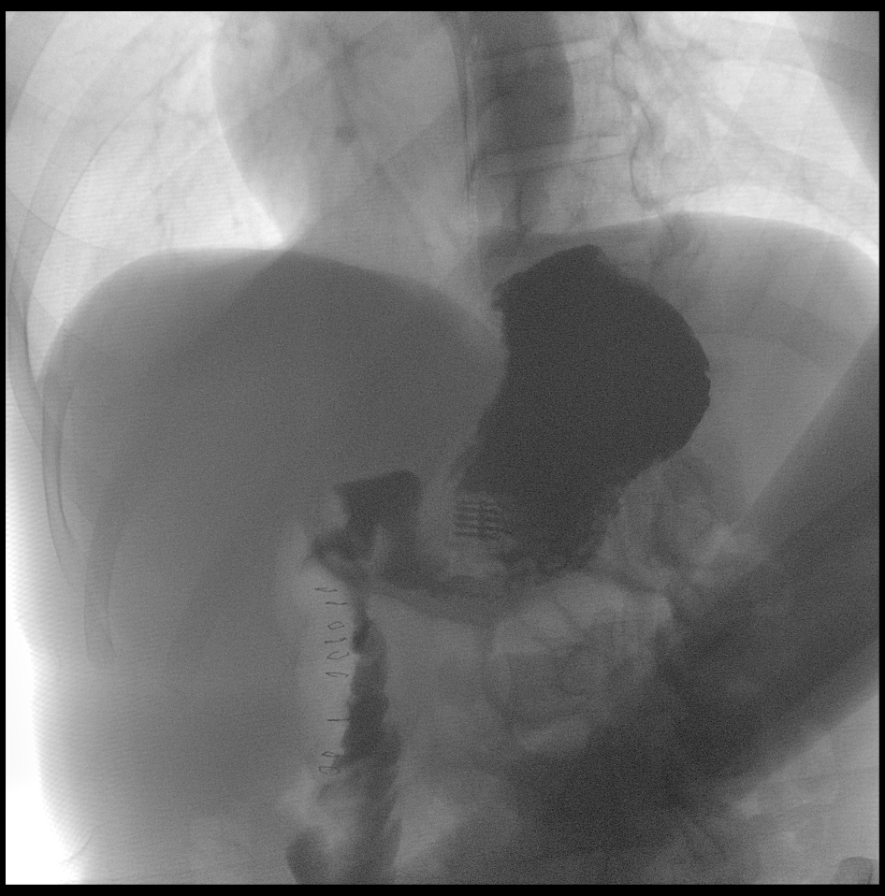

[Series 13: cp_standard · 0.55mm/px · 1 of 10 frames shown (8 of 9)]
[frame 9/10]
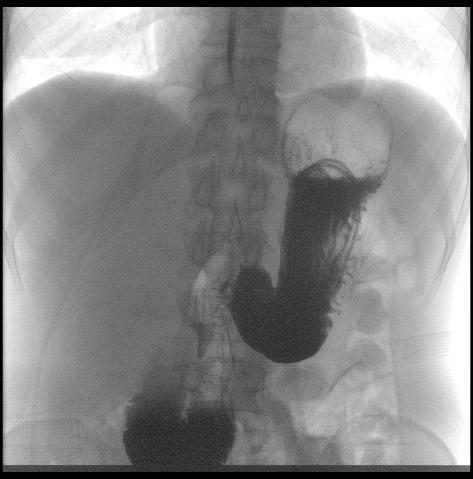

[Series 15: cp_standard · 0.53mm/px · 1 of 625 frames shown (9 of 9)]
[frame 95/625]
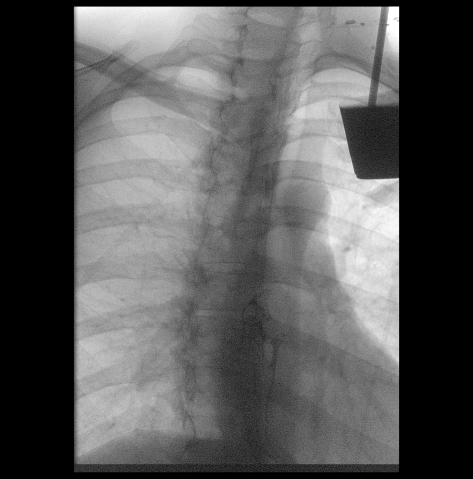

[Series 16: fluoro_barium singleshot_bw · 0.18mm/px · 1 of 1 slices shown (1 of 2)]
[im 1/1]
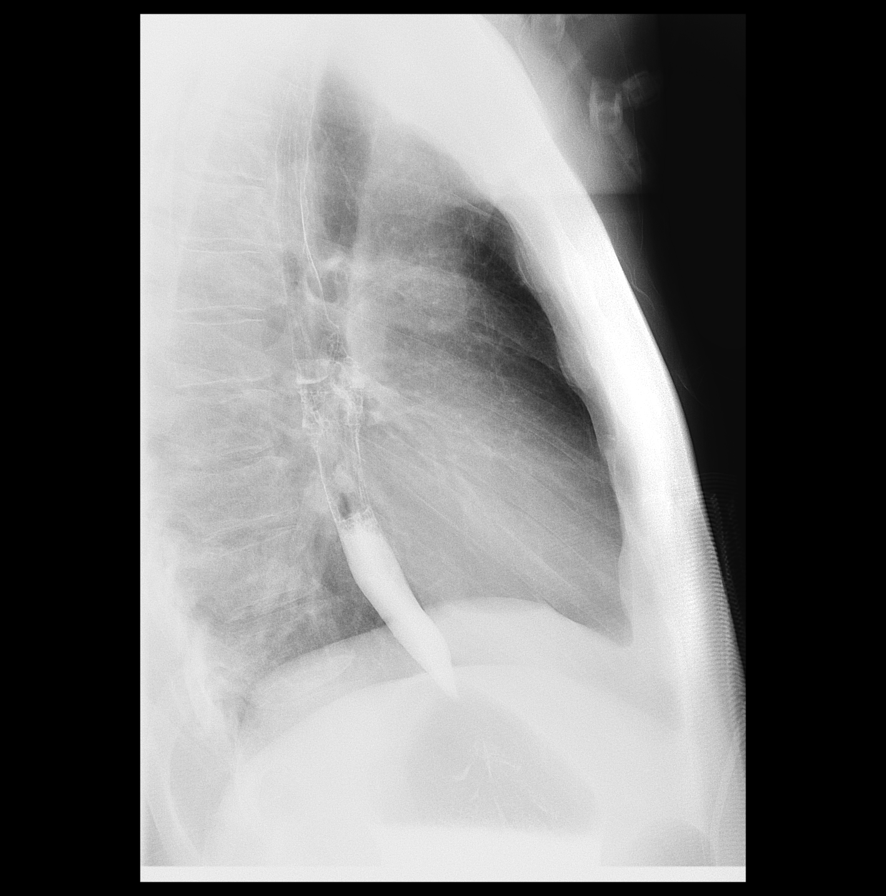

[Series 20: fluoro_barium singleshot_bw · 0.18mm/px · 1 of 1 slices shown (2 of 2)]
[im 1/1]
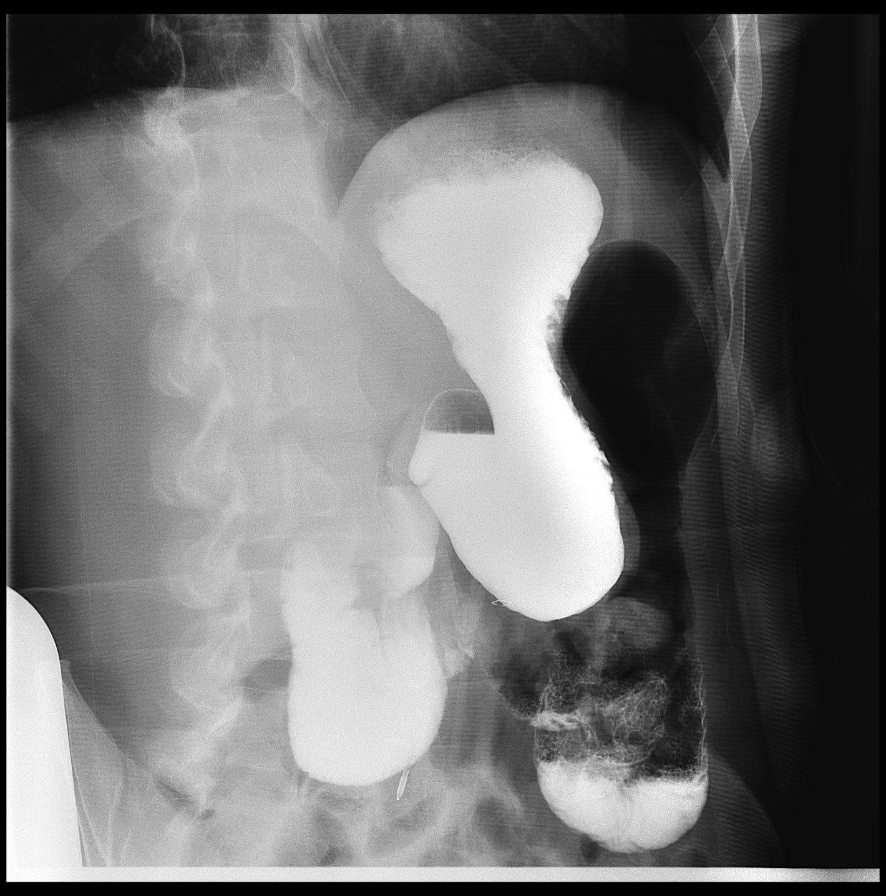

[13 of 24 positions shown; findings below may reference images not displayed]

FLUOROSCOPY TIME:  Fluoroscopy Time:  3 minutes

Radiation Exposure Index (if provided by the fluoroscopic device):
33.2 mGy

Number of Acquired Spot Images: 4
FINDINGS: The hypopharynx demonstrated no filling defects and was normal in
contour. The esophagus was smooth in contour and demonstrated normal
motility. There was no narrowing or stricture. There are however 2
or 3 radiolucent foreign bodies within the mid to distal esophagus,
approximately 14 cm distal to the thoracic inlet. The foreign bodies
measure between 11 and 16 mm and demonstrate no change in position
after swallowing contrast. The contrast is able to pass through the
affected part of the esophagus, which appears slightly dilated.

There was no hiatal hernia. Mild spontaneous gastroesophageal
reflux.
IMPRESSION: There are 2 or 3 radiolucent foreign bodies within the mid to distal
esophagus, approximately 14 cm distal to the thoracic inlet. The
contrast is able to pass through the affected part of the esophagus,
which appears slightly dilated. The foreign bodies did not change
position upon swallowing of contrast.

No extraluminal extravasation was documented.

Images of the stomach demonstrate the known foreign body (pulse
oximeter) to be coiled within the gastric body. Contrast freely
passes past the foreign body into the duodenum. The mucosal pattern
of the stomach and duodenum are grossly normal. No extraluminal
extravasation was noted.
